# Patient Record
Sex: Female | Born: 1955
Health system: Southern US, Community
[De-identification: ages and names within clinical notes are randomized; demographics above are authoritative.]

## PROBLEM LIST (undated history)

## (undated) DIAGNOSIS — I10 Essential (primary) hypertension: Secondary | ICD-10-CM

## (undated) DIAGNOSIS — Z9889 Other specified postprocedural states: Secondary | ICD-10-CM

## (undated) DIAGNOSIS — M25511 Pain in right shoulder: Secondary | ICD-10-CM

## (undated) DIAGNOSIS — I2510 Atherosclerotic heart disease of native coronary artery without angina pectoris: Secondary | ICD-10-CM

## (undated) DIAGNOSIS — R112 Nausea with vomiting, unspecified: Secondary | ICD-10-CM

## (undated) HISTORY — DX: Essential (primary) hypertension: I10

## (undated) HISTORY — DX: Pain in right shoulder: M25.511

## (undated) HISTORY — PX: CARDIAC CATHETERIZATION: SHX172

---

## 1978-05-18 HISTORY — PX: VAGINAL HYSTERECTOMY: SUR661

## 1996-05-18 HISTORY — PX: TOE SURGERY: SHX1073

## 1998-05-18 HISTORY — PX: ANKLE SURGERY: SHX546

## 1998-05-22 ENCOUNTER — Ambulatory Visit (HOSPITAL_BASED_OUTPATIENT_CLINIC_OR_DEPARTMENT_OTHER): Admission: RE | Admit: 1998-05-22 | Discharge: 1998-05-22 | Payer: Self-pay | Admitting: Orthopedic Surgery

## 2002-08-15 ENCOUNTER — Encounter: Payer: Self-pay | Admitting: Internal Medicine

## 2002-08-15 ENCOUNTER — Encounter: Admission: RE | Admit: 2002-08-15 | Discharge: 2002-08-15 | Payer: Self-pay | Admitting: Internal Medicine

## 2004-08-25 ENCOUNTER — Encounter: Admission: RE | Admit: 2004-08-25 | Discharge: 2004-08-25 | Payer: Self-pay | Admitting: Internal Medicine

## 2005-02-20 ENCOUNTER — Ambulatory Visit: Payer: Self-pay | Admitting: Internal Medicine

## 2005-04-10 ENCOUNTER — Ambulatory Visit: Payer: Self-pay | Admitting: Internal Medicine

## 2005-08-26 ENCOUNTER — Encounter: Admission: RE | Admit: 2005-08-26 | Discharge: 2005-08-26 | Payer: Self-pay | Admitting: Internal Medicine

## 2006-03-02 ENCOUNTER — Ambulatory Visit: Payer: Self-pay | Admitting: Internal Medicine

## 2006-03-02 LAB — CONVERTED CEMR LAB
AST: 22 units/L (ref 0–37)
Albumin: 3.6 g/dL (ref 3.5–5.2)
Basophils Absolute: 0.1 10*3/uL (ref 0.0–0.1)
CO2: 27 meq/L (ref 19–32)
Creatinine, Ser: 0.9 mg/dL (ref 0.4–1.2)
Eosinophil percent: 1.7 % (ref 0.0–5.0)
Glomerular Filtration Rate, Af Am: 86 mL/min/{1.73_m2}
HCT: 36.8 % (ref 36.0–46.0)
HDL: 47.8 mg/dL (ref >39.0)
Hemoglobin: 12.5 g/dL (ref 12.0–15.0)
LDL Cholesterol: 94 mg/dL (ref 0–99)
MCHC: 33.9 g/dL (ref 30.0–36.0)
Monocytes Absolute: 0.4 10*3/uL (ref 0.2–0.7)
Neutro Abs: 2.8 10*3/uL (ref 1.4–7.7)
Neutrophils Relative %: 38.4 % — ABNORMAL LOW (ref 43.0–77.0)
RBC: 4.31 M/uL (ref 3.87–5.11)
Sodium: 140 meq/L (ref 135–145)
TSH: 1.38 microintl units/mL (ref 0.35–5.50)
Total Bilirubin: 0.7 mg/dL (ref 0.3–1.2)
Total Protein: 7.5 g/dL (ref 6.0–8.3)
Triglyceride fasting, serum: 174 mg/dL — ABNORMAL HIGH (ref 0–149)
VLDL: 35 mg/dL (ref 0–40)

## 2006-03-29 ENCOUNTER — Ambulatory Visit: Payer: Self-pay | Admitting: Internal Medicine

## 2006-05-04 ENCOUNTER — Ambulatory Visit: Payer: Self-pay | Admitting: Internal Medicine

## 2006-06-15 ENCOUNTER — Ambulatory Visit: Payer: Self-pay | Admitting: Internal Medicine

## 2006-12-06 ENCOUNTER — Ambulatory Visit: Payer: Self-pay | Admitting: Internal Medicine

## 2006-12-06 DIAGNOSIS — I10 Essential (primary) hypertension: Secondary | ICD-10-CM | POA: Insufficient documentation

## 2006-12-06 DIAGNOSIS — J309 Allergic rhinitis, unspecified: Secondary | ICD-10-CM | POA: Insufficient documentation

## 2007-01-28 ENCOUNTER — Ambulatory Visit: Payer: Self-pay | Admitting: Internal Medicine

## 2007-01-28 DIAGNOSIS — M25511 Pain in right shoulder: Secondary | ICD-10-CM | POA: Insufficient documentation

## 2007-02-07 ENCOUNTER — Encounter: Payer: Self-pay | Admitting: Internal Medicine

## 2007-02-28 ENCOUNTER — Encounter: Payer: Self-pay | Admitting: Internal Medicine

## 2007-04-21 ENCOUNTER — Telehealth: Payer: Self-pay | Admitting: Internal Medicine

## 2007-09-15 ENCOUNTER — Ambulatory Visit: Payer: Self-pay | Admitting: Internal Medicine

## 2007-12-15 ENCOUNTER — Ambulatory Visit: Payer: Self-pay | Admitting: Internal Medicine

## 2008-01-27 ENCOUNTER — Ambulatory Visit: Payer: Self-pay | Admitting: Internal Medicine

## 2008-05-31 ENCOUNTER — Encounter: Admission: RE | Admit: 2008-05-31 | Discharge: 2008-05-31 | Payer: Self-pay | Admitting: Internal Medicine

## 2008-06-07 ENCOUNTER — Ambulatory Visit: Payer: Self-pay | Admitting: Internal Medicine

## 2008-07-23 ENCOUNTER — Ambulatory Visit: Payer: Self-pay | Admitting: Internal Medicine

## 2008-07-23 LAB — CONVERTED CEMR LAB
AST: 17 units/L (ref 0–37)
Alkaline Phosphatase: 83 units/L (ref 39–117)
BUN: 9 mg/dL (ref 6–23)
Basophils Relative: 0.1 % (ref 0.0–3.0)
CO2: 31 meq/L (ref 19–32)
Cholesterol: 163 mg/dL (ref 0–200)
Direct LDL: 59 mg/dL
Eosinophils Absolute: 0.1 10*3/uL (ref 0.0–0.7)
GFR calc Af Amer: 98 mL/min
GFR calc non Af Amer: 81 mL/min
Glucose, Bld: 104 mg/dL — ABNORMAL HIGH (ref 70–99)
HCT: 36.9 % (ref 36.0–46.0)
Ketones, urine, test strip: NEGATIVE
MCHC: 34.6 g/dL (ref 30.0–36.0)
Monocytes Relative: 6.3 % (ref 3.0–12.0)
Neutrophils Relative %: 43.6 % (ref 43.0–77.0)
Nitrite: NEGATIVE
Potassium: 3.2 meq/L — ABNORMAL LOW (ref 3.5–5.1)
Protein, U semiquant: NEGATIVE
RDW: 12.2 % (ref 11.5–14.6)
Specific Gravity, Urine: 1.015
Total Bilirubin: 0.7 mg/dL (ref 0.3–1.2)
Total Protein: 7.4 g/dL (ref 6.0–8.3)
Triglycerides: 207 mg/dL (ref 0–149)
VLDL: 41 mg/dL — ABNORMAL HIGH (ref 0–40)
WBC Urine, dipstick: NEGATIVE
pH: 6

## 2008-07-27 ENCOUNTER — Ambulatory Visit: Payer: Self-pay | Admitting: Internal Medicine

## 2008-09-28 ENCOUNTER — Telehealth: Payer: Self-pay | Admitting: Internal Medicine

## 2008-09-28 ENCOUNTER — Telehealth: Payer: Self-pay | Admitting: Family Medicine

## 2008-10-01 ENCOUNTER — Ambulatory Visit: Payer: Self-pay | Admitting: Internal Medicine

## 2008-10-01 DIAGNOSIS — J069 Acute upper respiratory infection, unspecified: Secondary | ICD-10-CM | POA: Insufficient documentation

## 2008-10-08 ENCOUNTER — Ambulatory Visit: Payer: Self-pay | Admitting: Internal Medicine

## 2008-10-08 ENCOUNTER — Telehealth: Payer: Self-pay | Admitting: Family Medicine

## 2008-10-16 ENCOUNTER — Ambulatory Visit: Payer: Self-pay | Admitting: Internal Medicine

## 2008-10-22 ENCOUNTER — Telehealth: Payer: Self-pay | Admitting: Internal Medicine

## 2009-02-19 ENCOUNTER — Telehealth: Payer: Self-pay | Admitting: Internal Medicine

## 2009-04-18 ENCOUNTER — Ambulatory Visit: Payer: Self-pay | Admitting: Internal Medicine

## 2009-07-02 ENCOUNTER — Ambulatory Visit: Payer: Self-pay | Admitting: Internal Medicine

## 2009-07-04 ENCOUNTER — Telehealth: Payer: Self-pay | Admitting: Internal Medicine

## 2009-07-12 ENCOUNTER — Telehealth: Payer: Self-pay | Admitting: Family Medicine

## 2009-07-15 ENCOUNTER — Ambulatory Visit: Payer: Self-pay | Admitting: Internal Medicine

## 2009-08-02 ENCOUNTER — Encounter: Admission: RE | Admit: 2009-08-02 | Discharge: 2009-08-02 | Payer: Self-pay | Admitting: Internal Medicine

## 2009-08-02 LAB — HM MAMMOGRAPHY

## 2009-11-14 ENCOUNTER — Ambulatory Visit: Payer: Self-pay | Admitting: Internal Medicine

## 2009-12-09 ENCOUNTER — Ambulatory Visit: Payer: Self-pay | Admitting: Internal Medicine

## 2009-12-09 DIAGNOSIS — T6391XA Toxic effect of contact with unspecified venomous animal, accidental (unintentional), initial encounter: Secondary | ICD-10-CM | POA: Insufficient documentation

## 2010-03-19 ENCOUNTER — Telehealth: Payer: Self-pay | Admitting: Internal Medicine

## 2010-05-01 ENCOUNTER — Ambulatory Visit: Payer: Self-pay | Admitting: Internal Medicine

## 2010-06-08 ENCOUNTER — Encounter: Payer: Self-pay | Admitting: Internal Medicine

## 2010-06-17 NOTE — Progress Notes (Signed)
Summary: refill on Hydrocodone  Phone Note Call from Patient   Caller: Patient Call For: Gordy Savers  MD Summary of Call: Cough syrup refill from sinus congestion. [phone] [facility])  Initial call taken by: Lynann Beaver CMA AAMA,  March 19, 2010 9:13 AM  Follow-up for Phone Call        generic hydromet 6 oz one tsp every  6 hrs for cough Follow-up by: Gordy Savers  MD,  March 19, 2010 9:31 AM    Prescriptions: Romana Juniper 5-1.5 MG/5ML SYRP (HYDROCODONE-HOMATROPINE) 2 teaspoons every 6 hours as needed for cough  #6 oz x 0   Entered by:   Lynann Beaver CMA AAMA   Authorized by:   Gordy Savers  MD   Signed by:   Lynann Beaver CMA AAMA on 03/19/2010   Method used:   Telephoned to ...         RxID:   [id number]

## 2010-06-17 NOTE — Assessment & Plan Note (Signed)
Summary: sob/dm   Vital Signs:  Patient profile:   55 year old female Weight:      212 pounds O2 Sat:      98 % on Room air Temp:     98.4 degrees F oral BP sitting:   118 / 70  (right arm) Cuff size:   large  Vitals Entered By: Duard Brady LPN (July 15, 2009 10:40 AM)  O2 Flow:  Room air CC: c/o sob , cought, congestion, no improvement Is Patient Diabetic? No   CC:  c/o sob , cought, congestion, and no improvement.  History of Present Illness: 55 year old patient who is seen today for follow-up.  She was seen for a URI.  Two weeks ago and treated symptomatically.  Last week.  She called and was placed on azithromycin due to persistent cough.  She denies any fever or sputum production.  She does have a history of hypertension and allergic rhinitis.  Denies any shortness of breath or chest pain.  Preventive Screening-Counseling & Management  Alcohol-Tobacco     Smoking Status: never  Allergies: 1)  ! Sulfa  Family History: Reviewed history from 07/27/2008 and no changes required. Family History of CAD Female 1st degree relative <50 Family History Hypertension  two brothers one status post CVA at age 20;  positive for hypertension, hyperlipidemia  Review of Systems       The patient complains of prolonged cough.  The patient denies anorexia, fever, weight loss, weight gain, vision loss, decreased hearing, hoarseness, chest pain, syncope, dyspnea on exertion, peripheral edema, headaches, hemoptysis, abdominal pain, melena, severe indigestion/heartburn, hematuria, incontinence, genital sores, muscle weakness, suspicious skin lesions, transient blindness, difficulty walking, depression, unusual weight change, abnormal bleeding, enlarged lymph nodes, angioedema, and breast masses.    Physical Exam  General:  Well-developed,well-nourished,in no acute distress; alert,appropriate and cooperative throughout examination Head:  Normocephalic and atraumatic without obvious  abnormalities. No apparent alopecia or balding. Eyes:  No corneal or conjunctival inflammation noted. EOMI. Perrla. Funduscopic exam benign, without hemorrhages, exudates or papilledema. Vision grossly normal. Ears:  External ear exam shows no significant lesions or deformities.  Otoscopic examination reveals clear canals, tympanic membranes are intact bilaterally without bulging, retraction, inflammation or discharge. Hearing is grossly normal bilaterally. Nose:  External nasal examination shows no deformity or inflammation. Nasal mucosa are pink and moist without lesions or exudates. Mouth:  Oral mucosa and oropharynx without lesions or exudates.  Teeth in good repair. Neck:  No deformities, masses, or tenderness noted. Lungs:  Normal respiratory effort, chest expands symmetrically. Lungs are clear to auscultation, no crackles or wheezes. O2 saturation 98% Heart:  Normal rate and regular rhythm. S1 and S2 normal without gallop, murmur, click, rub or other extra sounds.   Impression & Recommendations:  Problem # 1:  URI (ICD-465.9)  Her updated medication list for this problem includes:    Allegra 180 Mg Tabs (Fexofenadine hcl) .Marland Kitchen... 1 once daily    Naproxen Dr 500 Mg Tbec (Naproxen) ..... One twice daily    Hydrocodone-homatropine 5-1.5 Mg/63ml Syrp (Hydrocodone-homatropine) .Marland Kitchen... 2 teaspoons every 6 hours as needed for cough  Her updated medication list for this problem includes:    Allegra 180 Mg Tabs (Fexofenadine hcl) .Marland Kitchen... 1 once daily    Naproxen Dr 500 Mg Tbec (Naproxen) ..... One twice daily    Hydrocodone-homatropine 5-1.5 Mg/43ml Syrp (Hydrocodone-homatropine) .Marland Kitchen... 2 teaspoons every 6 hours as needed for cough  Problem # 2:  HYPERTENSION (ICD-401.9)  Her updated medication list for  this problem includes:    Dyazide 37.5-25 Mg Caps (Triamterene-hctz) .Marland Kitchen... 1 qd  Her updated medication list for this problem includes:    Dyazide 37.5-25 Mg Caps (Triamterene-hctz) .Marland Kitchen... 1  qd  Problem # 3:  ALLERGIC RHINITIS (ICD-477.9)  Her updated medication list for this problem includes:    Nasacort Aq 55 Mcg/act Aers (Triamcinolone acetonide(nasal)) ..... Use once daily    Allegra 180 Mg Tabs (Fexofenadine hcl) .Marland Kitchen... 1 once daily  Her updated medication list for this problem includes:    Nasacort Aq 55 Mcg/act Aers (Triamcinolone acetonide(nasal)) ..... Use once daily    Allegra 180 Mg Tabs (Fexofenadine hcl) .Marland Kitchen... 1 once daily  Complete Medication List: 1)  Nasacort Aq 55 Mcg/act Aers (Triamcinolone acetonide(nasal)) .... Use once daily 2)  Premarin 0.625 Mg Tabs (Estrogens conjugated) .Marland Kitchen.. 1 qd 3)  Dyazide 37.5-25 Mg Caps (Triamterene-hctz) .Marland Kitchen.. 1 qd 4)  Allegra 180 Mg Tabs (Fexofenadine hcl) .Marland Kitchen.. 1 once daily 5)  Naproxen Dr 500 Mg Tbec (Naproxen) .... One twice daily 6)  Ed K+10 10 Meq Cr-tabs (Potassium chloride) .... One daily 7)  Hydrocodone-homatropine 5-1.5 Mg/20ml Syrp (Hydrocodone-homatropine) .... 2 teaspoons every 6 hours as needed for cough 8)  Prednisone 20 Mg Tabs (Prednisone) .... One twice daily  Patient Instructions: 1)  Get plenty of rest, drink lots of clear liquids, and use Tylenol or Ibuprofen for fever and comfort. Return in 7-10 days if you're not better:sooner if you're feeling worse. Prescriptions: HYDROCODONE-HOMATROPINE 5-1.5 MG/5ML SYRP (HYDROCODONE-HOMATROPINE) 2 teaspoons every 6 hours as needed for cough  #6 oz x 2   Entered and Authorized by:   Gordy Savers  MD   Signed by:   Gordy Savers  MD on 07/15/2009   Method used:   Print then Give to Patient   RxID:   [id number]

## 2010-06-17 NOTE — Progress Notes (Signed)
Summary: cough   Phone Note Call from Patient Call back at Work Phone [phone]   Caller: Patient Call For: Gordy Savers  MD Summary of Call: C/o cough from allergies. Unable to take Hycodan at work; can you recommend daytime cough syrup? [facility] Initial call taken by: Raechel Ache, RN,  July 04, 2009 8:56 AM  Follow-up for Phone Call        delsym  one tsp every 12 hrs Follow-up by: Gordy Savers  MD,  July 04, 2009 12:53 PM  Additional Follow-up for Phone Call Additional follow up Details #1::        Phone Call Completed Additional Follow-up by: Raechel Ache, RN,  July 04, 2009 1:41 PM

## 2010-06-17 NOTE — Assessment & Plan Note (Signed)
Summary: sinuses//ccm   Vital Signs:  Patient profile:   55 year old female Weight:      212 pounds O2 Sat:      98 % on Room air Temp:     98.7 degrees F oral BP sitting:   110 / 76  (right arm) Cuff size:   regular  Vitals Entered By: Duard Brady LPN (July 02, 2009 10:53 AM)  O2 Flow:  Room air CC: c/o sinus congestion, cough , sob   CC:  c/o sinus congestion, cough , and sob.  History of Present Illness: 55 year old patient who has a history of allergic rhinitis, who presents today for two day history of increasing sinus congestion and rhinorrhea.  There is been no fever or purulent drainage.  She has a significant nasal stuffiness sneezing, and feels that she has had a flare of her allergic rhinitis.  She is on maintenance Allegra, as well as a nasal steroid.  Allergies: 1)  ! Sulfa  Past History:  Past Medical History: Reviewed history from 01/27/2008 and no changes required. Allergic rhinitis Hypertension right shoulder pain  Review of Systems  The patient denies anorexia, fever, weight loss, weight gain, vision loss, decreased hearing, hoarseness, chest pain, syncope, dyspnea on exertion, peripheral edema, prolonged cough, headaches, hemoptysis, abdominal pain, melena, hematochezia, severe indigestion/heartburn, hematuria, incontinence, genital sores, muscle weakness, suspicious skin lesions, transient blindness, difficulty walking, depression, unusual weight change, abnormal bleeding, enlarged lymph nodes, angioedema, and breast masses.    Physical Exam  General:  Well-developed,well-nourished,in no acute distress; alert,appropriate and cooperative throughout examination Eyes:  No corneal or conjunctival inflammation noted. EOMI. Perrla. Funduscopic exam benign, without hemorrhages, exudates or papilledema. Vision grossly normal. Ears:  External ear exam shows no significant lesions or deformities.  Otoscopic examination reveals clear canals, tympanic  membranes are intact bilaterally without bulging, retraction, inflammation or discharge. Hearing is grossly normal bilaterally. Mouth:  Oral mucosa and oropharynx without lesions or exudates.  Teeth in good repair. Neck:  No deformities, masses, or tenderness noted. Lungs:  Normal respiratory effort, chest expands symmetrically. Lungs are clear to auscultation, no crackles or wheezes. O2 saturation 99% Heart:  Normal rate and regular rhythm. S1 and S2 normal without gallop, murmur, click, rub or other extra sounds. pulse rate  110 Extremities:  No clubbing, cyanosis, edema, or deformity noted with normal full range of motion of all joints.     Impression & Recommendations:  Problem # 1:  ALLERGIC RHINITIS (ICD-477.9)  Her updated medication list for this problem includes:    Nasacort Aq 55 Mcg/act Aers (Triamcinolone acetonide(nasal)) ..... Use once daily    Allegra 180 Mg Tabs (Fexofenadine hcl) .Marland Kitchen... 1 once daily  Her updated medication list for this problem includes:    Nasacort Aq 55 Mcg/act Aers (Triamcinolone acetonide(nasal)) ..... Use once daily    Allegra 180 Mg Tabs (Fexofenadine hcl) .Marland Kitchen... 1 once daily  Problem # 2:  HYPERTENSION (ICD-401.9)  Her updated medication list for this problem includes:    Dyazide 37.5-25 Mg Caps (Triamterene-hctz) .Marland Kitchen... 1 qd  Her updated medication list for this problem includes:    Dyazide 37.5-25 Mg Caps (Triamterene-hctz) .Marland Kitchen... 1 qd  Complete Medication List: 1)  Nasacort Aq 55 Mcg/act Aers (Triamcinolone acetonide(nasal)) .... Use once daily 2)  Premarin 0.625 Mg Tabs (Estrogens conjugated) .Marland Kitchen.. 1 qd 3)  Dyazide 37.5-25 Mg Caps (Triamterene-hctz) .Marland Kitchen.. 1 qd 4)  Allegra 180 Mg Tabs (Fexofenadine hcl) .Marland Kitchen.. 1 once daily 5)  Naproxen Dr 500 Mg  Tbec (Naproxen) .... One twice daily 6)  Ed K+10 10 Meq Cr-tabs (Potassium chloride) .... One daily 7)  Hydrocodone-homatropine 5-1.5 Mg/21ml Syrp (Hydrocodone-homatropine) .... 2 teaspoons every 6 hours  as needed for cough 8)  Prednisone 20 Mg Tabs (Prednisone) .... One twice daily  Other Orders: Depo- Medrol 80mg  (J1040) Admin of Therapeutic Inj  intramuscular or subcutaneous (04540)  Patient Instructions: 1)  Please schedule a follow-up appointment in 4 months. 2)  Limit your Sodium (Salt). Prescriptions: PREDNISONE 20 MG TABS (PREDNISONE) one twice daily  #14 x 0   Entered and Authorized by:   Gordy Savers  MD   Signed by:   Gordy Savers  MD on 07/02/2009   Method used:   Print then Give to Patient   RxID:   [id number]    Medication Administration  Injection # 1:    Medication: Depo- Medrol 80mg     Diagnosis: URI (ICD-465.9)    Route: IM    Site: R deltoid    Exp Date: 03/2012    Lot #: [id number]    Mfr: Pharmacia    Patient tolerated injection without complications  Orders Added: 1)  Est. Patient Level III [08657] 2)  Depo- Medrol 80mg  [J1040] 3)  Admin of Therapeutic Inj  intramuscular or subcutaneous [84696]

## 2010-06-17 NOTE — Progress Notes (Signed)
Summary: no better- congested  Phone Note Call from Patient Call back at Work Phone [phone]   Caller: Patient Call For: Gordy Savers  MD Summary of Call: No better- still very congested and coughing- has phlegm but can't cough it up. Out of Hycodan, using Delsym and not helping. More med or OV? No fever. [facility] Initial call taken by: Raechel Ache, RN,  July 12, 2009 8:24 AM  Follow-up for Phone Call        Start Zithromax (Z-pack) and take OTC plain mucinex and office f/u by next week if no better. Follow-up by: Evelena Peat MD,  July 12, 2009 8:32 AM  Additional Follow-up for Phone Call Additional follow up Details #1::        Rx Called In, patient notified Additional Follow-up by: Raechel Ache, RN,  July 12, 2009 8:41 AM

## 2010-06-17 NOTE — Assessment & Plan Note (Signed)
Summary: Wasp sting, painful swollen lt toe/nn   Vital Signs:  Patient profile:   55 year old female Weight:      207 pounds Temp:     98.2 degrees F oral BP sitting:   138 / 74  (right arm) Cuff size:   regular  Vitals Entered By: Duard Brady LPN (December 09, 2009 4:22 PM) CC: c/o wasp sting to (L) 2nd toe - red, swelling Is Patient Diabetic? No   CC:  c/o wasp sting to (L) 2nd toe - red and swelling.  History of Present Illness: 55 year old patient who was stung by a wasp on the second, third toe 3 days ago.  She has had persistent swelling and some discomfort.  No systemic symptoms.  She has treated hypertension, which has been stable on diuretic therapy.  She also has a history of allergic rhinitis  Allergies: 1)  ! Sulfa  Past History:  Past Medical History: Reviewed history from 01/27/2008 and no changes required. Allergic rhinitis Hypertension right shoulder pain  Physical Exam  General:  overweight-appearing.   Extremities:  soft tissue swelling along the dorsal aspect of the entire left third toe, plantar surface of the toe appeared normal.  There was slight tenderness   Impression & Recommendations:  Problem # 1:  BEE STING REACTION, LOCAL (ICD-989.5)  Problem # 2:  HYPERTENSION (ICD-401.9)  Her updated medication list for this problem includes:    Dyazide 37.5-25 Mg Caps (Triamterene-hctz) .Marland Kitchen... 1 qd  Complete Medication List: 1)  Nasacort Aq 55 Mcg/act Aers (Triamcinolone acetonide(nasal)) .... Use once daily 2)  Premarin 0.625 Mg Tabs (Estrogens conjugated) .Marland Kitchen.. 1 qd 3)  Dyazide 37.5-25 Mg Caps (Triamterene-hctz) .Marland Kitchen.. 1 qd 4)  Allegra 180 Mg Tabs (Fexofenadine hcl) .Marland Kitchen.. 1 once daily 5)  Naproxen Dr 500 Mg Tbec (Naproxen) .... One twice daily 6)  Ed K+10 10 Meq Cr-tabs (Potassium chloride) .... One daily 7)  Hydrocodone-homatropine 5-1.5 Mg/12ml Syrp (Hydrocodone-homatropine) .... 2 teaspoons every 6 hours as needed for cough  Patient  Instructions: 1)  Keep  the  left foot elevated as much as possible  2)  Take 400-600mg  of Ibuprofen (Advil, Motrin) with food every 4-6 hours as needed for relief of pain or comfort of fever.  Appended Document: Wasp sting, painful swollen lt toe/nn     Allergies: 1)  ! Sulfa   Complete Medication List: 1)  Nasacort Aq 55 Mcg/act Aers (Triamcinolone acetonide(nasal)) .... Use once daily 2)  Premarin 0.625 Mg Tabs (Estrogens conjugated) .Marland Kitchen.. 1 qd 3)  Dyazide 37.5-25 Mg Caps (Triamterene-hctz) .Marland Kitchen.. 1 qd 4)  Allegra 180 Mg Tabs (Fexofenadine hcl) .Marland Kitchen.. 1 once daily 5)  Naproxen Dr 500 Mg Tbec (Naproxen) .... One twice daily 6)  Ed K+10 10 Meq Cr-tabs (Potassium chloride) .... One daily 7)  Hydrocodone-homatropine 5-1.5 Mg/26ml Syrp (Hydrocodone-homatropine) .... 2 teaspoons every 6 hours as needed for cough  Other Orders: Depo- Medrol 80mg  (J1040) Admin of Therapeutic Inj  intramuscular or subcutaneous (16109)    Medication Administration  Injection # 1:    Medication: Depo- Medrol 80mg     Diagnosis: BEE STING REACTION, LOCAL (ICD-989.5)    Route: IM    Site: R deltoid    Exp Date: 08/2012    Lot #: [id number]    Mfr: Pharmacia    Patient tolerated injection without complications    Given by: Duard Brady LPN (December 10, 2009 8:02 AM)  Orders Added: 1)  Depo- Medrol 80mg  [J1040] 2)  Admin of Therapeutic  Inj  intramuscular or subcutaneous [16109]

## 2010-06-17 NOTE — Assessment & Plan Note (Signed)
Summary: 6 month rov/njr  A  way andand and a half and he is in no and no  Vital Signs:  Patient profile:   55 year old female Weight:      207 pounds Temp:     98.0 degrees F oral BP sitting:   120 / 76  (left arm) Cuff size:   regular CC: 6 mos rov - c/o (R) neck,shoulder and arm pain Is Patient Diabetic? No   CC:  6 mos rov - c/o (R) neck and shoulder and arm pain.  History of Present Illness: 55 year old patient who is seen today for follow-up.  She has a history of hypertension and allergic rhinitis.  She has a history of prior right shoulder pain for the past 4 weeks.  She has had right shoulder and arm pain and at times seems aggravated by movement of the head and neck. Since her last visit here.  She has lost 5 pounds in weight.  Preventive Screening-Counseling & Management  Alcohol-Tobacco     Smoking Status: never  Allergies: 1)  ! Sulfa  Past History:  Past Medical History: Reviewed history from 01/27/2008 and no changes required. Allergic rhinitis Hypertension right shoulder pain  Family History: Reviewed history from 07/27/2008 and no changes required. Family History of CAD Female 1st degree relative <50 Family History Hypertension  two brothers one status post CVA at age 52;  positive for hypertension, hyperlipidemia  Review of Systems  The patient denies anorexia, fever, weight loss, weight gain, vision loss, decreased hearing, hoarseness, chest pain, syncope, dyspnea on exertion, peripheral edema, prolonged cough, headaches, hemoptysis, abdominal pain, melena, hematochezia, severe indigestion/heartburn, hematuria, incontinence, genital sores, muscle weakness, suspicious skin lesions, transient blindness, difficulty walking, depression, unusual weight change, abnormal bleeding, enlarged lymph nodes, angioedema, and breast masses.    Physical Exam  General:  overweight-appearing.  110/74overweight-appearing.   Head:  Normocephalic and atraumatic without  obvious abnormalities. No apparent alopecia or balding. Eyes:  No corneal or conjunctival inflammation noted. EOMI. Perrla. Funduscopic exam benign, without hemorrhages, exudates or papilledema. Vision grossly normal. Ears:  External ear exam shows no significant lesions or deformities.  Otoscopic examination reveals clear canals, tympanic membranes are intact bilaterally without bulging, retraction, inflammation or discharge. Hearing is grossly normal bilaterally. Mouth:  Oral mucosa and oropharynx without lesions or exudates.  Teeth in good repair. Neck:  No deformities, masses, or tenderness noted. Lungs:  Normal respiratory effort, chest expands symmetrically. Lungs are clear to auscultation, no crackles or wheezes. Heart:  Normal rate and regular rhythm. S1 and S2 normal without gallop, murmur, click, rub or other extra sounds. Abdomen:  Bowel sounds positive,abdomen soft and non-tender without masses, organomegaly or hernias noted. Msk:  range of motion of the  right shoulder appear to be intact and did not tend to aggravate pain; restraint was normal.  Biceps and triceps reflex were suppressed, but symmetrical range of motion of the neck was slightly uncomfortable, but did not aggravate shoulder pain and there is no radicular symptoms   Impression & Recommendations:  Problem # 1:  SHOULDER PAIN, RIGHT, HX OF (ICD-V13.5)  Problem # 2:  HYPERTENSION (ICD-401.9)  Her updated medication list for this problem includes:    Dyazide 37.5-25 Mg Caps (Triamterene-hctz) .Marland Kitchen... 1 qd  Her updated medication list for this problem includes:    Dyazide 37.5-25 Mg Caps (Triamterene-hctz) .Marland Kitchen... 1 qd  Problem # 3:  ALLERGIC RHINITIS (ICD-477.9)  Her updated medication list for this problem includes:  Nasacort Aq 55 Mcg/act Aers (Triamcinolone acetonide(nasal)) ..... Use once daily    Allegra 180 Mg Tabs (Fexofenadine hcl) .Marland Kitchen... 1 once daily  Her updated medication list for this problem includes:     Nasacort Aq 55 Mcg/act Aers (Triamcinolone acetonide(nasal)) ..... Use once daily    Allegra 180 Mg Tabs (Fexofenadine hcl) .Marland Kitchen... 1 once daily  Complete Medication List: 1)  Nasacort Aq 55 Mcg/act Aers (Triamcinolone acetonide(nasal)) .... Use once daily 2)  Premarin 0.625 Mg Tabs (Estrogens conjugated) .Marland Kitchen.. 1 qd 3)  Dyazide 37.5-25 Mg Caps (Triamterene-hctz) .Marland Kitchen.. 1 qd 4)  Allegra 180 Mg Tabs (Fexofenadine hcl) .Marland Kitchen.. 1 once daily 5)  Naproxen Dr 500 Mg Tbec (Naproxen) .... One twice daily 6)  Ed K+10 10 Meq Cr-tabs (Potassium chloride) .... One daily 7)  Hydrocodone-homatropine 5-1.5 Mg/28ml Syrp (Hydrocodone-homatropine) .... 2 teaspoons every 6 hours as needed for cough  Patient Instructions: 1)  Please schedule a follow-up appointment in 6 months. 2)  Limit your Sodium (Salt). 3)  It is important that you exercise regularly at least 20 minutes 5 times a week. If you develop chest pain, have severe difficulty breathing, or feel very tired , stop exercising immediately and seek medical attention. 4)  You need to lose weight. Consider a lower calorie diet and regular exercise.  5)  Check your Blood Pressure regularly. If it is above: 150/90 you should make an appointment. Prescriptions: ED K+10 10 MEQ CR-TABS (POTASSIUM CHLORIDE) one daily  #90 x 6   Entered and Authorized by:   Gordy Savers  MD   Signed by:   Gordy Savers  MD on 11/14/2009   Method used:   Print then Give to Patient   RxID:   [id number] NAPROXEN DR 500 MG TBEC (NAPROXEN) one twice daily  #180 x 6   Entered and Authorized by:   Gordy Savers  MD   Signed by:   Gordy Savers  MD on 11/14/2009   Method used:   Print then Give to Patient   RxID:   [id number] ALLEGRA 180 MG  TABS (FEXOFENADINE HCL) 1 once daily  #90 x 6   Entered and Authorized by:   Gordy Savers  MD   Signed by:   Gordy Savers  MD on 11/14/2009   Method used:   Print then Give to Patient   RxID:    [id number] ATFTDDU 37.5-25 MG  CAPS (TRIAMTERENE-HCTZ) 1 qd  #90 x 6   Entered and Authorized by:   Gordy Savers  MD   Signed by:   Gordy Savers  MD on 11/14/2009   Method used:   Print then Give to Patient   RxID:   [id number]   Appended Document: 6 month rov/njr     Vitals Entered By: Duard Brady LPN (November 14, 2009 3:08 PM)  Allergies: 1)  ! Sulfa   Complete Medication List: 1)  Nasacort Aq 55 Mcg/act Aers (Triamcinolone acetonide(nasal)) .... Use once daily 2)  Premarin 0.625 Mg Tabs (Estrogens conjugated) .Marland Kitchen.. 1 qd 3)  Dyazide 37.5-25 Mg Caps (Triamterene-hctz) .Marland Kitchen.. 1 qd 4)  Allegra 180 Mg Tabs (Fexofenadine hcl) .Marland Kitchen.. 1 once daily 5)  Naproxen Dr 500 Mg Tbec (Naproxen) .... One twice daily 6)  Ed K+10 10 Meq Cr-tabs (Potassium chloride) .... One daily 7)  Hydrocodone-homatropine 5-1.5 Mg/84ml Syrp (Hydrocodone-homatropine) .... 2 teaspoons every 6 hours as needed for cough    Medication Administration  Injection # 1:  Medication: Depo- Medrol 80mg     Diagnosis: SHOULDER PAIN, RIGHT, HX OF (ICD-V13.5)    Route: IM    Site: R deltoid    Exp Date: 08-2012    Lot #: [id number]    Mfr: Pharmacia    Patient tolerated injection without complications    Given by: Duard Brady LPN (November 14, 2009 3:09 PM)

## 2010-06-19 NOTE — Assessment & Plan Note (Signed)
Summary: 6 month rov/njr---PT RSC (BMP) // RS   Vital Signs:  Patient profile:   55 year old female Weight:      210 pounds BP sitting:   120 / 78  (right arm) Cuff size:   regular  Vitals Entered By: Duard Brady LPN (May 01, 2010 3:19 PM) CC: 6 mos rov - c/o (R) shoulder pain Is Patient Diabetic? No   CC:  6 mos rov - c/o (R) shoulder pain.  History of Present Illness: 55 year old patient who is seen today for follow-up of her hypertension.  She has a history allergic rhinitis and intermittent right shoulder pain.  This has flared recently.  In general, she is doing quite well.  Allergies: 1)  ! Sulfa  Past History:  Past Medical History: Reviewed history from 01/27/2008 and no changes required. Allergic rhinitis Hypertension right shoulder pain  Review of Systems  The patient denies anorexia, fever, weight loss, weight gain, vision loss, decreased hearing, hoarseness, chest pain, syncope, dyspnea on exertion, peripheral edema, prolonged cough, headaches, hemoptysis, abdominal pain, melena, hematochezia, severe indigestion/heartburn, hematuria, incontinence, genital sores, muscle weakness, suspicious skin lesions, transient blindness, difficulty walking, depression, unusual weight change, abnormal bleeding, enlarged lymph nodes, angioedema, and breast masses.    Physical Exam  General:  overweight-appearing.  normal blood pressureoverweight-appearing.   Head:  Normocephalic and atraumatic without obvious abnormalities. No apparent alopecia or balding. Eyes:  No corneal or conjunctival inflammation noted. EOMI. Perrla. Funduscopic exam benign, without hemorrhages, exudates or papilledema. Vision grossly normal. Mouth:  Oral mucosa and oropharynx without lesions or exudates.  Teeth in good repair. Neck:  No deformities, masses, or tenderness noted. Lungs:  Normal respiratory effort, chest expands symmetrically. Lungs are clear to auscultation, no crackles or  wheezes. Heart:  Normal rate and regular rhythm. S1 and S2 normal without gallop, murmur, click, rub or other extra sounds. Abdomen:  Bowel sounds positive,abdomen soft and non-tender without masses, organomegaly or hernias noted. Msk:  No deformity or scoliosis noted of thoracic or lumbar spine.   Pulses:  R and L carotid,radial,femoral,dorsalis pedis and posterior tibial pulses are full and equal bilaterally Extremities:  No clubbing, cyanosis, edema, or deformity noted with normal full range of motion of all joints.     Impression & Recommendations:  Problem # 1:  SHOULDER PAIN, RIGHT, HX OF (ICD-V13.5)  Problem # 2:  HYPERTENSION (ICD-401.9)  Her updated medication list for this problem includes:    Dyazide 37.5-25 Mg Caps (Triamterene-hctz) .Marland Kitchen... 1 qd  Her updated medication list for this problem includes:    Dyazide 37.5-25 Mg Caps (Triamterene-hctz) .Marland Kitchen... 1 qd  Complete Medication List: 1)  Nasacort Aq 55 Mcg/act Aers (Triamcinolone acetonide(nasal)) .... Use once daily 2)  Premarin 0.625 Mg Tabs (Estrogens conjugated) .Marland Kitchen.. 1 qd 3)  Dyazide 37.5-25 Mg Caps (Triamterene-hctz) .Marland Kitchen.. 1 qd 4)  Allegra 180 Mg Tabs (Fexofenadine hcl) .Marland Kitchen.. 1 once daily 5)  Naproxen Dr 500 Mg Tbec (Naproxen) .... One twice daily 6)  Ed K+10 10 Meq Cr-tabs (Potassium chloride) .... One daily 7)  Hydrocodone-homatropine 5-1.5 Mg/18ml Syrp (Hydrocodone-homatropine) .... 2 teaspoons every 6 hours as needed for cough  Patient Instructions: 1)  Please schedule a follow-up appointment in 6 months for CPX  2)  Limit your Sodium (Salt). 3)  It is important that you exercise regularly at least 20 minutes 5 times a week. If you develop chest pain, have severe difficulty breathing, or feel very tired , stop exercising immediately and seek medical attention.  4)  You need to lose weight. Consider a lower calorie diet and regular exercise.  Prescriptions: HYDROCODONE-HOMATROPINE 5-1.5 MG/5ML SYRP  (HYDROCODONE-HOMATROPINE) 2 teaspoons every 6 hours as needed for cough  #6 oz x 0   Entered and Authorized by:   Gordy Savers  MD   Signed by:   Gordy Savers  MD on 05/01/2010   Method used:   Print then Give to Patient   RxID:   [id number] ED K+10 10 MEQ CR-TABS (POTASSIUM CHLORIDE) one daily  #90 x 6   Entered and Authorized by:   Gordy Savers  MD   Signed by:   Gordy Savers  MD on 05/01/2010   Method used:   Print then Give to Patient   RxID:   [id number] NAPROXEN DR 500 MG TBEC (NAPROXEN) one twice daily  #180 x 6   Entered and Authorized by:   Gordy Savers  MD   Signed by:   Gordy Savers  MD on 05/01/2010   Method used:   Print then Give to Patient   RxID:   [id number] ALLEGRA 180 MG  TABS (FEXOFENADINE HCL) 1 once daily  #90 x 6   Entered and Authorized by:   Gordy Savers  MD   Signed by:   Gordy Savers  MD on 05/01/2010   Method used:   Print then Give to Patient   RxID:   [id number] DYAZIDE 37.5-25 MG  CAPS (TRIAMTERENE-HCTZ) 1 qd  #90 x 6   Entered and Authorized by:   Gordy Savers  MD   Signed by:   Gordy Savers  MD on 05/01/2010   Method used:   Print then Give to Patient   RxID:   [id number] PREMARIN 0.625 MG  TABS (ESTROGENS CONJUGATED) 1 qd  #90 x 4   Entered and Authorized by:   Gordy Savers  MD   Signed by:   Gordy Savers  MD on 05/01/2010   Method used:   Print then Give to Patient   RxID:   [id number] NASACORT AQ 55 MCG/ACT AERS (TRIAMCINOLONE ACETONIDE(NASAL)) use once daily  #3 x 6   Entered and Authorized by:   Gordy Savers  MD   Signed by:   Gordy Savers  MD on 05/01/2010   Method used:   Print then Give to Patient   RxID:   [id number]    Orders Added: 1)  Est. Patient Level III [62831]

## 2010-08-29 ENCOUNTER — Encounter: Payer: Self-pay | Admitting: Internal Medicine

## 2010-08-29 ENCOUNTER — Ambulatory Visit (INDEPENDENT_AMBULATORY_CARE_PROVIDER_SITE_OTHER): Payer: 59 | Admitting: Internal Medicine

## 2010-08-29 DIAGNOSIS — R059 Cough, unspecified: Secondary | ICD-10-CM

## 2010-08-29 DIAGNOSIS — R05 Cough: Secondary | ICD-10-CM

## 2010-08-29 DIAGNOSIS — J309 Allergic rhinitis, unspecified: Secondary | ICD-10-CM

## 2010-08-29 DIAGNOSIS — I10 Essential (primary) hypertension: Secondary | ICD-10-CM

## 2010-08-29 MED ORDER — TRIAMCINOLONE ACETONIDE(NASAL) 55 MCG/ACT NA INHA: 2.0000 | Freq: Every day | NASAL | Status: DC

## 2010-08-29 MED ORDER — MOMETASONE FURO-FORMOTEROL FUM 100-5 MCG/ACT IN AERO: 1.0000 "application " | INHALATION_SPRAY | Freq: Two times a day (BID) | RESPIRATORY_TRACT | Status: DC

## 2010-08-29 MED ORDER — HYDROCODONE-HOMATROPINE 5-1.5 MG/5ML PO SYRP: 2.5000 mL | ORAL_SOLUTION | Freq: Four times a day (QID) | ORAL | Status: DC | PRN

## 2010-08-29 NOTE — Progress Notes (Signed)
  Subjective:    Patient ID: Morgan Wolfe, female    DOB: 1955-08-30, 55 y.o.   MRN: [id number]  HPI  is a 55 year old patient who has a history of hypertension and allergic rhinitis. For the past 2 days she has had some increasing cough nasal congestion and chest congestion. Cough has been largely nonproductive there is been no wheezing. Her chief complaint is refractory cough there has been some postnasal drip. Maintenance medication include both Allegra and Nasacort    Review of Systems  Constitutional: Negative.   HENT: Positive for congestion, rhinorrhea and postnasal drip. Negative for hearing loss, sore throat, dental problem, sinus pressure and tinnitus.   Eyes: Negative for pain, discharge and visual disturbance.  Respiratory: Positive for cough. Negative for shortness of breath.   Cardiovascular: Negative for chest pain, palpitations and leg swelling.  Gastrointestinal: Negative for nausea, vomiting, abdominal pain, diarrhea, constipation, blood in stool and abdominal distention.  Genitourinary: Negative for dysuria, urgency, frequency, hematuria, flank pain, vaginal bleeding, vaginal discharge, difficulty urinating, vaginal pain and pelvic pain.  Musculoskeletal: Negative for joint swelling, arthralgias and gait problem.  Skin: Negative for rash.  Neurological: Negative for dizziness, syncope, speech difficulty, weakness, numbness and headaches.  Hematological: Negative for adenopathy.  Psychiatric/Behavioral: Negative for behavioral problems, dysphoric mood and agitation. The patient is not nervous/anxious.        Objective:   Physical Exam  Constitutional: She is oriented to person, place, and time. She appears well-developed and well-nourished. No distress.  HENT:  Head: Normocephalic and atraumatic.  Right Ear: External ear normal.  Left Ear: External ear normal.  Nose: Nose normal.  Mouth/Throat: Oropharynx is clear and moist.  Eyes: Conjunctivae and EOM are normal.  Pupils are equal, round, and reactive to light. Left eye exhibits no discharge.  Neck: Normal range of motion. Neck supple. No thyromegaly present.  Cardiovascular: Normal rate, regular rhythm, normal heart sounds and intact distal pulses.   Pulmonary/Chest: Effort normal and breath sounds normal. No respiratory distress. She has no wheezes. She has no rales.  Abdominal: Soft. Bowel sounds are normal. She exhibits no mass. There is no tenderness.  Musculoskeletal: Normal range of motion.  Lymphadenopathy:    She has no cervical adenopathy.  Neurological: She is alert and oriented to person, place, and time.  Skin: Skin is warm and dry. No rash noted.  Psychiatric: She has a normal mood and affect. Her behavior is normal.          Assessment & Plan:   Allergic rhinitis flare. We'll treat with Depo-Medrol. She also has some lower airway symptoms and will place on Dulera 1 puff twice daily. She'll be maintained on Nasacort and Allegra. Her cough medicine also refilled

## 2010-08-29 NOTE — Patient Instructions (Signed)
Get plenty of rest, Drink lots of  clear liquids, and use Tylenol or ibuprofen for fever and discomfort.    Call or return to clinic prn if these symptoms worsen or fail to improve as anticipated.  Please check your blood pressure on a regular basis.  If it is consistently greater than 150/90, please make an office appointment.    

## 2010-10-03 NOTE — Assessment & Plan Note (Signed)
[facility]                            [facility] OFFICE NOTE   NAME:Morgan Wolfe, Morgan Wolfe                       MRN:          [id number]  DATE:03/29/2006                            DOB:          1955-12-28    A 55 year old African-American female who comes in today for a wellness  exam.  She has a history of hypertension, menopausal syndrome.  She is  status post hysterectomy.  She has been off of diuretic therapy for  about 1 month.  She has done well.  Last month had a couple episodes of  self-limiting vertigo.   PAST MEDICAL HISTORY:  In 1998 she had toe surgery.  In 2000, ankle  surgery by Dr. Lajoyce Corners.  Has had a hysterectomy for fibroids by Dr. Tenny Craw in  the past.  She has seasonal allergic rhinitis.   SULFA allergy.   EXAM:  An overweight female.  Blood pressure 150/90.  FUNDI, EARS, NOSE, AND THROAT:  Clear.  NECK:  No bruits.  CHEST:  Clear.  CARDIOVASCULAR:  Normal heart sounds.  No murmurs.  BREASTS:  Negative.  ABDOMEN:  Obese, soft, and nontender.  No organomegaly.  EXTREMITIES:  Negative.   IMPRESSION:  1. Hypertension.  2. Menopausal syndrome.   DISPOSITION:  Compliance stressed.  She was resumed on diuretic therapy.  In view of some mild hypokalemia was switched to Diazide.  We will  recheck her blood pressure in 4 to 6 weeks.     Gordy Savers, MD  Electronically Signed    PFK/MedQ  DD: 03/29/2006  DT: 03/29/2006  Job #: [id number]

## 2010-10-24 ENCOUNTER — Other Ambulatory Visit: Payer: Self-pay

## 2010-10-31 ENCOUNTER — Encounter: Payer: Self-pay | Admitting: Internal Medicine

## 2010-12-01 ENCOUNTER — Other Ambulatory Visit: Payer: Self-pay | Admitting: Internal Medicine

## 2010-12-01 NOTE — Telephone Encounter (Signed)
Pt called 7/16. Usually gets her pills through the mail. Sent in the order but hasn't received her estrogen yet, and she is out. She wants to know if it can be called in to the [facility] on [address] instead. I told her I did not know if that could be done, but that I would put the info to the nurse.

## 2010-12-03 ENCOUNTER — Encounter: Payer: Self-pay | Admitting: Internal Medicine

## 2010-12-03 ENCOUNTER — Ambulatory Visit (INDEPENDENT_AMBULATORY_CARE_PROVIDER_SITE_OTHER): Payer: 59 | Admitting: Internal Medicine

## 2010-12-03 DIAGNOSIS — I10 Essential (primary) hypertension: Secondary | ICD-10-CM

## 2010-12-04 ENCOUNTER — Encounter: Payer: Self-pay | Admitting: Internal Medicine

## 2010-12-04 NOTE — Patient Instructions (Signed)
Call or return to clinic prn if these symptoms worsen or fail to improve as anticipated.

## 2010-12-04 NOTE — Progress Notes (Signed)
  Subjective:    Patient ID: Morgan Wolfe, female    DOB: [birth year], 55 y.o.   MRN: [id number]  HPI 55 year old patient who presents complaining of numbness involving her right index fingertip there is been no obvious trauma there is numbness distal to the left second DIP joint. There is no pain and is able to flex and extend the digit without difficulty. She has treated hypertension which has been stable more of a 369 and he will call her and her   Review of Systems     Objective:   Physical Exam  Constitutional: She appears well-developed and well-nourished. No distress.  Musculoskeletal:       There is mild subjective numbness involving the left second fingertip; seemed to be  more prominent on the palmar side. Flexion and extension of the finger was normal there were no ischemic changes and radial and ulnar pulses were normal          Assessment & Plan:  Paresthesias left second fingertip. Unclear etiology. This has been present just for a few days we'll clinically observe. She will call if she develops any worsening symptoms

## 2011-01-01 ENCOUNTER — Other Ambulatory Visit: Payer: Self-pay | Admitting: Internal Medicine

## 2011-01-01 DIAGNOSIS — Z1231 Encounter for screening mammogram for malignant neoplasm of breast: Secondary | ICD-10-CM

## 2011-01-05 ENCOUNTER — Ambulatory Visit
Admission: RE | Admit: 2011-01-05 | Discharge: 2011-01-05 | Disposition: A | Discharge status: Home / Self Care | Payer: 59 | Source: Ambulatory Visit | Attending: Internal Medicine | Admitting: Internal Medicine

## 2011-01-05 ENCOUNTER — Other Ambulatory Visit (INDEPENDENT_AMBULATORY_CARE_PROVIDER_SITE_OTHER): Payer: 59

## 2011-01-05 DIAGNOSIS — Z0000 Encounter for general adult medical examination without abnormal findings: Secondary | ICD-10-CM

## 2011-01-05 DIAGNOSIS — Z1231 Encounter for screening mammogram for malignant neoplasm of breast: Secondary | ICD-10-CM

## 2011-01-05 LAB — CBC WITH DIFFERENTIAL/PLATELET
Eosinophils Absolute: 0.2 10*3/uL (ref 0.0–0.7)
HCT: 36.3 % (ref 36.0–46.0)
Hemoglobin: 12.1 g/dL (ref 12.0–15.0)
Lymphocytes Relative: 54.2 % — ABNORMAL HIGH (ref 12.0–46.0)
Lymphs Abs: 4 10*3/uL (ref 0.7–4.0)
MCHC: 33.3 g/dL (ref 30.0–36.0)
Monocytes Absolute: 0.5 10*3/uL (ref 0.1–1.0)
Monocytes Relative: 6.6 % (ref 3.0–12.0)
Neutrophils Relative %: 35.3 % — ABNORMAL LOW (ref 43.0–77.0)
Platelets: 312 10*3/uL (ref 150.0–400.0)

## 2011-01-05 LAB — POCT URINALYSIS DIPSTICK
Leukocytes, UA: NEGATIVE
Nitrite, UA: NEGATIVE
Protein, UA: NEGATIVE
Urobilinogen, UA: 0.2
pH, UA: 5

## 2011-01-05 LAB — TSH: TSH: 1.33 u[IU]/mL (ref 0.35–5.50)

## 2011-01-05 LAB — HEPATIC FUNCTION PANEL
AST: 18 U/L (ref 0–37)
Albumin: 3.5 g/dL (ref 3.5–5.2)
Alkaline Phosphatase: 58 U/L (ref 39–117)
Bilirubin, Direct: 0 mg/dL (ref 0.0–0.3)
Total Protein: 7.1 g/dL (ref 6.0–8.3)

## 2011-01-05 LAB — LIPID PANEL: Triglycerides: 224 mg/dL — ABNORMAL HIGH (ref 0.0–149.0)

## 2011-01-05 LAB — BASIC METABOLIC PANEL
BUN: 13 mg/dL (ref 6–23)
CO2: 29 mEq/L (ref 19–32)
Calcium: 8.6 mg/dL (ref 8.4–10.5)
Chloride: 103 mEq/L (ref 96–112)
Potassium: 3.3 mEq/L — ABNORMAL LOW (ref 3.5–5.1)

## 2011-01-05 LAB — LDL CHOLESTEROL, DIRECT: Direct LDL: 52.9 mg/dL

## 2011-01-10 ENCOUNTER — Encounter: Payer: Self-pay | Admitting: Family Medicine

## 2011-01-10 ENCOUNTER — Ambulatory Visit (INDEPENDENT_AMBULATORY_CARE_PROVIDER_SITE_OTHER): Payer: 59 | Admitting: Family Medicine

## 2011-01-10 VITALS — BP 126/76 | Temp 98.0°F | Wt 207.1 lb

## 2011-01-10 DIAGNOSIS — IMO0002 Reserved for concepts with insufficient information to code with codable children: Secondary | ICD-10-CM

## 2011-01-10 DIAGNOSIS — R109 Unspecified abdominal pain: Secondary | ICD-10-CM

## 2011-01-10 DIAGNOSIS — S76019A Strain of muscle, fascia and tendon of unspecified hip, initial encounter: Secondary | ICD-10-CM

## 2011-01-10 MED ORDER — NAPROXEN 500 MG PO TABS: 500.0000 mg | ORAL_TABLET | Freq: Two times a day (BID) | ORAL | Status: DC

## 2011-01-10 NOTE — Progress Notes (Signed)
  Subjective:    Patient ID: Morgan Wolfe, female    DOB: [birth year], 55 y.o.   MRN: [id number]  HPI  Morgan Wolfe, a 55 y.o. female presents today in the office for the following:    "Abdominal Pain" On vacation this week. Did not do anything tht she can recall. Thursday morning was hurting a little bit. Better with walking.   Eating and drinking OK. No GI symptoms.  Does not feel sick, and a little uncomfortable with driving a car.  No fever, chills, sweats.  Normal BM, urination, no BRBPR, no melena.   Points to R lower oblique / hip flexor insertion on the R No known injury, but started to ache this week   The PMH, PSH, Social History, Family History, Medications, and allergies have been reviewed in [facility], and have been updated if relevant.  Review of Systems ROS: GEN: No acute illnesses, no fevers, chills. GI: No n/v/d, eating normally Pulm: No SOB Interactive and getting along well at home.  Otherwise, ROS is as per the HPI.     Objective:   Physical Exam   Physical Exam  Blood pressure 126/76, temperature 98 F (36.7 C), temperature source Oral, weight 207 lb 1.3 oz (93.931 kg).  GEN: WDWN, NAD, Non-toxic, A & O x 3 HEENT: Atraumatic, Normocephalic. Neck supple. No masses, No LAD. Ears and Nose: No external deformity. ABD: S, NT, ND, +BS. No rebound tenderness. No HSM.  MSK: mildly exacerbated with resisted ecccentric lower with rectus and oblique contraction. Resisted hip flexion causes mild pain. With hip in EROM, greatest pain. 4/5 str in this case. EXTR: No c/c/e NEURO Normal gait.  PSYCH: Normally interactive. Conversant. Not depressed or anxious appearing.  Calm demeanor.        Assessment & Plan:   1. Strain of hip flexor  naproxen (NAPROSYN) 500 MG tablet   Reassured patient. Clinically no abdominal pathology MSK Hip flexor strain Reviewed basic rehab Naprosyn bid x 2 weeks

## 2011-01-10 NOTE — Patient Instructions (Signed)
Hip Rehab:  Hip Flexion: Toe up to ceiling, laying on your back. Lift your whole leg, 3 sets. Work up to being able to do #30 with each set.  Hip elevations, Toe and leg turned out to side.  Lift whole leg, 3 sets. Work up to being able to do #30 with each set.

## 2011-01-14 ENCOUNTER — Encounter: Payer: 59 | Admitting: Internal Medicine

## 2011-01-26 ENCOUNTER — Ambulatory Visit (INDEPENDENT_AMBULATORY_CARE_PROVIDER_SITE_OTHER): Payer: 59 | Admitting: Internal Medicine

## 2011-01-26 ENCOUNTER — Encounter: Payer: Self-pay | Admitting: Internal Medicine

## 2011-01-26 ENCOUNTER — Other Ambulatory Visit: Payer: Self-pay

## 2011-01-26 VITALS — BP 116/78 | HR 76 | Temp 98.1°F | Resp 18 | Wt 212.0 lb

## 2011-01-26 DIAGNOSIS — IMO0002 Reserved for concepts with insufficient information to code with codable children: Secondary | ICD-10-CM

## 2011-01-26 DIAGNOSIS — S76019A Strain of muscle, fascia and tendon of unspecified hip, initial encounter: Secondary | ICD-10-CM

## 2011-01-26 DIAGNOSIS — I10 Essential (primary) hypertension: Secondary | ICD-10-CM

## 2011-01-26 DIAGNOSIS — Z23 Encounter for immunization: Secondary | ICD-10-CM

## 2011-01-26 DIAGNOSIS — Z0000 Encounter for general adult medical examination without abnormal findings: Secondary | ICD-10-CM

## 2011-01-26 MED ORDER — ESTROGENS CONJUGATED 0.625 MG PO TABS: 0.6250 mg | ORAL_TABLET | Freq: Every day | ORAL | Status: DC

## 2011-01-26 MED ORDER — POTASSIUM CHLORIDE 10 MEQ PO TBCR: 10.0000 meq | EXTENDED_RELEASE_TABLET | Freq: Two times a day (BID) | ORAL | Status: DC

## 2011-01-26 MED ORDER — TRIAMTERENE-HCTZ 37.5-25 MG PO CAPS: 1.0000 | ORAL_CAPSULE | ORAL | Status: DC

## 2011-01-26 MED ORDER — TRIAMCINOLONE ACETONIDE(NASAL) 55 MCG/ACT NA INHA: 2.0000 | Freq: Every day | NASAL | Status: DC

## 2011-01-26 MED ORDER — NAPROXEN 500 MG PO TABS: 500.0000 mg | ORAL_TABLET | Freq: Two times a day (BID) | ORAL | Status: DC

## 2011-01-26 MED ORDER — MOMETASONE FURO-FORMOTEROL FUM 100-5 MCG/ACT IN AERO: 2.0000 | INHALATION_SPRAY | Freq: Two times a day (BID) | RESPIRATORY_TRACT | Status: DC

## 2011-01-26 NOTE — Telephone Encounter (Signed)
Sent to [facility] 

## 2011-01-26 NOTE — Progress Notes (Signed)
Subjective:    Patient ID: Morgan Wolfe, female    DOB: Feb 23, 1956, 55 y.o.   MRN: [id number]  HPI  History of Present Illness:   55 year-old patient seen today for a health examination. She has a history of hypertension, allergic rhinitis. She has done remarkably well. No concerns or complaints today. No prior colonoscopy now at age 55. Did have a mammogram last month. He is status post hysterectomy for benign fibroids.   Hypertension History:  Positive major cardiovascular risk factors include hypertension. Negative major cardiovascular risk factors include female age less than 9 years old and non-tobacco-user status.   Problems Prior to Update:  1) Shoulder Pain, Right, Hx of (ICD-V13.5)  2) Family History of Cad Female 1st Degree Relative <50 (ICD-V17.3)  3) Hypertension (ICD-401.9)  4) Allergic Rhinitis (ICD-477.9)   Medications Prior to Update:  1) Nasacort Aq 55 Mcg/act Aers (Triamcinolone Acetonide(Nasal)) .... Use Once Daily  2) Premarin 0.625 Mg Tabs (Estrogens Conjugated) .Marland Kitchen.. 1 Qd  3) Dyazide 37.5-25 Mg Caps (Triamterene-Hctz) .Marland Kitchen.. 1 Qd  4) Allegra 180 Mg Tabs (Fexofenadine Hcl) .Marland Kitchen.. 1 Once Daily  5) Naproxen Dr 500 Mg Tbec (Naproxen) .... One Twice Daily   Allergies:  1) ! Sulfa   Past History  Past Medical History:  Allergic rhinitis  Hypertension  right shoulder pain (01/27/2008)   Family History:  Family History of CAD Female 1st degree relative <50  Family History Hypertension  (12/06/2006)  Risk Factors:  Alcohol Use: N/A  >5 drinks/d w/in last 3 months: N/A  Caffeine Use: N/A  Diet: N/A  Exercise: N/A   Family History:  Reviewed history from 12/06/2006 and no changes required:  Family History of CAD Female 1st degree relative <50  Family History Hypertension  two brothers one status post CVA at age 26;  positive for hypertension, hyperlipidemia      Review of Systems  Constitutional: Negative for fever, appetite change, fatigue and unexpected  weight change.  HENT: Negative for hearing loss, ear pain, nosebleeds, congestion, sore throat, mouth sores, trouble swallowing, neck stiffness, dental problem, voice change, sinus pressure and tinnitus.   Eyes: Negative for photophobia, pain, redness and visual disturbance.  Respiratory: Negative for cough, chest tightness and shortness of breath.   Cardiovascular: Negative for chest pain, palpitations and leg swelling.  Gastrointestinal: Negative for nausea, vomiting, abdominal pain, diarrhea, constipation, blood in stool, abdominal distention and rectal pain.  Genitourinary: Negative for dysuria, urgency, frequency, hematuria, flank pain, vaginal bleeding, vaginal discharge, difficulty urinating, genital sores, vaginal pain, menstrual problem and pelvic pain.  Musculoskeletal: Negative for back pain and arthralgias.       Complains of some right groin discomfort and some generalized arthralgias  Skin: Negative for rash.  Neurological: Negative for dizziness, syncope, speech difficulty, weakness, light-headedness, numbness and headaches.  Hematological: Negative for adenopathy. Does not bruise/bleed easily.  Psychiatric/Behavioral: Negative for suicidal ideas, behavioral problems, self-injury, dysphoric mood and agitation. The patient is not nervous/anxious.        Objective:   Physical Exam  Constitutional: She is oriented to person, place, and time. She appears well-developed and well-nourished.  HENT:  Head: Normocephalic and atraumatic.  Right Ear: External ear normal.  Left Ear: External ear normal.  Mouth/Throat: Oropharynx is clear and moist.  Eyes: Conjunctivae and EOM are normal.  Neck: Normal range of motion. Neck supple. No JVD present. No thyromegaly present.  Cardiovascular: Normal rate, regular rhythm, normal heart sounds and intact distal pulses.   No murmur  heard. Pulmonary/Chest: Effort normal and breath sounds normal. She has no wheezes. She has no rales.  Abdominal:  Soft. Bowel sounds are normal. She exhibits no distension and no mass. There is no tenderness. There is no rebound and no guarding.  Musculoskeletal: Normal range of motion. She exhibits no edema and no tenderness.  Neurological: She is alert and oriented to person, place, and time. She has normal reflexes. No cranial nerve deficit. She exhibits normal muscle tone. Coordination normal.  Skin: Skin is warm and dry. No rash noted.  Psychiatric: She has a normal mood and affect. Her behavior is normal.          Assessment & Plan:    Preventive health Hypertension stable Exogenous obesity  We'll set up for screening colonoscopy Laboratory studies reviewed Recheck in 6 months

## 2011-01-26 NOTE — Patient Instructions (Signed)
Limit your sodium (Salt) intake    It is important that you exercise regularly, at least 20 minutes 3 to 4 times per week.  If you develop chest pain or shortness of breath seek  medical attention.  You need to lose weight.  Consider a lower calorie diet and regular exercise.  Return in 6 months for follow-up   

## 2011-04-20 ENCOUNTER — Telehealth: Payer: Self-pay

## 2011-04-20 MED ORDER — HYDROCODONE-HOMATROPINE 5-1.5 MG/5ML PO SYRP: 5.0000 mL | ORAL_SOLUTION | Freq: Four times a day (QID) | ORAL | Status: DC | PRN

## 2011-04-20 NOTE — Telephone Encounter (Signed)
Pt called and stated she is having the same problem that she had a while back with her sinuses.  Pt states she is taking cough syrup, nasal spray and she is still coughing.  Pt states she feels like she has a knot in her chest.  Pls advise.

## 2011-04-20 NOTE — Telephone Encounter (Signed)
Hydromet 6 ounces 1 teaspoon  every 6 hours as needed for cough and congestion 

## 2011-04-20 NOTE — Telephone Encounter (Signed)
rx sent to pharmacy

## 2011-04-21 ENCOUNTER — Ambulatory Visit (INDEPENDENT_AMBULATORY_CARE_PROVIDER_SITE_OTHER): Payer: 59 | Admitting: Internal Medicine

## 2011-04-21 ENCOUNTER — Encounter: Payer: Self-pay | Admitting: Internal Medicine

## 2011-04-21 DIAGNOSIS — I10 Essential (primary) hypertension: Secondary | ICD-10-CM

## 2011-04-21 DIAGNOSIS — J309 Allergic rhinitis, unspecified: Secondary | ICD-10-CM

## 2011-04-21 MED ORDER — BENZONATATE 200 MG PO CAPS: 200.0000 mg | ORAL_CAPSULE | Freq: Three times a day (TID) | ORAL | Status: AC | PRN

## 2011-04-21 MED ORDER — METHYLPREDNISOLONE ACETATE 80 MG/ML IJ SUSP
80.0000 mg | Freq: Once | INTRAMUSCULAR | Status: AC
  Administered 2011-04-21: 80 mg via INTRAMUSCULAR

## 2011-04-21 NOTE — Progress Notes (Signed)
  Subjective:    Patient ID: Morgan Wolfe, female    DOB: Feb 03, 1956, 55 y.o.   MRN: [id number]  HPI  55 year old patient who has a history of allergic rhinitis and asthma she presents with an eight-day history of significant head and chest congestion and productive cough. Cough is incessant and she is having a difficult time sleeping in spite of hydrocodone antidepressant medication. She remains on nasal and pulmonary inhalational medications. There's been no active wheezing. Cough is only minimally productive    Review of Systems  Constitutional: Positive for fatigue. Negative for fever and chills.  HENT: Positive for congestion and sinus pressure. Negative for hearing loss, sore throat, rhinorrhea, dental problem and tinnitus.   Eyes: Negative for pain, discharge and visual disturbance.  Respiratory: Positive for cough, chest tightness and shortness of breath. Negative for wheezing.   Cardiovascular: Negative for chest pain, palpitations and leg swelling.  Gastrointestinal: Negative for nausea, vomiting, abdominal pain, diarrhea, constipation, blood in stool and abdominal distention.  Genitourinary: Negative for dysuria, urgency, frequency, hematuria, flank pain, vaginal bleeding, vaginal discharge, difficulty urinating, vaginal pain and pelvic pain.  Musculoskeletal: Negative for joint swelling, arthralgias and gait problem.  Skin: Negative for rash.  Neurological: Negative for dizziness, syncope, speech difficulty, weakness, numbness and headaches.  Hematological: Negative for adenopathy.  Psychiatric/Behavioral: Negative for behavioral problems, dysphoric mood and agitation. The patient is not nervous/anxious.        Objective:   Physical Exam  Constitutional: She is oriented to person, place, and time. She appears well-developed and well-nourished.  HENT:  Head: Normocephalic.  Right Ear: External ear normal.  Left Ear: External ear normal.  Mouth/Throat: Oropharynx is clear and  moist.  Eyes: Conjunctivae and EOM are normal. Pupils are equal, round, and reactive to light.  Neck: Normal range of motion. Neck supple. No thyromegaly present.  Cardiovascular: Normal rate, regular rhythm, normal heart sounds and intact distal pulses.   Pulmonary/Chest: Effort normal and breath sounds normal. No respiratory distress. She has no wheezes. She has no rales.       Frequent paroxysms of coughing  Abdominal: Soft. Bowel sounds are normal. She exhibits no mass. There is no tenderness.  Musculoskeletal: Normal range of motion.  Lymphadenopathy:    She has no cervical adenopathy.  Neurological: She is alert and oriented to person, place, and time.  Skin: Skin is warm and dry. No rash noted.  Psychiatric: She has a normal mood and affect. Her behavior is normal.          Assessment & Plan:   Viral URI in the setting of a very atopic individual with refractory cough. Will treat with Depo-Medrol and increase Dulera to  200 mcg per inhalation; will switch to Tussionex for better cough control Allergic rhinitis

## 2011-04-21 NOTE — Patient Instructions (Addendum)
Get plenty of rest, Drink lots of  clear liquids, and use Tylenol or ibuprofen for fever and discomfort.    Call or return to clinic prn if these symptoms worsen or fail to improve as anticipated.  

## 2011-04-21 NOTE — Progress Notes (Signed)
Addended by: Duard Brady I on: 04/21/2011 12:12 PM   Modules accepted: Orders

## 2011-06-15 ENCOUNTER — Ambulatory Visit (INDEPENDENT_AMBULATORY_CARE_PROVIDER_SITE_OTHER): Payer: 59 | Admitting: Internal Medicine

## 2011-06-15 ENCOUNTER — Encounter: Payer: Self-pay | Admitting: Internal Medicine

## 2011-06-15 DIAGNOSIS — I10 Essential (primary) hypertension: Secondary | ICD-10-CM

## 2011-06-15 DIAGNOSIS — J309 Allergic rhinitis, unspecified: Secondary | ICD-10-CM

## 2011-06-15 DIAGNOSIS — J069 Acute upper respiratory infection, unspecified: Secondary | ICD-10-CM

## 2011-06-15 MED ORDER — AZITHROMYCIN 250 MG PO TABS: ORAL_TABLET | ORAL | Status: AC

## 2011-06-15 NOTE — Patient Instructions (Signed)
Get plenty of rest, Drink lots of  clear liquids, and use Tylenol or ibuprofen for fever and discomfort.    Call or return to clinic prn if these symptoms worsen or fail to improve as anticipated.  Antibiotic therapy was prescribed for the child due to specific medical indications.

## 2011-06-15 NOTE — Progress Notes (Signed)
  Subjective:    Patient ID: Morgan Wolfe, female    DOB: [birth year], 56 y.o.   MRN: [id number]  HPI   56 year old patient  who presents with a 3 day history of head and chest congestion cough fever and chills. Fevers as high as 101 in spite of taking Advil. She has been treated symptomatically with cough medicines as well as Advil. She is on some maintenance of medications that includes [employer] as well as Nasonex.  Cough is largely nonproductive    Review of Systems  Constitutional: Positive for fever, chills and fatigue.  HENT: Positive for congestion. Negative for hearing loss, sore throat, rhinorrhea, dental problem, sinus pressure and tinnitus.   Eyes: Negative for pain, discharge and visual disturbance.  Respiratory: Positive for cough. Negative for shortness of breath and wheezing.   Cardiovascular: Negative for chest pain, palpitations and leg swelling.  Gastrointestinal: Negative for nausea, vomiting, abdominal pain, diarrhea, constipation, blood in stool and abdominal distention.  Genitourinary: Negative for dysuria, urgency, frequency, hematuria, flank pain, vaginal bleeding, vaginal discharge, difficulty urinating, vaginal pain and pelvic pain.  Musculoskeletal: Negative for joint swelling, arthralgias and gait problem.  Skin: Negative for rash.  Neurological: Negative for dizziness, syncope, speech difficulty, weakness, numbness and headaches.  Hematological: Negative for adenopathy.  Psychiatric/Behavioral: Negative for behavioral problems, dysphoric mood and agitation. The patient is not nervous/anxious.        Objective:   Physical Exam  Constitutional: She is oriented to person, place, and time. She appears well-developed and well-nourished.  HENT:  Head: Normocephalic.  Right Ear: External ear normal.  Left Ear: External ear normal.  Mouth/Throat: Oropharynx is clear and moist.  Eyes: Conjunctivae and EOM are normal. Pupils are equal, round, and reactive to  light.  Neck: Normal range of motion. Neck supple. No thyromegaly present.  Cardiovascular: Normal rate, regular rhythm, normal heart sounds and intact distal pulses.   Pulmonary/Chest: Effort normal. She has rales.       Rales involving the left mid and left lower lung fields  Abdominal: Soft. Bowel sounds are normal. She exhibits no mass. There is no tenderness.  Musculoskeletal: Normal range of motion.  Lymphadenopathy:    She has no cervical adenopathy.  Neurological: She is alert and oriented to person, place, and time.  Skin: Skin is warm and dry. No rash noted.  Psychiatric: She has a normal mood and affect. Her behavior is normal.          Assessment & Plan:   Possible left lower lobe pneumonia. We'll treat with a Zithromax. We'll continue symptomatic treatment with anti-tussive medicines Continue maintenance bronchodilators Hypertension stable

## 2011-06-25 ENCOUNTER — Telehealth: Payer: Self-pay | Admitting: *Deleted

## 2011-06-25 ENCOUNTER — Ambulatory Visit (INDEPENDENT_AMBULATORY_CARE_PROVIDER_SITE_OTHER)
Admission: RE | Admit: 2011-06-25 | Discharge: 2011-06-25 | Disposition: A | Discharge status: Home / Self Care | Payer: 59 | Source: Ambulatory Visit | Attending: Internal Medicine | Admitting: Internal Medicine

## 2011-06-25 ENCOUNTER — Ambulatory Visit (INDEPENDENT_AMBULATORY_CARE_PROVIDER_SITE_OTHER): Payer: 59 | Admitting: Internal Medicine

## 2011-06-25 ENCOUNTER — Encounter: Payer: Self-pay | Admitting: Internal Medicine

## 2011-06-25 DIAGNOSIS — J069 Acute upper respiratory infection, unspecified: Secondary | ICD-10-CM

## 2011-06-25 DIAGNOSIS — J309 Allergic rhinitis, unspecified: Secondary | ICD-10-CM

## 2011-06-25 DIAGNOSIS — I10 Essential (primary) hypertension: Secondary | ICD-10-CM

## 2011-06-25 MED ORDER — HYDROCODONE-HOMATROPINE 5-1.5 MG/5ML PO SYRP: 5.0000 mL | ORAL_SOLUTION | Freq: Four times a day (QID) | ORAL | Status: DC | PRN

## 2011-06-25 NOTE — Patient Instructions (Signed)
Get plenty of rest, Drink lots of  clear liquids, and use Tylenol or ibuprofen for fever and discomfort.    Call or return to clinic prn if these symptoms worsen or fail to improve as anticipated.  Chest x-ray as discussed

## 2011-06-25 NOTE — Progress Notes (Signed)
Quick Note:  Pt aware chest x-ray normal. ______

## 2011-06-25 NOTE — Progress Notes (Signed)
  Subjective:    Patient ID: Morgan Wolfe, female    DOB: 07-21-1955, 56 y.o.   MRN: [id number]  HPI  56 year old patient who has a one week ago with the URI type symptoms. At that time clinical exam revealed rales in the left mid and lower lung field and she was treated empirically with azithromycin for possible left-sided pneumonia. She presents today complaining of persistent cough and congestion. Her fever has resolved but she still has cough that is largely nonproductive she complains of weakness fatigue and chest congestion. There has been perhaps low-grade fever. She has a history of allergic rhinitis and treated hypertension.    Review of Systems  Constitutional: Positive for fever, activity change, appetite change and fatigue.  HENT: Positive for rhinorrhea. Negative for hearing loss, congestion, sore throat, dental problem, sinus pressure and tinnitus.   Eyes: Negative for pain, discharge and visual disturbance.  Respiratory: Positive for cough. Negative for shortness of breath.   Cardiovascular: Negative for chest pain, palpitations and leg swelling.  Gastrointestinal: Negative for nausea, vomiting, abdominal pain, diarrhea, constipation, blood in stool and abdominal distention.  Genitourinary: Negative for dysuria, urgency, frequency, hematuria, flank pain, vaginal bleeding, vaginal discharge, difficulty urinating, vaginal pain and pelvic pain.  Musculoskeletal: Negative for joint swelling, arthralgias and gait problem.  Skin: Negative for rash.  Neurological: Negative for dizziness, syncope, speech difficulty, weakness, numbness and headaches.  Hematological: Negative for adenopathy.  Psychiatric/Behavioral: Negative for behavioral problems, dysphoric mood and agitation. The patient is not nervous/anxious.        Objective:   Physical Exam  Constitutional: She is oriented to person, place, and time. She appears well-developed and well-nourished. No distress.  HENT:  Head:  Normocephalic.  Right Ear: External ear normal.  Left Ear: External ear normal.  Mouth/Throat: Oropharynx is clear and moist.  Eyes: Conjunctivae and EOM are normal. Pupils are equal, round, and reactive to light.  Neck: Normal range of motion. Neck supple. No thyromegaly present.  Cardiovascular: Normal rate, regular rhythm, normal heart sounds and intact distal pulses.   Pulmonary/Chest: Effort normal and breath sounds normal.       Chest is now clear  Frequent paroxysms of coughing  Abdominal: Soft. Bowel sounds are normal. She exhibits no mass. There is no tenderness.  Musculoskeletal: Normal range of motion.  Lymphadenopathy:    She has no cervical adenopathy.  Neurological: She is alert and oriented to person, place, and time.  Skin: Skin is warm and dry. No rash noted.  Psychiatric: She has a normal mood and affect. Her behavior is normal.          Assessment & Plan:    Probable viral URI. Chest examination now is normal. We'll check a chest x-ray to exclude pneumonia We'll continue symptomatic treatment

## 2011-06-25 NOTE — Telephone Encounter (Signed)
Pt. Wants Dr. Kirtland Bouchard to know she still has a productive cough, and feels terrible, and cannot sleep due to the cough.  Cough syrup is not helping.  Positve for wheezing.  Scheduled appt with Dr. Kirtland Bouchard.

## 2011-06-29 ENCOUNTER — Encounter: Payer: Self-pay | Admitting: Internal Medicine

## 2011-06-29 ENCOUNTER — Ambulatory Visit (INDEPENDENT_AMBULATORY_CARE_PROVIDER_SITE_OTHER): Payer: 59 | Admitting: Internal Medicine

## 2011-06-29 DIAGNOSIS — J069 Acute upper respiratory infection, unspecified: Secondary | ICD-10-CM

## 2011-06-29 DIAGNOSIS — I10 Essential (primary) hypertension: Secondary | ICD-10-CM

## 2011-06-29 MED ORDER — DOXYCYCLINE HYCLATE 100 MG PO TABS: 100.0000 mg | ORAL_TABLET | Freq: Two times a day (BID) | ORAL | Status: AC

## 2011-06-29 NOTE — Progress Notes (Signed)
  Subjective:    Patient ID: Morgan Wolfe, female    DOB: 1955/08/02, 56 y.o.   MRN: [id number]  HPI  56 year old patient who is seen today for followup. She has not done well for several weeks. She seemed to improve but more recently has developed worsening productive cough now yielding green sputum she's developed a worsening shortness of breath and general sense of unwellness. She has not been able to work due to to the cough and illness. Denies any fever. A chest x-ray was obtained one week ago and was negative for pneumonia    Review of Systems  Constitutional: Positive for fatigue.  HENT: Negative for hearing loss, congestion, sore throat, rhinorrhea, dental problem, sinus pressure and tinnitus.   Eyes: Negative for pain, discharge and visual disturbance.  Respiratory: Positive for cough. Negative for shortness of breath.   Cardiovascular: Negative for chest pain, palpitations and leg swelling.  Gastrointestinal: Negative for nausea, vomiting, abdominal pain, diarrhea, constipation, blood in stool and abdominal distention.  Genitourinary: Negative for dysuria, urgency, frequency, hematuria, flank pain, vaginal bleeding, vaginal discharge, difficulty urinating, vaginal pain and pelvic pain.  Musculoskeletal: Negative for joint swelling, arthralgias and gait problem.  Skin: Negative for rash.  Neurological: Positive for weakness. Negative for dizziness, syncope, speech difficulty, numbness and headaches.  Hematological: Negative for adenopathy.  Psychiatric/Behavioral: Negative for behavioral problems, dysphoric mood and agitation. The patient is not nervous/anxious.        Objective:   Physical Exam  Constitutional: She is oriented to person, place, and time. She appears well-developed and well-nourished.  HENT:  Head: Normocephalic.  Right Ear: External ear normal.  Left Ear: External ear normal.  Mouth/Throat: Oropharynx is clear and moist.  Eyes: Conjunctivae and EOM are  normal. Pupils are equal, round, and reactive to light.  Neck: Normal range of motion. Neck supple. No thyromegaly present.  Cardiovascular: Normal rate, regular rhythm, normal heart sounds and intact distal pulses.   Pulmonary/Chest: Effort normal and breath sounds normal.  Abdominal: Soft. Bowel sounds are normal. She exhibits no mass. There is no tenderness.  Musculoskeletal: Normal range of motion.  Lymphadenopathy:    She has no cervical adenopathy.  Neurological: She is alert and oriented to person, place, and time.  Skin: Skin is warm and dry. No rash noted.  Psychiatric: She has a normal mood and affect. Her behavior is normal.          Assessment & Plan:    Viral URI/bronchitis. The patient has been treated symptomatically for a URI and now has developed worsening productive cough. May have a bacterial superinfection. We'll treat with doxycycline for 7 days and continue symptomatic treatment

## 2011-06-29 NOTE — Patient Instructions (Signed)
Take your antibiotic as prescribed until ALL of it is gone, but stop if you develop a rash, swelling, or any side effects of the medication.  Contact our office as soon as possible if  there are side effects of the medication.  Call or return to clinic prn if these symptoms worsen or fail to improve as anticipated.  

## 2011-07-02 ENCOUNTER — Telehealth: Payer: Self-pay | Admitting: *Deleted

## 2011-07-02 NOTE — Telephone Encounter (Signed)
Pt is calling for another week off work per her work's request?

## 2011-07-02 NOTE — Telephone Encounter (Signed)
ROV 07-06-11 to reassess

## 2011-07-02 NOTE — Telephone Encounter (Signed)
Please advise 

## 2011-07-03 NOTE — Telephone Encounter (Signed)
Left message to call for appt on 07/06/2011.

## 2011-07-06 ENCOUNTER — Encounter: Payer: Self-pay | Admitting: Internal Medicine

## 2011-07-06 ENCOUNTER — Ambulatory Visit (INDEPENDENT_AMBULATORY_CARE_PROVIDER_SITE_OTHER): Payer: 59 | Admitting: Internal Medicine

## 2011-07-06 VITALS — BP 120/80 | Temp 98.1°F | Wt 210.0 lb

## 2011-07-06 DIAGNOSIS — J069 Acute upper respiratory infection, unspecified: Secondary | ICD-10-CM

## 2011-07-06 NOTE — Progress Notes (Signed)
  Subjective:    Patient ID: Morgan Wolfe, female    DOB: [birth year], 56 y.o.   MRN: [id number]  HPI  56 year old patient who is seen today for followup of a URI. She was seen 7 days ago and treated for exacerbation with worsening cough yielding green sputum she was treated with doxycycline for a possible bacterial superinfection. She feels well today but still has cough. She states that her employer does not want her to return to work until her cough has resolved. She feels well without fever she does have some mild dyspnea on exertion    Review of Systems  Constitutional: Negative.   HENT: Negative for hearing loss, congestion, sore throat, rhinorrhea, dental problem, sinus pressure and tinnitus.   Eyes: Negative for pain, discharge and visual disturbance.  Respiratory: Positive for cough. Negative for shortness of breath.   Cardiovascular: Negative for chest pain, palpitations and leg swelling.  Gastrointestinal: Negative for nausea, vomiting, abdominal pain, diarrhea, constipation, blood in stool and abdominal distention.  Genitourinary: Negative for dysuria, urgency, frequency, hematuria, flank pain, vaginal bleeding, vaginal discharge, difficulty urinating, vaginal pain and pelvic pain.  Musculoskeletal: Negative for joint swelling, arthralgias and gait problem.  Skin: Negative for rash.  Neurological: Negative for dizziness, syncope, speech difficulty, weakness, numbness and headaches.  Hematological: Negative for adenopathy.  Psychiatric/Behavioral: Negative for behavioral problems, dysphoric mood and agitation. The patient is not nervous/anxious.        Objective:   Physical Exam  Constitutional: She is oriented to person, place, and time. She appears well-developed and well-nourished.       Occasional paroxysms of coughing during the examination  HENT:  Head: Normocephalic.  Right Ear: External ear normal.  Left Ear: External ear normal.  Mouth/Throat: Oropharynx is clear  and moist.  Eyes: Conjunctivae and EOM are normal. Pupils are equal, round, and reactive to light.  Neck: Normal range of motion. Neck supple. No thyromegaly present.  Cardiovascular: Normal rate, regular rhythm, normal heart sounds and intact distal pulses.   Pulmonary/Chest: Effort normal and breath sounds normal.  Abdominal: Soft. Bowel sounds are normal. She exhibits no mass. There is no tenderness.  Musculoskeletal: Normal range of motion.  Lymphadenopathy:    She has no cervical adenopathy.  Neurological: She is alert and oriented to person, place, and time.  Skin: Skin is warm and dry. No rash noted.  Psychiatric: She has a normal mood and affect. Her behavior is normal.          Assessment & Plan:   Viral tracheobronchitis. The patient is aware that the average duration of cough in the setting is approximately 17 days and may last even longer.  She generally feels well and was also made aware that she is noninfectious. Nevertheless she states that she is unable to return to work until her cough has sufficiently resolved  Hypertension stable

## 2011-07-06 NOTE — Patient Instructions (Signed)
Get plenty of rest, Drink lots of  clear liquids, and use Tylenol or ibuprofen for fever and discomfort.    Please check your blood pressure on a regular basis.  If it is consistently greater than 150/90, please make an office appointment.  Return in 6 months for follow-up

## 2011-07-14 DIAGNOSIS — Z0279 Encounter for issue of other medical certificate: Secondary | ICD-10-CM

## 2011-07-27 ENCOUNTER — Ambulatory Visit: Payer: 59 | Admitting: Internal Medicine

## 2011-08-22 ENCOUNTER — Other Ambulatory Visit: Payer: Self-pay | Admitting: Internal Medicine

## 2011-12-18 ENCOUNTER — Encounter: Payer: Self-pay | Admitting: Internal Medicine

## 2011-12-18 ENCOUNTER — Telehealth: Payer: Self-pay | Admitting: Internal Medicine

## 2011-12-18 ENCOUNTER — Ambulatory Visit (INDEPENDENT_AMBULATORY_CARE_PROVIDER_SITE_OTHER): Payer: 59 | Admitting: Internal Medicine

## 2011-12-18 VITALS — BP 120/82 | Temp 98.2°F | Wt 212.0 lb

## 2011-12-18 DIAGNOSIS — I10 Essential (primary) hypertension: Secondary | ICD-10-CM

## 2011-12-18 DIAGNOSIS — IMO0002 Reserved for concepts with insufficient information to code with codable children: Secondary | ICD-10-CM

## 2011-12-18 DIAGNOSIS — Z1211 Encounter for screening for malignant neoplasm of colon: Secondary | ICD-10-CM

## 2011-12-18 DIAGNOSIS — S76019A Strain of muscle, fascia and tendon of unspecified hip, initial encounter: Secondary | ICD-10-CM

## 2011-12-18 DIAGNOSIS — J309 Allergic rhinitis, unspecified: Secondary | ICD-10-CM

## 2011-12-18 MED ORDER — NAPROXEN 500 MG PO TABS: 500.0000 mg | ORAL_TABLET | Freq: Two times a day (BID) | ORAL | Status: DC

## 2011-12-18 MED ORDER — FEXOFENADINE HCL 180 MG PO TABS: 180.0000 mg | ORAL_TABLET | Freq: Every day | ORAL | Status: DC

## 2011-12-18 MED ORDER — ESTROGENS CONJUGATED 0.625 MG PO TABS: 0.6250 mg | ORAL_TABLET | Freq: Every day | ORAL | Status: DC

## 2011-12-18 MED ORDER — TRIAMCINOLONE ACETONIDE(NASAL) 55 MCG/ACT NA INHA: 2.0000 | Freq: Every day | NASAL | Status: DC

## 2011-12-18 MED ORDER — TRIAMTERENE-HCTZ 37.5-25 MG PO CAPS: 1.0000 | ORAL_CAPSULE | Freq: Every day | ORAL | Status: DC

## 2011-12-18 NOTE — Telephone Encounter (Signed)
Pt said that when she was in for an ov with Dr Amador Cunas last year, the doctor mentioned pt getting a Endoscopy/colon with Biopsy. Pt has not had this done. Pls re-order if necessary and contact pt.

## 2011-12-18 NOTE — Telephone Encounter (Signed)
Please advise 

## 2011-12-18 NOTE — Patient Instructions (Signed)

## 2011-12-18 NOTE — Telephone Encounter (Signed)
Referral done

## 2011-12-18 NOTE — Progress Notes (Signed)
  Subjective:    Patient ID: Morgan Wolfe, female    DOB: July 26, 1955, 56 y.o.   MRN: [id number]  HPI  56 year old patient who has hypertension and allergic rhinitis who is seen today for her six-month followup. She is doing fairly well does have some thumb shoulder and low back pain. This seems fairly minor. She is using ibuprofen. Her blood pressure has done quite well she does have a history of allergic rhinitis which has been stable.    Review of Systems  Constitutional: Negative.   HENT: Negative for hearing loss, congestion, sore throat, rhinorrhea, dental problem, sinus pressure and tinnitus.   Eyes: Negative for pain, discharge and visual disturbance.  Respiratory: Negative for cough and shortness of breath.   Cardiovascular: Negative for chest pain, palpitations and leg swelling.  Gastrointestinal: Negative for nausea, vomiting, abdominal pain, diarrhea, constipation, blood in stool and abdominal distention.  Genitourinary: Negative for dysuria, urgency, frequency, hematuria, flank pain, vaginal bleeding, vaginal discharge, difficulty urinating, vaginal pain and pelvic pain.  Musculoskeletal: Negative for joint swelling, arthralgias and gait problem.  Skin: Negative for rash.  Neurological: Negative for dizziness, syncope, speech difficulty, weakness, numbness and headaches.  Hematological: Negative for adenopathy.  Psychiatric/Behavioral: Negative for behavioral problems, dysphoric mood and agitation. The patient is not nervous/anxious.        Objective:   Physical Exam  Constitutional: She is oriented to person, place, and time. She appears well-developed and well-nourished.  HENT:  Head: Normocephalic.  Right Ear: External ear normal.  Left Ear: External ear normal.  Mouth/Throat: Oropharynx is clear and moist.  Eyes: Conjunctivae and EOM are normal. Pupils are equal, round, and reactive to light.  Neck: Normal range of motion. Neck supple. No thyromegaly present.    Cardiovascular: Normal rate, regular rhythm, normal heart sounds and intact distal pulses.   Pulmonary/Chest: Effort normal and breath sounds normal.  Abdominal: Soft. Bowel sounds are normal. She exhibits no mass. There is no tenderness.  Musculoskeletal: Normal range of motion.  Lymphadenopathy:    She has no cervical adenopathy.  Neurological: She is alert and oriented to person, place, and time.  Skin: Skin is warm and dry. No rash noted.  Psychiatric: She has a normal mood and affect. Her behavior is normal.          Assessment & Plan:   Hypertension well controlled we'll continue present regimen Allergic rhinitis stable Shoulder pain. Will refill naproxen  Recheck 6 months

## 2011-12-18 NOTE — Telephone Encounter (Signed)
Yes please schedule screening colonoscopy

## 2011-12-22 ENCOUNTER — Encounter: Payer: Self-pay | Admitting: Gastroenterology

## 2012-01-01 ENCOUNTER — Ambulatory Visit: Payer: 59 | Admitting: Internal Medicine

## 2012-01-28 ENCOUNTER — Encounter: Payer: Self-pay | Admitting: Gastroenterology

## 2012-01-28 ENCOUNTER — Ambulatory Visit (AMBULATORY_SURGERY_CENTER): Payer: 59 | Admitting: *Deleted

## 2012-01-28 VITALS — Ht 66.0 in | Wt 207.6 lb

## 2012-01-28 DIAGNOSIS — Z1211 Encounter for screening for malignant neoplasm of colon: Secondary | ICD-10-CM

## 2012-01-28 MED ORDER — NA SULFATE-K SULFATE-MG SULF 17.5-3.13-1.6 GM/177ML PO SOLN: ORAL | Status: DC

## 2012-02-12 ENCOUNTER — Ambulatory Visit (AMBULATORY_SURGERY_CENTER): Payer: 59 | Admitting: Gastroenterology

## 2012-02-12 ENCOUNTER — Encounter: Payer: Self-pay | Admitting: Gastroenterology

## 2012-02-12 VITALS — BP 130/77 | HR 70 | Temp 98.6°F | Resp 18 | Ht 66.0 in | Wt 207.0 lb

## 2012-02-12 DIAGNOSIS — D126 Benign neoplasm of colon, unspecified: Secondary | ICD-10-CM

## 2012-02-12 DIAGNOSIS — Z1211 Encounter for screening for malignant neoplasm of colon: Secondary | ICD-10-CM

## 2012-02-12 MED ORDER — SODIUM CHLORIDE 0.9 % IV SOLN: 500.0000 mL | INTRAVENOUS | Status: DC

## 2012-02-12 NOTE — Patient Instructions (Signed)
Colon polyps x 3 removed today, see handout given. Result letter in your mail in 1-2 weeks. Resume current medications. Call us with any questions or concerns. Thank you!!!   YOU HAD AN ENDOSCOPIC PROCEDURE TODAY AT THE [facility]: Refer to the procedure report that was given to you for any specific questions about what was found during the examination.  If the procedure report does not answer your questions, please call your gastroenterologist to clarify.  If you requested that your care partner not be given the details of your procedure findings, then the procedure report has been included in a sealed envelope for you to review at your convenience later.  YOU SHOULD EXPECT: Some feelings of bloating in the abdomen. Passage of more gas than usual.  Walking can help get rid of the air that was put into your GI tract during the procedure and reduce the bloating. If you had a lower endoscopy (such as a colonoscopy or flexible sigmoidoscopy) you may notice spotting of blood in your stool or on the toilet paper. If you underwent a bowel prep for your procedure, then you may not have a normal bowel movement for a few days.  DIET: Your first meal following the procedure should be a light meal and then it is ok to progress to your normal diet.  A half-sandwich or bowl of soup is an example of a good first meal.  Heavy or fried foods are harder to digest and may make you feel nauseous or bloated.  Likewise meals heavy in dairy and vegetables can cause extra gas to form and this can also increase the bloating.  Drink plenty of fluids but you should avoid alcoholic beverages for 24 hours.  ACTIVITY: Your care partner should take you home directly after the procedure.  You should plan to take it easy, moving slowly for the rest of the day.  You can resume normal activity the day after the procedure however you should NOT DRIVE or use heavy machinery for 24 hours (because of the sedation medicines used  during the test).    SYMPTOMS TO REPORT IMMEDIATELY: A gastroenterologist can be reached at any hour.  During normal business hours, 8:30 AM to 5:00 PM Monday through Friday, call [phone].  After hours and on weekends, please call the GI answering service at [phone] who will take a message and have the physician on call contact you.   Following lower endoscopy (colonoscopy or flexible sigmoidoscopy):  Excessive amounts of blood in the stool  Significant tenderness or worsening of abdominal pains  Swelling of the abdomen that is new, acute  Fever of 100F or higher  FOLLOW UP: If any biopsies were taken you will be contacted by phone or by letter within the next 1-3 weeks.  Call your gastroenterologist if you have not heard about the biopsies in 3 weeks.  Our staff will call the home number listed on your records the next business day following your procedure to check on you and address any questions or concerns that you may have at that time regarding the information given to you following your procedure. This is a courtesy call and so if there is no answer at the home number and we have not heard from you through the emergency physician on call, we will assume that you have returned to your regular daily activities without incident.  SIGNATURES/CONFIDENTIALITY: You and/or your care partner have signed paperwork which will be entered into your electronic medical record.  These signatures attest to the fact that that the information above on your After Visit Summary has been reviewed and is understood.  Full responsibility of the confidentiality of this discharge information lies with you and/or your care-partner.  

## 2012-02-12 NOTE — Op Note (Signed)
[facility] [address] [place], [address]   COLONOSCOPY PROCEDURE REPORT  PATIENT: Morgan Wolfe, Morgan Wolfe  MR#: [id number] BIRTHDATE: 05-Apr-1956 , 56  yrs. old GENDER: Female ENDOSCOPIST: Louis Meckel, MD REFERRED [id number] Amador Cunas, M.D. PROCEDURE DATE:  02/12/2012 PROCEDURE:   Colonoscopy with snare polypectomy and Colonoscopy with cold biopsy polypectomy ASA CLASS:   Class II INDICATIONS:average risk screening. MEDICATIONS: MAC sedation, administered by CRNA and Propofol (Diprivan) 320 mg IV  DESCRIPTION OF PROCEDURE:   After the risks benefits and alternatives of the procedure were thoroughly explained, informed consent was obtained.  A digital rectal exam revealed no abnormalities of the rectum.   The LB [id number]  endoscope was introduced through the anus and advanced to the cecum, which was identified by both the appendix and ileocecal valve. No adverse events experienced.   The quality of the prep was Suprep excellent The instrument was then slowly withdrawn as the colon was fully examined.      COLON FINDINGS: A sessile polyp measuring 3 mm in size was found.  A polypectomy was performed with a cold snare.  The resection was complete and the polyp tissue was completely retrieved.   2 diminutive 1-2 mm sessile polyps were seen in the rectal vault and removed with cold biopsy forcepsc submitted to pathology.   2 diminutive 1-2 mm sessile polyps were seen in the rectal vault and removed with cold biopsy forcepsc submitted to pathology.   The colon mucosa was otherwise normal.  Retroflexed views revealed no abnormalities. The time to cecum=3 minutes 52 seconds.  Withdrawal time=9 minutes 56 seconds.  The scope was withdrawn and the procedure completed. COMPLICATIONS: There were no complications.  ENDOSCOPIC IMPRESSION: 1.   Sessile polyp measuring 3 mm in size was found; polypectomy was performed with a cold snare 2.   2 diminutive 1-2  mm sessile polyps were seen in the rectal vault and removed with cold biopsy forcepsc submitted to pathology 3..   The colon mucosa was otherwise normal  RECOMMENDATIONS: If the polyp(s) removed today are proven to be adenomatous (pre-cancerous) polyps, you will need a repeat colonoscopy in 5 years.  Otherwise you should continue to follow colorectal cancer screening guidelines for "routine risk" patients with colonoscopy in 10 years.  You will receive a letter within 1-2 weeks with the results of your biopsy as well as final recommendations.  Please call my office if you have not received a letter after 3 weeks.   eSigned:  Louis Meckel, MD 02/12/2012 11:36 AM   cc:

## 2012-02-12 NOTE — Progress Notes (Signed)
Patient did not experience any of the following events: a burn prior to discharge; a fall within the facility; wrong site/side/patient/procedure/implant event; or a hospital transfer or hospital admission upon discharge from the facility. (G8907) Patient did not have preoperative order for IV antibiotic SSI prophylaxis. (G8918)  

## 2012-02-15 ENCOUNTER — Telehealth: Payer: Self-pay | Admitting: *Deleted

## 2012-02-15 NOTE — Telephone Encounter (Signed)
  Follow up Call-  Call back number 02/12/2012  Post procedure Call Back phone  # [phone]  Permission to leave phone message Yes     Patient questions:  Do you have a fever, pain , or abdominal swelling? no Pain Score  0 *  Have you tolerated food without any problems? yes  Have you been able to return to your normal activities? yes  Do you have any questions about your discharge instructions: Diet   no Medications  no Follow up visit  no  Do you have questions or concerns about your Care? no  Actions: * If pain score is 4 or above: No action needed, pain <4.

## 2012-02-17 ENCOUNTER — Encounter: Payer: Self-pay | Admitting: Gastroenterology

## 2012-03-24 ENCOUNTER — Encounter: Payer: Self-pay | Admitting: Internal Medicine

## 2012-04-05 ENCOUNTER — Other Ambulatory Visit: Payer: Self-pay | Admitting: Internal Medicine

## 2012-04-07 NOTE — Telephone Encounter (Signed)
ok 

## 2012-04-08 NOTE — Telephone Encounter (Signed)
Rx refill called in to pharmacy.

## 2012-06-07 ENCOUNTER — Encounter: Payer: Self-pay | Admitting: Internal Medicine

## 2012-06-07 ENCOUNTER — Ambulatory Visit (INDEPENDENT_AMBULATORY_CARE_PROVIDER_SITE_OTHER): Payer: 59 | Admitting: Internal Medicine

## 2012-06-07 VITALS — BP 140/96 | HR 71 | Temp 98.5°F | Resp 18 | Wt 211.0 lb

## 2012-06-07 DIAGNOSIS — I10 Essential (primary) hypertension: Secondary | ICD-10-CM

## 2012-06-07 DIAGNOSIS — J309 Allergic rhinitis, unspecified: Secondary | ICD-10-CM

## 2012-06-07 DIAGNOSIS — IMO0002 Reserved for concepts with insufficient information to code with codable children: Secondary | ICD-10-CM

## 2012-06-07 DIAGNOSIS — S76019A Strain of muscle, fascia and tendon of unspecified hip, initial encounter: Secondary | ICD-10-CM

## 2012-06-07 MED ORDER — ESTROGENS CONJUGATED 0.625 MG PO TABS: 0.6250 mg | ORAL_TABLET | Freq: Every day | ORAL | Status: DC

## 2012-06-07 MED ORDER — NAPROXEN 500 MG PO TABS: 500.0000 mg | ORAL_TABLET | Freq: Two times a day (BID) | ORAL | Status: DC

## 2012-06-07 MED ORDER — FEXOFENADINE HCL 180 MG PO TABS: 180.0000 mg | ORAL_TABLET | Freq: Every day | ORAL | Status: DC

## 2012-06-07 MED ORDER — POTASSIUM CHLORIDE CRYS ER 10 MEQ PO TBCR: 10.0000 meq | EXTENDED_RELEASE_TABLET | Freq: Two times a day (BID) | ORAL | Status: DC

## 2012-06-07 MED ORDER — TRIAMTERENE-HCTZ 37.5-25 MG PO CAPS: 1.0000 | ORAL_CAPSULE | Freq: Every day | ORAL | Status: DC

## 2012-06-07 NOTE — Patient Instructions (Signed)
Limit your sodium (Salt) intake  Please check your blood pressure on a regular basis.  If it is consistently greater than 150/90, please make an office appointment.    It is important that you exercise regularly, at least 20 minutes 3 to 4 times per week.  If you develop chest pain or shortness of breath seek  medical attention.  You need to lose weight.  Consider a lower calorie diet and regular exercise.DASH Diet The DASH diet stands for "Dietary Approaches to Stop Hypertension." It is a healthy eating plan that has been shown to reduce high blood pressure (hypertension) in as little as 14 days, while also possibly providing other significant health benefits. These other health benefits include reducing the risk of breast cancer after menopause and reducing the risk of type 2 diabetes, heart disease, colon cancer, and stroke. Health benefits also include weight loss and slowing kidney failure in patients with chronic kidney disease.   DIET GUIDELINES  Limit salt (sodium). Your diet should contain less than 1500 mg of sodium daily.   Limit refined or processed carbohydrates. Your diet should include mostly whole grains. Desserts and added sugars should be used sparingly.   Include small amounts of heart-healthy fats. These types of fats include nuts, oils, and tub margarine. Limit saturated and trans fats. These fats have been shown to be harmful in the body.  CHOOSING FOODS   The following food groups are based on a 2000 calorie diet. See your Registered Dietitian for individual calorie needs. Grains and Grain Products (6 to 8 servings daily)  Eat More Often: Whole-wheat bread, brown rice, whole-grain or wheat pasta, quinoa, popcorn without added fat or salt (air popped).   Eat Less Often: White bread, white pasta, white rice, cornbread.  Vegetables (4 to 5 servings daily)  Eat More Often: Fresh, frozen, and canned vegetables. Vegetables may be raw, steamed, roasted, or grilled with a  minimal amount of fat.   Eat Less Often/Avoid: Creamed or fried vegetables. Vegetables in a cheese sauce.  Fruit (4 to 5 servings daily)  Eat More Often: All fresh, canned (in natural juice), or frozen fruits. Dried fruits without added sugar. One hundred percent fruit juice ( cup [237 mL] daily).   Eat Less Often: Dried fruits with added sugar. Canned fruit in light or heavy syrup.  [employer], Fish, and Poultry (2 servings or less daily. One serving is 3 to 4 oz [85-114 g]).  Eat More Often: Ninety percent or leaner ground beef, tenderloin, sirloin. Round cuts of beef, chicken breast, [place] breast. All fish. Grill, bake, or broil your meat. Nothing should be fried.   Eat Less Often/Avoid: Fatty cuts of meat, [place], or chicken leg, thigh, or wing. Fried cuts of meat or fish.  Dairy (2 to 3 servings)  Eat More Often: Low-fat or fat-free milk, low-fat plain or light yogurt, reduced-fat or part-skim cheese.   Eat Less Often/Avoid: Milk (whole, 2%). Whole milk yogurt. Full-fat cheeses.  Nuts, Seeds, and Legumes (4 to 5 servings per week)  Eat More Often: All without added salt.   Eat Less Often/Avoid: Salted nuts and seeds, canned beans with added salt.  Fats and Sweets (limited)  Eat More Often: Vegetable oils, tub margarines without trans fats, sugar-free gelatin. Mayonnaise and salad dressings.   Eat Less Often/Avoid: Coconut oils, palm oils, butter, stick margarine, cream, half and half, cookies, candy, pie.  FOR MORE INFORMATION The Dash Diet Eating Plan: www.dashdiet.org Document Released: 04/23/2011 Document Revised: 07/27/2011 Document  Reviewed: 04/23/2011 [facility] Patient Information 2013 [place], [place].

## 2012-06-07 NOTE — Progress Notes (Signed)
Subjective:    Patient ID: Morgan Wolfe, female    DOB: October 27, 1955, 57 y.o.   MRN: [id number]  HPI  57 year old patient who has treated hypertension. She has a history of allergic rhinitis. She is doing quite well. Last week she had an episode of vertigo that lasted for a day presently she is doing well. No new concerns or complaints. She does have some work-related bilateral shoulder discomfort.  Past Medical History  Diagnosis Date  . ALLERGIC RHINITIS   . Hypertension   . Right shoulder pain     History   Social History  . Marital Status: Single    Spouse Name: N/A    Number of Children: N/A  . Years of Education: N/A   Occupational History  . Not on file.   Social History Main Topics  . Smoking status: Never Smoker   . Smokeless tobacco: Never Used  . Alcohol Use: No  . Drug Use: No  . Sexually Active: Not on file   Other Topics Concern  . Not on file   Social History Narrative  . No narrative on file    Past Surgical History  Procedure Date  . Ankle surgery 2000  . Toe surgery 1998  . Vaginal hysterectomy 1980    per Dr. Tenny Craw for fibroids    Family History  Problem Relation Age of Onset  . Coronary artery disease      1st degress relatives <50  . Hypertension    . Stroke Brother   . Hyperlipidemia    . Colon cancer Neg Hx   . Stomach cancer Neg Hx     Allergies  Allergen Reactions  . Sulfonamide Derivatives Itching    Current Outpatient Prescriptions on File Prior to Visit  Medication Sig Dispense Refill  . estrogens, conjugated, (PREMARIN) 0.625 MG tablet Take 1 tablet (0.625 mg total) by mouth daily. Take daily for 21 days then do not take for 7 days.  90 tablet  3  . fexofenadine (ALLEGRA) 180 MG tablet Take 1 tablet (180 mg total) by mouth daily.  90 tablet  6  . HYDROMET 5-1.5 MG/5ML syrup take 1/2 teaspoonful by mouth every 6 hours if needed  120 mL  0  . naproxen (NAPROSYN) 500 MG tablet Take 1 tablet (500 mg total) by mouth 2 (two)  times daily with a meal.  60 tablet  1  . potassium chloride (KLOR-CON) 10 MEQ CR tablet Take 1 tablet (10 mEq total) by mouth 2 (two) times daily.  180 tablet  6  . triamcinolone (NASACORT) 55 MCG/ACT nasal inhaler Place 2 sprays into the nose daily.  1 Inhaler  1  . triamterene-hydrochlorothiazide (DYAZIDE) 37.5-25 MG per capsule Take 1 each (1 capsule total) by mouth daily.  90 capsule  4    BP 140/96  Pulse 71  Temp 98.5 F (36.9 C) (Oral)  Resp 18  Wt 211 lb (95.709 kg)  SpO2 98%       Review of Systems  Constitutional: Negative.   HENT: Negative for hearing loss, congestion, sore throat, rhinorrhea, dental problem, sinus pressure and tinnitus.   Eyes: Negative for pain, discharge and visual disturbance.  Respiratory: Negative for cough and shortness of breath.   Cardiovascular: Negative for chest pain, palpitations and leg swelling.  Gastrointestinal: Negative for nausea, vomiting, abdominal pain, diarrhea, constipation, blood in stool and abdominal distention.  Genitourinary: Negative for dysuria, urgency, frequency, hematuria, flank pain, vaginal bleeding, vaginal discharge, difficulty urinating, vaginal pain and  pelvic pain.  Musculoskeletal: Positive for arthralgias. Negative for joint swelling and gait problem.  Skin: Negative for rash.  Neurological: Negative for dizziness, syncope, speech difficulty, weakness, numbness and headaches.  Hematological: Negative for adenopathy.  Psychiatric/Behavioral: Negative for behavioral problems, dysphoric mood and agitation. The patient is not nervous/anxious.        Objective:   Physical Exam  Constitutional: She is oriented to person, place, and time. She appears well-developed and well-nourished.       Blood pressure 150/90  HENT:  Head: Normocephalic.  Right Ear: External ear normal.  Left Ear: External ear normal.  Mouth/Throat: Oropharynx is clear and moist.  Eyes: Conjunctivae normal and EOM are normal. Pupils are  equal, round, and reactive to light.  Neck: Normal range of motion. Neck supple. No thyromegaly present.  Cardiovascular: Normal rate, regular rhythm, normal heart sounds and intact distal pulses.   Pulmonary/Chest: Effort normal and breath sounds normal.  Abdominal: Soft. Bowel sounds are normal. She exhibits no mass. There is no tenderness.  Musculoskeletal: Normal range of motion.  Lymphadenopathy:    She has no cervical adenopathy.  Neurological: She is alert and oriented to person, place, and time.  Skin: Skin is warm and dry. No rash noted.  Psychiatric: She has a normal mood and affect. Her behavior is normal.          Assessment & Plan:   Hypertension. Unclear control. Home blood pressure monitoring encouraged. Low-salt DASH diet recommended weight loss and exercise regimen encouraged Allergic rhinitis  All medications refilled

## 2012-06-07 NOTE — Progress Notes (Signed)
  Subjective:    Patient ID: Morgan Wolfe, female    DOB: [birth year], 57 y.o.   MRN: [id number]  HPI  BP Readings from Last 3 Encounters:  06/07/12 140/96  02/12/12 130/77  12/18/11 120/82    Review of Systems See above    Objective:   Physical Exam  See above      Assessment & Plan:   See above

## 2012-06-08 ENCOUNTER — Ambulatory Visit: Payer: 59 | Admitting: Internal Medicine

## 2012-06-20 ENCOUNTER — Ambulatory Visit: Payer: 59 | Admitting: Internal Medicine

## 2012-06-27 ENCOUNTER — Encounter: Payer: Self-pay | Admitting: Internal Medicine

## 2012-07-11 ENCOUNTER — Ambulatory Visit (INDEPENDENT_AMBULATORY_CARE_PROVIDER_SITE_OTHER): Payer: 59 | Admitting: Family Medicine

## 2012-07-11 ENCOUNTER — Encounter: Payer: Self-pay | Admitting: Family Medicine

## 2012-07-11 VITALS — BP 138/82 | HR 101 | Temp 98.2°F | Wt 204.0 lb

## 2012-07-11 DIAGNOSIS — R509 Fever, unspecified: Secondary | ICD-10-CM

## 2012-07-11 LAB — HEPATIC FUNCTION PANEL
ALT: 20 U/L (ref 0–35)
AST: 23 U/L (ref 0–37)
Alkaline Phosphatase: 66 U/L (ref 39–117)
Bilirubin, Direct: 0.1 mg/dL (ref 0.0–0.3)
Total Bilirubin: 0.5 mg/dL (ref 0.3–1.2)

## 2012-07-11 LAB — CBC WITH DIFFERENTIAL/PLATELET
Basophils Absolute: 0 10*3/uL (ref 0.0–0.1)
Eosinophils Relative: 1 % (ref 0.0–5.0)
HCT: 35.4 % — ABNORMAL LOW (ref 36.0–46.0)
Lymphs Abs: 2.4 10*3/uL (ref 0.7–4.0)
MCV: 83.2 fl (ref 78.0–100.0)
Monocytes Absolute: 0.7 10*3/uL (ref 0.1–1.0)
Neutro Abs: 1.7 10*3/uL (ref 1.4–7.7)
Platelets: 295 10*3/uL (ref 150.0–400.0)
RDW: 13.5 % (ref 11.5–14.6)

## 2012-07-11 LAB — POCT URINALYSIS DIPSTICK
Ketones, UA: NEGATIVE
Protein, UA: NEGATIVE
Spec Grav, UA: 1.02
pH, UA: 6

## 2012-07-11 LAB — BASIC METABOLIC PANEL
Chloride: 103 mEq/L (ref 96–112)
Potassium: 3.5 mEq/L (ref 3.5–5.1)
Sodium: 142 mEq/L (ref 135–145)

## 2012-07-11 NOTE — Progress Notes (Signed)
  Subjective:    Patient ID: Morgan Wolfe, female    DOB: 1955/08/05, 57 y.o.   MRN: [id number]  HPI Here for 3 days of fevers to 100.5 degrees and mild body aches. No other symptoms at all. Taking Advil and drinking fluids.    Review of Systems  Constitutional: Positive for fever. Negative for chills and diaphoresis.  HENT: Negative.   Eyes: Negative.   Respiratory: Negative.   Cardiovascular: Negative.   Gastrointestinal: Negative.   Genitourinary: Negative.   Neurological: Negative.   Hematological: Negative.        Objective:   Physical Exam  Constitutional: She appears well-developed and well-nourished. No distress.  HENT:  Right Ear: External ear normal.  Left Ear: External ear normal.  Nose: Nose normal.  Mouth/Throat: Oropharynx is clear and moist. No oropharyngeal exudate.  Eyes: Conjunctivae are normal. No scleral icterus.  Neck: No thyromegaly present.  Cardiovascular: Normal rate, regular rhythm, normal heart sounds and intact distal pulses.   Pulmonary/Chest: Effort normal and breath sounds normal.  Abdominal: Soft. Bowel sounds are normal. She exhibits no distension and no mass. There is no tenderness. There is no rebound and no guarding.  Lymphadenopathy:    She has no cervical adenopathy.          Assessment & Plan:  Fever of uncertain etiology, possibly viral. Get labs. She will follow up if anything changes

## 2012-07-12 NOTE — Progress Notes (Signed)
Quick Note:  I left voice message with normal results. ______ 

## 2012-11-14 ENCOUNTER — Other Ambulatory Visit (INDEPENDENT_AMBULATORY_CARE_PROVIDER_SITE_OTHER): Payer: 59

## 2012-11-14 DIAGNOSIS — Z0000 Encounter for general adult medical examination without abnormal findings: Secondary | ICD-10-CM

## 2012-11-14 LAB — LIPID PANEL
Cholesterol: 176 mg/dL (ref 0–200)
HDL: 59.3 mg/dL (ref >39.00)
Triglycerides: 250 mg/dL — ABNORMAL HIGH (ref 0.0–149.0)
VLDL: 50 mg/dL — ABNORMAL HIGH (ref 0.0–40.0)

## 2012-11-14 LAB — CBC WITH DIFFERENTIAL/PLATELET
Basophils Absolute: 0.1 10*3/uL (ref 0.0–0.1)
Eosinophils Absolute: 0.2 10*3/uL (ref 0.0–0.7)
Hemoglobin: 12.2 g/dL (ref 12.0–15.0)
Lymphocytes Relative: 56.7 % — ABNORMAL HIGH (ref 12.0–46.0)
Monocytes Relative: 7 % (ref 3.0–12.0)
Neutro Abs: 2.5 10*3/uL (ref 1.4–7.7)
Neutrophils Relative %: 33.3 % — ABNORMAL LOW (ref 43.0–77.0)
Platelets: 314 10*3/uL (ref 150.0–400.0)
RDW: 13.6 % (ref 11.5–14.6)

## 2012-11-14 LAB — HEPATIC FUNCTION PANEL
AST: 17 U/L (ref 0–37)
Alkaline Phosphatase: 65 U/L (ref 39–117)
Bilirubin, Direct: 0 mg/dL (ref 0.0–0.3)
Total Bilirubin: 0.4 mg/dL (ref 0.3–1.2)

## 2012-11-14 LAB — BASIC METABOLIC PANEL
Calcium: 9.3 mg/dL (ref 8.4–10.5)
Creatinine, Ser: 0.8 mg/dL (ref 0.4–1.2)
GFR: 94.46 mL/min (ref >60.00)
Glucose, Bld: 103 mg/dL — ABNORMAL HIGH (ref 70–99)
Sodium: 141 mEq/L (ref 135–145)

## 2012-11-14 LAB — POCT URINALYSIS DIPSTICK
Bilirubin, UA: NEGATIVE
Glucose, UA: NEGATIVE
Ketones, UA: NEGATIVE
Leukocytes, UA: NEGATIVE
Nitrite, UA: NEGATIVE
pH, UA: 7

## 2012-11-21 ENCOUNTER — Ambulatory Visit (INDEPENDENT_AMBULATORY_CARE_PROVIDER_SITE_OTHER): Payer: 59 | Admitting: Internal Medicine

## 2012-11-21 ENCOUNTER — Encounter: Payer: Self-pay | Admitting: Internal Medicine

## 2012-11-21 VITALS — BP 130/90 | HR 83 | Temp 98.5°F | Resp 20 | Ht 66.25 in | Wt 207.0 lb

## 2012-11-21 DIAGNOSIS — Z0000 Encounter for general adult medical examination without abnormal findings: Secondary | ICD-10-CM

## 2012-11-21 DIAGNOSIS — I10 Essential (primary) hypertension: Secondary | ICD-10-CM

## 2012-11-21 MED ORDER — TRIAMCINOLONE ACETONIDE(NASAL) 55 MCG/ACT NA INHA: 2.0000 | Freq: Every day | NASAL | Status: DC

## 2012-11-21 NOTE — Patient Instructions (Signed)

## 2012-11-21 NOTE — Progress Notes (Signed)
Subjective:    Patient ID: Morgan Wolfe, female    DOB: Oct 08, 1955, 57 y.o.   MRN: [id number]  HPI  BP Readings from Last 3 Encounters:  11/21/12 130/90  07/11/12 138/82  06/07/12 140/96   Wt Readings from Last 3 Encounters:  11/21/12 207 lb (93.895 kg)  07/11/12 204 lb (92.534 kg)  06/07/12 211 lb (95.26 kg)   57 year old patient who is seen today for a wellness exam. Medical problems include treated hypertension. This has been treated with diuretic therapy. On complaint today is right knee pain. This has been present for about four-week period she also has some rare episodes of vertigo. Did have a screening colonoscopy in 2013.  Past Medical History  Diagnosis Date  . ALLERGIC RHINITIS   . Hypertension   . Right shoulder pain     History   Social History  . Marital Status: Single    Spouse Name: N/A    Number of Children: N/A  . Years of Education: N/A   Occupational History  . Not on file.   Social History Main Topics  . Smoking status: Never Smoker   . Smokeless tobacco: Never Used  . Alcohol Use: No  . Drug Use: No  . Sexually Active: Not on file   Other Topics Concern  . Not on file   Social History Narrative  . No narrative on file    Past Surgical History  Procedure Laterality Date  . Ankle surgery  2000  . Toe surgery  1998  . Vaginal hysterectomy  1980    per Dr. Tenny Craw for fibroids    Family History  Problem Relation Age of Onset  . Coronary artery disease      1st degress relatives <50  . Hypertension    . Stroke Brother   . Hyperlipidemia    . Colon cancer Neg Hx   . Stomach cancer Neg Hx     Allergies  Allergen Reactions  . Sulfonamide Derivatives Itching    Current Outpatient Prescriptions on File Prior to Visit  Medication Sig Dispense Refill  . estrogens, conjugated, (PREMARIN) 0.625 MG tablet Take 1 tablet (0.625 mg total) by mouth daily. Take daily for 21 days then do not take for 7 days.  90 tablet  3  . fexofenadine  (ALLEGRA) 180 MG tablet Take 1 tablet (180 mg total) by mouth daily.  90 tablet  6  . naproxen (NAPROSYN) 500 MG tablet Take 1 tablet (500 mg total) by mouth 2 (two) times daily with a meal.  60 tablet  1  . potassium chloride (K-DUR,KLOR-CON) 10 MEQ tablet Take 1 tablet (10 mEq total) by mouth 2 (two) times daily.  180 tablet  6  . triamterene-hydrochlorothiazide (DYAZIDE) 37.5-25 MG per capsule Take 1 each (1 capsule total) by mouth daily.  90 capsule  4  . HYDROMET 5-1.5 MG/5ML syrup take 1/2 teaspoonful by mouth every 6 hours if needed  120 mL  0   No current facility-administered medications on file prior to visit.    BP 130/90  Pulse 83  Temp(Src) 98.5 F (36.9 C) (Oral)  Resp 20  Ht 5' 6.25" (1.683 m)  Wt 207 lb (93.895 kg)  BMI 33.15 kg/m2  SpO2 99%     Review of Systems  Constitutional: Negative for fever, appetite change, fatigue and unexpected weight change.  HENT: Negative for hearing loss, ear pain, nosebleeds, congestion, sore throat, mouth sores, trouble swallowing, neck stiffness, dental problem, voice change, sinus pressure and  tinnitus.   Eyes: Negative for photophobia, pain, redness and visual disturbance.  Respiratory: Negative for cough, chest tightness and shortness of breath.   Cardiovascular: Negative for chest pain, palpitations and leg swelling.  Gastrointestinal: Negative for nausea, vomiting, abdominal pain, diarrhea, constipation, blood in stool, abdominal distention and rectal pain.  Genitourinary: Negative for dysuria, urgency, frequency, hematuria, flank pain, vaginal bleeding, vaginal discharge, difficulty urinating, genital sores, vaginal pain, menstrual problem and pelvic pain.  Musculoskeletal: Positive for arthralgias. Negative for back pain.       Right knee pain and stiffness  Skin: Negative for rash.  Neurological: Positive for dizziness. Negative for syncope, speech difficulty, weakness, light-headedness, numbness and headaches.   Hematological: Negative for adenopathy. Does not bruise/bleed easily.  Psychiatric/Behavioral: Negative for suicidal ideas, behavioral problems, self-injury, dysphoric mood and agitation. The patient is not nervous/anxious.        Objective:   Physical Exam  Constitutional: She is oriented to person, place, and time. She appears well-developed and well-nourished.  HENT:  Head: Normocephalic and atraumatic.  Right Ear: External ear normal.  Left Ear: External ear normal.  Mouth/Throat: Oropharynx is clear and moist.  Eyes: Conjunctivae and EOM are normal.  Neck: Normal range of motion. Neck supple. No JVD present. No thyromegaly present.  Cardiovascular: Normal rate, regular rhythm, normal heart sounds and intact distal pulses.   No murmur heard. Pulmonary/Chest: Effort normal and breath sounds normal. She has no wheezes. She has no rales.  Abdominal: Soft. Bowel sounds are normal. She exhibits no distension and no mass. There is no tenderness. There is no rebound and no guarding.  Musculoskeletal: Normal range of motion. She exhibits no edema and no tenderness.  Neurological: She is alert and oriented to person, place, and time. She has normal reflexes. No cranial nerve deficit. She exhibits normal muscle tone. Coordination normal.  Skin: Skin is warm and dry. No rash noted.  Psychiatric: She has a normal mood and affect. Her behavior is normal.          Assessment & Plan:  Preventive health exam Right knee pain. Will try short-term Aleve and observe. Will call if this fails to improve Hypertension well controlled. Repeat blood pressure 120/76 Mild obesity. Weight loss encouraged  Home blood pressure monitoring encouraged Recheck 6 months

## 2012-12-05 ENCOUNTER — Encounter: Payer: 59 | Admitting: Internal Medicine

## 2013-02-16 ENCOUNTER — Telehealth: Payer: Self-pay | Admitting: Internal Medicine

## 2013-02-16 MED ORDER — HYDROCODONE-HOMATROPINE 5-1.5 MG/5ML PO SYRP: ORAL_SOLUTION | ORAL | Status: DC

## 2013-02-16 NOTE — Telephone Encounter (Signed)
Patient Information:  Caller Name: Blaike  Phone: [phone]  Patient: Morgan Wolfe, Morgan Wolfe  Gender: Female  DOB: 1955-07-24  Age: 57 Years  PCP: Eleonore Chiquito (Family Practice > 79yrs old)  Pregnant: No  Office Follow Up:  Does the office need to follow up with this patient?: Yes  Instructions For The Office: Requests Rx for cough syrup; history of Hydromet.  Please follow up.  Thank you.  RN Note:  [facility] on [address] is requested pharmacy.  Symptoms  Reason For Call & Symptoms: Patient calling.  Relates allergies are flared up causing her to have a cough (later denied allergy symptoms); she requests cough syrup refill.  She states coughing causes her to have sore throat and body aches.  Reviewed EMR; history of having Hydromet from 04/05/12.  Emergent symptoms ruled out.  Home care per Cough protocol due to Cough with no complications.  Caller insists that request be sent to provider for cough syrup Rx.  Reviewed Health History In EMR: Yes  Reviewed Medications In EMR: Yes  Reviewed Allergies In EMR: Yes  Reviewed Surgeries / Procedures: Yes  Date of Onset of Symptoms: 02/15/2013  Treatments Tried: OTC meds - unsure which; cough drops, honey, hard candies.  Treatments Tried Worked: No OB / GYN:  LMP: Unknown  Guideline(s) Used:  Cough  Disposition Per Guideline:   Home Care  Reason For Disposition Reached:   Cough with no complications  Advice Given:  Coughing Spasms:  Drink warm fluids. Inhale warm mist (Reason: both relax the airway and loosen up the phlegm).  Suck on cough drops or hard candy to coat the irritated throat.  Prevent Dehydration:  Drink adequate liquids.  This will help soothe an irritated or dry throat and loosen up the phlegm.  Call Back If:  Difficulty breathing  Cough lasts more than 3 weeks  Fever lasts > 3 days  You become worse.  Patient Refused Recommendation:  Patient Requests Prescription  Requests cough syrup.

## 2013-02-16 NOTE — Telephone Encounter (Signed)
Please advise if okay to refill Hycodan cough syrup? 

## 2013-02-16 NOTE — Telephone Encounter (Signed)
Left message Rx called into pharmacy as requested.

## 2013-02-16 NOTE — Telephone Encounter (Signed)
Ok 6 oz 

## 2013-02-20 ENCOUNTER — Other Ambulatory Visit: Payer: Self-pay | Admitting: Internal Medicine

## 2013-05-19 ENCOUNTER — Encounter: Payer: Self-pay | Admitting: Internal Medicine

## 2013-05-19 ENCOUNTER — Ambulatory Visit (INDEPENDENT_AMBULATORY_CARE_PROVIDER_SITE_OTHER): Payer: 59 | Admitting: Internal Medicine

## 2013-05-19 VITALS — BP 120/88 | HR 74 | Temp 98.1°F | Resp 20 | Ht 66.25 in | Wt 206.0 lb

## 2013-05-19 DIAGNOSIS — I10 Essential (primary) hypertension: Secondary | ICD-10-CM

## 2013-05-19 MED ORDER — FEXOFENADINE HCL 180 MG PO TABS: 180.0000 mg | ORAL_TABLET | Freq: Every day | ORAL | Status: DC

## 2013-05-19 MED ORDER — MECLIZINE HCL 25 MG PO TABS: 25.0000 mg | ORAL_TABLET | Freq: Three times a day (TID) | ORAL | Status: DC | PRN

## 2013-05-19 MED ORDER — TRIAMTERENE-HCTZ 37.5-25 MG PO CAPS: 1.0000 | ORAL_CAPSULE | Freq: Every day | ORAL | Status: DC

## 2013-05-19 MED ORDER — TRIAMCINOLONE ACETONIDE 55 MCG/ACT NA AERO: 2.0000 | INHALATION_SPRAY | Freq: Every day | NASAL | Status: DC

## 2013-05-19 MED ORDER — POTASSIUM CHLORIDE CRYS ER 10 MEQ PO TBCR: 10.0000 meq | EXTENDED_RELEASE_TABLET | Freq: Two times a day (BID) | ORAL | Status: DC

## 2013-05-19 MED ORDER — ESTROGENS CONJUGATED 0.625 MG PO TABS
ORAL_TABLET | ORAL | Status: DC
Start: 2013-05-19 — End: 2014-06-29

## 2013-05-19 NOTE — Progress Notes (Signed)
Pre-visit discussion using our clinic review tool. No additional management support is needed unless otherwise documented below in the visit note.  

## 2013-05-19 NOTE — Progress Notes (Signed)
Subjective:    Patient ID: Morgan Wolfe, female    DOB: 1955-06-01, 58 y.o.   MRN: [id number]  HPI Pre-visit discussion using our clinic review tool. No additional management support is needed unless otherwise documented below in the visit note.  58 year old patient who is seen today for her biannual followup. She is doing quite well with treated hypertension. Her only complaint is right knee and right ankle pain. She also is requesting some medicine for some occasional episodic vertigo. Has been compliant with her medications Blood pressure well controlled today Past Medical History  Diagnosis Date  . ALLERGIC RHINITIS   . Hypertension   . Right shoulder pain     History   Social History  . Marital Status: Single    Spouse Name: N/A    Number of Children: N/A  . Years of Education: N/A   Occupational History  . Not on file.   Social History Main Topics  . Smoking status: Never Smoker   . Smokeless tobacco: Never Used  . Alcohol Use: No  . Drug Use: No  . Sexual Activity: Not on file   Other Topics Concern  . Not on file   Social History Narrative  . No narrative on file    Past Surgical History  Procedure Laterality Date  . Ankle surgery  2000  . Toe surgery  1998  . Vaginal hysterectomy  1980    per Dr. Harrington Challenger for fibroids    Family History  Problem Relation Age of Onset  . Coronary artery disease      1st degress relatives <50  . Hypertension    . Stroke Brother   . Hyperlipidemia    . Colon cancer Neg Hx   . Stomach cancer Neg Hx     Allergies  Allergen Reactions  . Sulfonamide Derivatives Itching    Current Outpatient Prescriptions on File Prior to Visit  Medication Sig Dispense Refill  . HYDROcodone-homatropine (HYDROMET) 5-1.5 MG/5ML syrup take 1/2 teaspoonful by mouth every 6 hours if needed  120 mL  0  . naproxen (NAPROSYN) 500 MG tablet Take 1 tablet (500 mg total) by mouth 2 (two) times daily with a meal.  60 tablet  1   No current  facility-administered medications on file prior to visit.    BP 120/88  Pulse 74  Temp(Src) 98.1 F (36.7 C) (Oral)  Resp 20  Ht 5' 6.25" (1.683 m)  Wt 206 lb (93.441 kg)  BMI 32.99 kg/m2  SpO2 98%        Review of Systems  Constitutional: Negative.   HENT: Negative for congestion, dental problem, hearing loss, rhinorrhea, sinus pressure, sore throat and tinnitus.   Eyes: Negative for pain, discharge and visual disturbance.  Respiratory: Negative for cough and shortness of breath.   Cardiovascular: Negative for chest pain, palpitations and leg swelling.  Gastrointestinal: Negative for nausea, vomiting, abdominal pain, diarrhea, constipation, blood in stool and abdominal distention.  Genitourinary: Negative for dysuria, urgency, frequency, hematuria, flank pain, vaginal bleeding, vaginal discharge, difficulty urinating, vaginal pain and pelvic pain.  Musculoskeletal: Positive for arthralgias. Negative for gait problem and joint swelling.  Skin: Negative for rash.  Neurological: Negative for dizziness, syncope, speech difficulty, weakness, numbness and headaches.  Hematological: Negative for adenopathy.  Psychiatric/Behavioral: Negative for behavioral problems, dysphoric mood and agitation. The patient is not nervous/anxious.        Objective:   Physical Exam  Constitutional: She is oriented to person, place, and time. She  appears well-developed and well-nourished.  HENT:  Head: Normocephalic.  Right Ear: External ear normal.  Left Ear: External ear normal.  Mouth/Throat: Oropharynx is clear and moist.  Eyes: Conjunctivae and EOM are normal. Pupils are equal, round, and reactive to light.  Neck: Normal range of motion. Neck supple. No thyromegaly present.  Cardiovascular: Normal rate, regular rhythm, normal heart sounds and intact distal pulses.   Pulmonary/Chest: Effort normal and breath sounds normal.  Abdominal: Soft. Bowel sounds are normal. She exhibits no mass.  There is no tenderness.  Musculoskeletal: Normal range of motion.  Lymphadenopathy:    She has no cervical adenopathy.  Neurological: She is alert and oriented to person, place, and time.  Skin: Skin is warm and dry. No rash noted.  Psychiatric: She has a normal mood and affect. Her behavior is normal.          Assessment & Plan:  Hypertension. Well controlled. Repeat blood pressure 120/80 Right knee pain. This appears be fairly minor and nonprogressive. We'll continue to observe and use when necessary anti-inflammatory medications  Medicines updated Recheck 6 months

## 2013-05-19 NOTE — Patient Instructions (Signed)

## 2013-07-03 ENCOUNTER — Ambulatory Visit (INDEPENDENT_AMBULATORY_CARE_PROVIDER_SITE_OTHER): Payer: 59 | Admitting: Internal Medicine

## 2013-07-03 ENCOUNTER — Encounter: Payer: Self-pay | Admitting: Internal Medicine

## 2013-07-03 VITALS — BP 136/80 | HR 106 | Temp 98.7°F | Resp 20 | Ht 66.25 in | Wt 205.0 lb

## 2013-07-03 DIAGNOSIS — I10 Essential (primary) hypertension: Secondary | ICD-10-CM

## 2013-07-03 DIAGNOSIS — J069 Acute upper respiratory infection, unspecified: Secondary | ICD-10-CM

## 2013-07-03 MED ORDER — HYDROCODONE-HOMATROPINE 5-1.5 MG/5ML PO SYRP: ORAL_SOLUTION | ORAL | Status: DC

## 2013-07-03 NOTE — Patient Instructions (Signed)
Acute bronchitis symptoms for less than 10 days are generally not helped by antibiotics.  Take over-the-counter expectorants and cough medications such as  Mucinex DM.  Call if there is no improvement in 5 to 7 days or if he developed worsening cough, fever, or new symptoms, such as shortness of breath or chest pain.    

## 2013-07-03 NOTE — Progress Notes (Signed)
Pre-visit discussion using our clinic review tool. No additional management support is needed unless otherwise documented below in the visit note.  

## 2013-07-03 NOTE — Progress Notes (Signed)
Subjective:    Patient ID: Morgan Wolfe, female    DOB: 17-Jan-1956, 58 y.o.   MRN: [id number]  HPI 58 year old patient with a history of treated hypertension.  She presents with a five-day history of nasal and chest congestion, cough, and general sense of wellness.  Denies any fever, chills, chest pain or shortness of breath.  She has failed symptomatic treatment.   Past Medical History  Diagnosis Date  . ALLERGIC RHINITIS   . Hypertension   . Right shoulder pain     History   Social History  . Marital Status: Single    Spouse Name: N/A    Number of Children: N/A  . Years of Education: N/A   Occupational History  . Not on file.   Social History Main Topics  . Smoking status: Never Smoker   . Smokeless tobacco: Never Used  . Alcohol Use: No  . Drug Use: No  . Sexual Activity: Not on file   Other Topics Concern  . Not on file   Social History Narrative  . No narrative on file    Past Surgical History  Procedure Laterality Date  . Ankle surgery  2000  . Toe surgery  1998  . Vaginal hysterectomy  1980    per Dr. Harrington Challenger for fibroids    Family History  Problem Relation Age of Onset  . Coronary artery disease      1st degress relatives <50  . Hypertension    . Stroke Brother   . Hyperlipidemia    . Colon cancer Neg Hx   . Stomach cancer Neg Hx     Allergies  Allergen Reactions  . Sulfonamide Derivatives Itching    Current Outpatient Prescriptions on File Prior to Visit  Medication Sig Dispense Refill  . estrogens, conjugated, (PREMARIN) 0.625 MG tablet TAKE 1 TABLET DAILY FOR 21 DAYS THEN DO NOT TAKE FOR 7 DAYS  90 tablet  3  . fexofenadine (ALLEGRA) 180 MG tablet Take 1 tablet (180 mg total) by mouth daily.  90 tablet  3  . HYDROcodone-homatropine (HYDROMET) 5-1.5 MG/5ML syrup take 1/2 teaspoonful by mouth every 6 hours if needed  120 mL  0  . meclizine (ANTIVERT) 25 MG tablet Take 1 tablet (25 mg total) by mouth 3 (three) times daily as needed for  dizziness.  30 tablet  4  . naproxen (NAPROSYN) 500 MG tablet Take 1 tablet (500 mg total) by mouth 2 (two) times daily with a meal.  60 tablet  1  . potassium chloride (K-DUR,KLOR-CON) 10 MEQ tablet Take 1 tablet (10 mEq total) by mouth 2 (two) times daily.  180 tablet  3  . triamcinolone (NASACORT) 55 MCG/ACT AERO nasal inhaler Place 2 sprays into the nose daily.  3 Inhaler  3  . triamterene-hydrochlorothiazide (DYAZIDE) 37.5-25 MG per capsule Take 1 each (1 capsule total) by mouth daily.  90 capsule  3   No current facility-administered medications on file prior to visit.    BP 136/80  Pulse 106  Temp(Src) 98.7 F (37.1 C) (Oral)  Resp 20  Ht 5' 6.25" (1.683 m)  Wt 205 lb (92.987 kg)  BMI 32.83 kg/m2  SpO2 98%   Review of Systems  Constitutional: Positive for activity change, appetite change and fatigue.  HENT: Positive for postnasal drip and sinus pressure. Negative for congestion, dental problem, hearing loss, rhinorrhea, sore throat and tinnitus.   Eyes: Negative for pain, discharge and visual disturbance.  Respiratory: Positive for cough. Negative  for shortness of breath.   Cardiovascular: Negative for chest pain, palpitations and leg swelling.  Gastrointestinal: Negative for nausea, vomiting, abdominal pain, diarrhea, constipation, blood in stool and abdominal distention.  Genitourinary: Negative for dysuria, urgency, frequency, hematuria, flank pain, vaginal bleeding, vaginal discharge, difficulty urinating, vaginal pain and pelvic pain.  Musculoskeletal: Negative for arthralgias, gait problem and joint swelling.  Skin: Negative for rash.  Neurological: Negative for dizziness, syncope, speech difficulty, weakness, numbness and headaches.  Hematological: Negative for adenopathy.  Psychiatric/Behavioral: Negative for behavioral problems, dysphoric mood and agitation. The patient is not nervous/anxious.        Objective:   Physical Exam  Constitutional: She is oriented to  person, place, and time. She appears well-developed and well-nourished. No distress.  124/76  HENT:  Head: Normocephalic.  Right Ear: External ear normal.  Left Ear: External ear normal.  Mouth/Throat: Oropharynx is clear and moist.  Eyes: Conjunctivae and EOM are normal. Pupils are equal, round, and reactive to light.  Neck: Normal range of motion. Neck supple. No thyromegaly present.  Cardiovascular: Normal rate, regular rhythm, normal heart sounds and intact distal pulses.   Pulmonary/Chest: Effort normal and breath sounds normal. No respiratory distress. She has no wheezes. She has no rales.  Abdominal: Soft. Bowel sounds are normal. She exhibits no mass. There is no tenderness.  Musculoskeletal: Normal range of motion.  Lymphadenopathy:    She has no cervical adenopathy.  Neurological: She is alert and oriented to person, place, and time.  Skin: Skin is warm and dry. No rash noted.  Psychiatric: She has a normal mood and affect. Her behavior is normal.          Assessment & Plan:   Viral URI with cough. Symptomatice Rx HTN- controlled

## 2013-07-05 ENCOUNTER — Telehealth: Payer: Self-pay | Admitting: Internal Medicine

## 2013-07-05 NOTE — Telephone Encounter (Signed)
Relevant patient education mailed to patient.  

## 2013-07-19 ENCOUNTER — Ambulatory Visit (INDEPENDENT_AMBULATORY_CARE_PROVIDER_SITE_OTHER): Payer: 59 | Admitting: Family

## 2013-07-19 ENCOUNTER — Encounter: Payer: Self-pay | Admitting: Family

## 2013-07-19 VITALS — BP 118/70 | HR 112 | Temp 102.4°F | Wt 203.0 lb

## 2013-07-19 DIAGNOSIS — J069 Acute upper respiratory infection, unspecified: Secondary | ICD-10-CM

## 2013-07-19 DIAGNOSIS — IMO0001 Reserved for inherently not codable concepts without codable children: Secondary | ICD-10-CM

## 2013-07-19 DIAGNOSIS — J111 Influenza due to unidentified influenza virus with other respiratory manifestations: Secondary | ICD-10-CM

## 2013-07-19 MED ORDER — GUAIFENESIN-CODEINE 100-10 MG/5ML PO SYRP: 5.0000 mL | ORAL_SOLUTION | Freq: Three times a day (TID) | ORAL | Status: DC | PRN

## 2013-07-19 MED ORDER — AMOXICILLIN 500 MG PO CAPS: 1000.0000 mg | ORAL_CAPSULE | Freq: Two times a day (BID) | ORAL | Status: DC

## 2013-07-19 NOTE — Patient Instructions (Signed)
Influenza, Adult °Influenza (flu) is an infection in the mouth, nose, and throat (respiratory tract) caused by a virus. The flu can make you feel very ill. Influenza spreads easily from person to person (contagious).  °HOME CARE  °· Only take medicines as told by your doctor. °· Use a cool mist humidifier to make breathing easier. °· Get plenty of rest until your fever goes away. This usually takes 3 to 4 days. °· Drink enough fluids to keep your pee (urine) clear or pale yellow. °· Cover your mouth and nose when you cough or sneeze. °· Wash your hands well to avoid spreading the flu. °· Stay home from work or school until your fever has been gone for at least 1 full day. °· Get a flu shot every year. °GET HELP RIGHT AWAY IF:  °· You have trouble breathing or feel short of breath. °· Your skin or nails turn blue. °· You have severe neck pain or stiffness. °· You have a severe headache, facial pain, or earache. °· Your fever gets worse or keeps coming back. °· You feel sick to your stomach (nauseous), throw up (vomit), or have watery poop (diarrhea). °· You have chest pain. °· You have a deep cough that gets worse, or you cough up more thick spit (mucus). °MAKE SURE YOU:  °· Understand these instructions. °· Will watch your condition. °· Will get help right away if you are not doing well or get worse. °Document Released: 02/11/2008 Document Revised: 11/03/2011 Document Reviewed: 08/03/2011 °[employer]® Patient Information ©2014 [employer]. ° °

## 2013-07-19 NOTE — Progress Notes (Signed)
Subjective:    Patient ID: Morgan Wolfe, female    DOB: 1955-06-23, 58 y.o.   MRN: [id number]  HPI 58 y.o. Black female presents today with chief complaint of "my whole body hurts, and I have a bad cough". Pt was seen two weeks ago for coughing and sinus pain and started on mucinex DM. Pt states she has had "fever body aches and coughing" since Sunday. She describes her cough as non productive and deep in her chest. She describes generalized body aches and chest pain with shortness of breath. Denies nasal discharge, denies headaches.     Review of Systems  Constitutional: Positive for fever, chills, activity change and fatigue.  HENT: Positive for congestion.   Respiratory: Positive for cough, chest tightness and shortness of breath.   Cardiovascular: Positive for chest pain.  Gastrointestinal: Negative.   Endocrine: Negative.   Genitourinary: Negative.   Musculoskeletal: Positive for myalgias.  Skin: Negative.   Allergic/Immunologic: Negative.   Neurological: Positive for weakness.  Hematological: Negative.   Psychiatric/Behavioral: Negative.    Past Medical History  Diagnosis Date  . ALLERGIC RHINITIS   . Hypertension   . Right shoulder pain     History   Social History  . Marital Status: Single    Spouse Name: N/A    Number of Children: N/A  . Years of Education: N/A   Occupational History  . Not on file.   Social History Main Topics  . Smoking status: Never Smoker   . Smokeless tobacco: Never Used  . Alcohol Use: No  . Drug Use: No  . Sexual Activity: Not on file   Other Topics Concern  . Not on file   Social History Narrative  . No narrative on file    Past Surgical History  Procedure Laterality Date  . Ankle surgery  2000  . Toe surgery  1998  . Vaginal hysterectomy  1980    per Dr. Harrington Challenger for fibroids    Family History  Problem Relation Age of Onset  . Coronary artery disease      1st degress relatives <50  . Hypertension    . Stroke  Brother   . Hyperlipidemia    . Colon cancer Neg Hx   . Stomach cancer Neg Hx     Allergies  Allergen Reactions  . Sulfonamide Derivatives Itching    Current Outpatient Prescriptions on File Prior to Visit  Medication Sig Dispense Refill  . estrogens, conjugated, (PREMARIN) 0.625 MG tablet TAKE 1 TABLET DAILY FOR 21 DAYS THEN DO NOT TAKE FOR 7 DAYS  90 tablet  3  . fexofenadine (ALLEGRA) 180 MG tablet Take 1 tablet (180 mg total) by mouth daily.  90 tablet  3  . HYDROcodone-homatropine (HYDROMET) 5-1.5 MG/5ML syrup take 1/2 teaspoonful by mouth every 6 hours if needed  120 mL  0  . meclizine (ANTIVERT) 25 MG tablet Take 1 tablet (25 mg total) by mouth 3 (three) times daily as needed for dizziness.  30 tablet  4  . naproxen (NAPROSYN) 500 MG tablet Take 1 tablet (500 mg total) by mouth 2 (two) times daily with a meal.  60 tablet  1  . potassium chloride (K-DUR,KLOR-CON) 10 MEQ tablet Take 1 tablet (10 mEq total) by mouth 2 (two) times daily.  180 tablet  3  . triamcinolone (NASACORT) 55 MCG/ACT AERO nasal inhaler Place 2 sprays into the nose daily.  3 Inhaler  3  . triamterene-hydrochlorothiazide (DYAZIDE) 37.5-25 MG per capsule Take  1 each (1 capsule total) by mouth daily.  90 capsule  3   No current facility-administered medications on file prior to visit.    BP 118/70  Pulse 112  Temp(Src) 102.4 F (39.1 C) (Oral)  Wt 203 lb (92.08 kg)  SpO2 95%chart    Objective:   Physical Exam  Constitutional: She is oriented to person, place, and time. She appears well-developed and well-nourished.  HENT:  Head: Normocephalic.  Right Ear: Tympanic membrane, external ear and ear canal normal.  Left Ear: Tympanic membrane, external ear and ear canal normal.  Nose: Nose normal.  Mouth/Throat: Uvula is midline and mucous membranes are normal. No posterior oropharyngeal erythema.  Eyes: Conjunctivae and lids are normal.  Neck: Normal range of motion. Neck supple.  Cardiovascular: Normal  rate, regular rhythm and normal heart sounds.   Pulmonary/Chest: Effort normal and breath sounds normal.  Abdominal: Soft. Normal appearance and bowel sounds are normal.  Neurological: She is alert and oriented to person, place, and time.  Skin: Skin is warm, dry and intact.  Psychiatric: She has a normal mood and affect. Her speech is normal and behavior is normal.          Assessment & Plan:  58 y.o. Black female presents with chief complaints of "cough, and my whole body hurts".  - Influenza: rapid swab was postive.   - Due to patient symptoms being started on Sunday, Antiviral use would be limited helpfulness.   - Tylenol 650mg  as needed for pain and fever.   - Guaifenesin with codeine for cough relief.  - Respiratory Infection: Due to prolonged respiratory infection being greater then 14 days will start patient on amoxicillin 2 tabs BID for 10 days to prevent further infection since patient is compromised currently.  - Education: [employer] frequently, drink at least 65 oz of water daily. Wear a mask to prevent spread of infection of influenza.  - Follow up: as needed.

## 2013-07-19 NOTE — Progress Notes (Signed)
Pre visit review using our clinic review tool, if applicable. No additional management support is needed unless otherwise documented below in the visit note. 

## 2013-07-29 ENCOUNTER — Ambulatory Visit (INDEPENDENT_AMBULATORY_CARE_PROVIDER_SITE_OTHER): Payer: 59 | Admitting: Family Medicine

## 2013-07-29 ENCOUNTER — Encounter: Payer: Self-pay | Admitting: Family Medicine

## 2013-07-29 VITALS — BP 118/80 | HR 75 | Temp 97.6°F | Wt 201.0 lb

## 2013-07-29 DIAGNOSIS — R05 Cough: Secondary | ICD-10-CM

## 2013-07-29 DIAGNOSIS — R059 Cough, unspecified: Secondary | ICD-10-CM

## 2013-07-29 MED ORDER — HYDROCODONE-HOMATROPINE 5-1.5 MG/5ML PO SYRP: ORAL_SOLUTION | ORAL | Status: DC

## 2013-07-29 MED ORDER — BENZONATATE 200 MG PO CAPS: 200.0000 mg | ORAL_CAPSULE | Freq: Three times a day (TID) | ORAL | Status: DC | PRN

## 2013-07-29 NOTE — Assessment & Plan Note (Signed)
Likely post infectious, should resolve very slowly.  D/w pt.  Use tessalon and cough syrup.  If worsening, needs recheck.  Okay for outpatient f/u.

## 2013-07-29 NOTE — Patient Instructions (Signed)
Use the pills and the syrup for the cough.  The syrup can make you drowsy.  This should slowly get better.  Take care.

## 2013-07-29 NOTE — Progress Notes (Signed)
Pre visit review using our clinic review tool, if applicable. No additional management support is needed unless otherwise documented below in the visit note.  Prev with flu positive testing 10 days ago.  Initially some help with hycodan, less with the cheratussin.  Done with abx currently.  She isn't worse now, but the cough continues.  The cough continues about the same as prev, but the fevers and aches are better.  Talking, cold air, deep breaths trigger the cough.  No vomiting, diarrhea, ear pain, ST.  If she wasn't coughing, then she'd be feeling well.    Meds, vitals, and allergies reviewed.   ROS: See HPI.  Otherwise, noncontributory.  GEN: nad, alert and oriented HEENT: mucous membranes moist, tm wnl, OP wnl NECK: supple w/o LA CV: rrr.   PULM: cough noted but ctab, no inc wob, no wheeze EXT: no edema SKIN: no acute rash

## 2013-11-14 ENCOUNTER — Ambulatory Visit (INDEPENDENT_AMBULATORY_CARE_PROVIDER_SITE_OTHER): Payer: 59 | Admitting: Internal Medicine

## 2013-11-14 ENCOUNTER — Encounter: Payer: Self-pay | Admitting: Internal Medicine

## 2013-11-14 VITALS — BP 120/70 | HR 93 | Temp 98.3°F | Resp 20 | Ht 66.25 in | Wt 205.0 lb

## 2013-11-14 DIAGNOSIS — I10 Essential (primary) hypertension: Secondary | ICD-10-CM

## 2013-11-14 DIAGNOSIS — J309 Allergic rhinitis, unspecified: Secondary | ICD-10-CM

## 2013-11-14 NOTE — Patient Instructions (Signed)
Limit your sodium (Salt) intake  Please check your blood pressure on a regular basis.  If it is consistently greater than 150/90, please make an office appointment.  Return in 6 months for follow-up   

## 2013-11-14 NOTE — Progress Notes (Signed)
Pre visit review using our clinic review tool, if applicable. No additional management support is needed unless otherwise documented below in the visit note. 

## 2013-11-14 NOTE — Progress Notes (Signed)
Subjective:    Patient ID: Morgan Wolfe, female    DOB: 1955/12/09, 58 y.o.   MRN: [id number]  HPI  58 year old patient who is seen today in followup.  She has treated hypertension and history of allergic rhinitis.  Complaints include some mild right knee pain onset yesterday while walking.  In general doing fairly well.  She was seen in the spring for a URI and did have some persistent cough.  Past Medical History  Diagnosis Date  . ALLERGIC RHINITIS   . Hypertension   . Right shoulder pain     History   Social History  . Marital Status: Single    Spouse Name: N/A    Number of Children: N/A  . Years of Education: N/A   Occupational History  . Not on file.   Social History Main Topics  . Smoking status: Never Smoker   . Smokeless tobacco: Never Used  . Alcohol Use: No  . Drug Use: No  . Sexual Activity: Not on file   Other Topics Concern  . Not on file   Social History Narrative  . No narrative on file    Past Surgical History  Procedure Laterality Date  . Ankle surgery  2000  . Toe surgery  1998  . Vaginal hysterectomy  1980    per Dr. Harrington Challenger for fibroids    Family History  Problem Relation Age of Onset  . Coronary artery disease      1st degress relatives <50  . Hypertension    . Stroke Brother   . Hyperlipidemia    . Colon cancer Neg Hx   . Stomach cancer Neg Hx     Allergies  Allergen Reactions  . Sulfonamide Derivatives Itching    Current Outpatient Prescriptions on File Prior to Visit  Medication Sig Dispense Refill  . estrogens, conjugated, (PREMARIN) 0.625 MG tablet TAKE 1 TABLET DAILY FOR 21 DAYS THEN DO NOT TAKE FOR 7 DAYS  90 tablet  3  . fexofenadine (ALLEGRA) 180 MG tablet Take 1 tablet (180 mg total) by mouth daily.  90 tablet  3  . meclizine (ANTIVERT) 25 MG tablet Take 1 tablet (25 mg total) by mouth 3 (three) times daily as needed for dizziness.  30 tablet  4  . naproxen (NAPROSYN) 500 MG tablet Take 1 tablet (500 mg total) by  mouth 2 (two) times daily with a meal.  60 tablet  1  . potassium chloride (K-DUR,KLOR-CON) 10 MEQ tablet Take 1 tablet (10 mEq total) by mouth 2 (two) times daily.  180 tablet  3  . triamcinolone (NASACORT) 55 MCG/ACT AERO nasal inhaler Place 2 sprays into the nose daily.  3 Inhaler  3  . triamterene-hydrochlorothiazide (DYAZIDE) 37.5-25 MG per capsule Take 1 each (1 capsule total) by mouth daily.  90 capsule  3   No current facility-administered medications on file prior to visit.    BP 120/70  Pulse 93  Temp(Src) 98.3 F (36.8 C) (Oral)  Resp 20  Ht 5' 6.25" (1.683 m)  Wt 205 lb (92.987 kg)  BMI 32.83 kg/m2  SpO2 98%       Review of Systems  Constitutional: Negative.   HENT: Negative for congestion, dental problem, hearing loss, rhinorrhea, sinus pressure, sore throat and tinnitus.   Eyes: Negative for pain, discharge and visual disturbance.  Respiratory: Positive for cough. Negative for shortness of breath.   Cardiovascular: Negative for chest pain, palpitations and leg swelling.  Gastrointestinal: Negative for nausea,  vomiting, abdominal pain, diarrhea, constipation, blood in stool and abdominal distention.  Genitourinary: Negative for dysuria, urgency, frequency, hematuria, flank pain, vaginal bleeding, vaginal discharge, difficulty urinating, vaginal pain and pelvic pain.  Musculoskeletal: Negative for arthralgias, gait problem and joint swelling.       Right knee pain  Skin: Negative for rash.  Neurological: Negative for dizziness, syncope, speech difficulty, weakness, numbness and headaches.  Hematological: Negative for adenopathy.  Psychiatric/Behavioral: Negative for behavioral problems, dysphoric mood and agitation. The patient is not nervous/anxious.        Objective:   Physical Exam  Constitutional: She is oriented to person, place, and time. She appears well-developed and well-nourished.  HENT:  Head: Normocephalic.  Right Ear: External ear normal.  Left  Ear: External ear normal.  Mouth/Throat: Oropharynx is clear and moist.  Eyes: Conjunctivae and EOM are normal. Pupils are equal, round, and reactive to light.  Neck: Normal range of motion. Neck supple. No thyromegaly present.  Cardiovascular: Normal rate, regular rhythm, normal heart sounds and intact distal pulses.   Pulmonary/Chest: Effort normal and breath sounds normal.  Abdominal: Soft. Bowel sounds are normal. She exhibits no mass. There is no tenderness.  Musculoskeletal: Normal range of motion.  Lymphadenopathy:    She has no cervical adenopathy.  Neurological: She is alert and oriented to person, place, and time.  Skin: Skin is warm and dry. No rash noted.  Psychiatric: She has a normal mood and affect. Her behavior is normal.          Assessment & Plan:   Hypertension well controlled  Allergic rhinitis Status post URI Mild right knee pain.   We'll continue naproxen when necessary  Recheck 6 months

## 2013-11-16 ENCOUNTER — Ambulatory Visit: Payer: 59 | Admitting: Internal Medicine

## 2013-12-11 ENCOUNTER — Telehealth: Payer: Self-pay | Admitting: Internal Medicine

## 2013-12-11 MED ORDER — TRIAMTERENE-HCTZ 37.5-25 MG PO CAPS: 1.0000 | ORAL_CAPSULE | Freq: Every day | ORAL | Status: DC

## 2013-12-11 NOTE — Telephone Encounter (Signed)
Pt states she has not received her [facility]s from [facility] and she is completely out, pt would like to know if she can get enough to last until her meds come. Pt need triamterene-hydrochlorothiazide (DYAZIDE) 37.5-25 MG per capsule, sent to [facility] on [address].

## 2013-12-11 NOTE — Telephone Encounter (Signed)
Pt notified Rx sent to pharmacy

## 2014-02-02 ENCOUNTER — Ambulatory Visit (INDEPENDENT_AMBULATORY_CARE_PROVIDER_SITE_OTHER): Payer: 59 | Admitting: Internal Medicine

## 2014-02-02 ENCOUNTER — Encounter: Payer: Self-pay | Admitting: Internal Medicine

## 2014-02-02 VITALS — BP 120/80 | HR 81 | Temp 98.1°F | Resp 20 | Ht 66.25 in | Wt 207.0 lb

## 2014-02-02 DIAGNOSIS — M542 Cervicalgia: Secondary | ICD-10-CM

## 2014-02-02 MED ORDER — METHYLPREDNISOLONE ACETATE 80 MG/ML IJ SUSP
80.0000 mg | Freq: Once | INTRAMUSCULAR | Status: AC
  Administered 2014-02-02: 80 mg via INTRAMUSCULAR

## 2014-02-02 MED ORDER — TRAMADOL HCL 50 MG PO TABS: 50.0000 mg | ORAL_TABLET | Freq: Three times a day (TID) | ORAL | Status: DC | PRN

## 2014-02-02 NOTE — Progress Notes (Signed)
Pre visit review using our clinic review tool, if applicable. No additional management support is needed unless otherwise documented below in the visit note. 

## 2014-02-02 NOTE — Progress Notes (Signed)
Subjective:    Patient ID: Morgan Wolfe, female    DOB: 05-Aug-1955, 58 y.o.   MRN: [id number]  HPI 58 year old patient who presents with a one-week history of the right lateral neck pain.  She also has pain involving her right wrist, hand, associated with some numbness of the fingers. 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  She states that all symptoms resolve on vacation She has treated hypertension, which has been stable She has been using Aleve without much benefit  Past Medical History  Diagnosis Date  . ALLERGIC RHINITIS   . Hypertension   . Right shoulder pain     History   Social History  . Marital Status: Single    Spouse Name: N/A    Number of Children: N/A  . Years of Education: N/A   Occupational History  . Not on file.   Social History Main Topics  . Smoking status: Never Smoker   . Smokeless tobacco: Never Used  . Alcohol Use: No  . Drug Use: No  . Sexual Activity: Not on file   Other Topics Concern  . Not on file   Social History Narrative  . No narrative on file    Past Surgical History  Procedure Laterality Date  . Ankle surgery  2000  . Toe surgery  1998  . Vaginal hysterectomy  1980    per Dr. Harrington Challenger for fibroids    Family History  Problem Relation Age of Onset  . Coronary artery disease      1st degress relatives <50  . Hypertension    . Stroke Brother   . Hyperlipidemia    . Colon cancer Neg Hx   . Stomach cancer Neg Hx     Allergies  Allergen Reactions  . Sulfonamide Derivatives Itching    Current Outpatient Prescriptions on File Prior to Visit  Medication Sig Dispense Refill  . estrogens, conjugated, (PREMARIN) 0.625 MG tablet TAKE 1 TABLET DAILY FOR 21 DAYS THEN DO NOT TAKE FOR 7 DAYS  90 tablet  3  . fexofenadine (ALLEGRA) 180 MG tablet Take 1 tablet (180 mg total) by mouth daily.  90 tablet  3  . meclizine (ANTIVERT) 25 MG tablet Take 1 tablet (25 mg total) by mouth 3  (three) times daily as needed for dizziness.  30 tablet  4  . naproxen (NAPROSYN) 500 MG tablet Take 1 tablet (500 mg total) by mouth 2 (two) times daily with a meal.  60 tablet  1  . potassium chloride (K-DUR,KLOR-CON) 10 MEQ tablet Take 1 tablet (10 mEq total) by mouth 2 (two) times daily.  180 tablet  3  . triamcinolone (NASACORT) 55 MCG/ACT AERO nasal inhaler Place 2 sprays into the nose daily.  3 Inhaler  3  . triamterene-hydrochlorothiazide (DYAZIDE) 37.5-25 MG per capsule Take 1 each (1 capsule total) by mouth daily.  30 capsule  0   No current facility-administered medications on file prior to visit.    BP 120/80  Pulse 81  Temp(Src) 98.1 F (36.7 C) (Oral)  Resp 20  Ht 5' 6.25" (1.683 m)  Wt 207 lb (93.895 kg)  BMI 33.15 kg/m2  SpO2 98%      Review of Systems  Constitutional: Negative.   HENT: Negative for congestion, dental problem, hearing loss, rhinorrhea, sinus pressure, sore throat and tinnitus.   Eyes: Negative for pain, discharge and visual disturbance.  Respiratory: Negative for cough and shortness of breath.  Cardiovascular: Negative for chest pain, palpitations and leg swelling.  Gastrointestinal: Negative for nausea, vomiting, abdominal pain, diarrhea, constipation, blood in stool and abdominal distention.  Genitourinary: Negative for dysuria, urgency, frequency, hematuria, flank pain, vaginal bleeding, vaginal discharge, difficulty urinating, vaginal pain and pelvic pain.  Musculoskeletal: Positive for neck pain and neck stiffness. Negative for arthralgias, gait problem and joint swelling.  Skin: Negative for rash.  Neurological: Negative for dizziness, syncope, speech difficulty, weakness, numbness and headaches.  Hematological: Negative for adenopathy.  Psychiatric/Behavioral: Negative for behavioral problems, dysphoric mood and agitation. The patient is not nervous/anxious.        Objective:   Physical Exam  Constitutional: She appears well-developed  and well-nourished. No distress.  Neck: Normal range of motion.  Tenderness right trapezius musculature  Musculoskeletal:  Negative Tinel's Normal.  Grip strength Reflexes blunted, but equal in upper extremities  Lymphadenopathy:    She has no cervical adenopathy.          Assessment & Plan:   Right neck pain, suspect overuse injury.  Probable mild right carpal tunnel syndrome, aggravated by repetitious activity.  We'll treat with Depo-Medrol rest, warm compresses, and a soft cervical collar.  We'll call if unimproved Hypertension stable

## 2014-02-02 NOTE — Patient Instructions (Signed)
You  may move around, but avoid painful motions and activities.  Apply heat to the sore area for 15 to 20 minutes 3 or 4 times daily for the next two to 3 days. 

## 2014-03-22 ENCOUNTER — Encounter: Payer: Self-pay | Admitting: Internal Medicine

## 2014-05-07 ENCOUNTER — Encounter: Payer: 59 | Admitting: Internal Medicine

## 2014-05-16 ENCOUNTER — Ambulatory Visit: Payer: 59 | Admitting: Internal Medicine

## 2014-05-28 ENCOUNTER — Other Ambulatory Visit: Payer: Self-pay | Admitting: Internal Medicine

## 2014-06-29 ENCOUNTER — Other Ambulatory Visit: Payer: Self-pay | Admitting: Internal Medicine

## 2014-07-28 ENCOUNTER — Encounter: Payer: Self-pay | Admitting: Family

## 2014-07-28 ENCOUNTER — Ambulatory Visit (INDEPENDENT_AMBULATORY_CARE_PROVIDER_SITE_OTHER): Payer: 59 | Admitting: Family

## 2014-07-28 VITALS — BP 120/80 | HR 108 | Temp 99.7°F | Resp 16 | Wt 208.0 lb

## 2014-07-28 DIAGNOSIS — R059 Cough, unspecified: Secondary | ICD-10-CM

## 2014-07-28 DIAGNOSIS — R05 Cough: Secondary | ICD-10-CM

## 2014-07-28 MED ORDER — PROMETHAZINE-DM 6.25-15 MG/5ML PO SYRP: 5.0000 mL | ORAL_SOLUTION | Freq: Four times a day (QID) | ORAL | Status: DC | PRN

## 2014-07-28 MED ORDER — AMOXICILLIN-POT CLAVULANATE 875-125 MG PO TABS: 1.0000 | ORAL_TABLET | Freq: Two times a day (BID) | ORAL | Status: DC

## 2014-07-28 NOTE — Patient Instructions (Signed)
Thank you for choosing [facility].  Summary/Instructions:  Your prescription(s) have been submitted to your pharmacy or been printed and provided for you. Please take as directed and contact our office if you believe you are having problem(s) with the medication(s) or have any questions.  If your symptoms worsen or fail to improve, please contact our office for further instruction, or in case of emergency go directly to the emergency room at the closest medical facility.   General Recommendations:    Please drink plenty of fluids.  Get plenty of rest   Sleep in humidified air  Use saline nasal sprays  Netti pot   OTC Medications:  Decongestants - helps relieve congestion   Flonase (generic fluticasone) or Nasacort (generic triamcinolone) - please make sure to use the "cross-over" technique at a 45 degree angle towards the opposite eye as opposed to straight up the nasal passageway.   Sudafed (generic pseudoephedrine - Note this is the one that is available behind the pharmacy counter); Products with phenylephrine (-PE) may also be used but is often not as effective as pseudoephedrine.   If you have HIGH BLOOD PRESSURE - Coricidin HBP; AVOID any product that is -D as this contains pseudoephedrine which may increase your blood pressure.  Afrin (oxymetazoline) every 6-8 hours for up to 3 days.   Allergies - helps relieve runny nose, itchy eyes and sneezing   Claritin (generic loratidine), Allegra (fexofenidine), or Zyrtec (generic cyrterizine) for runny nose. These medications should not cause drowsiness.  Note - Benadryl (generic diphenhydramine) may be used however may cause drowsiness  Cough -   Delsym or Robitussin (generic dextromethorphan)  Expectorants - helps loosen mucus to ease removal   Mucinex (generic guaifenesin) as directed on the package.  Headaches / General Aches   Tylenol (generic acetaminophen) - DO NOT EXCEED 3 grams (3,000 mg) in a 24  hour time period  Advil/Motrin (generic ibuprofen)   Sore Throat -   Salt water gargle   Chloraseptic (generic benzocaine) spray or lozenges / Sucrets (generic dyclonine)      

## 2014-07-28 NOTE — Assessment & Plan Note (Signed)
Symptoms and exam consistent with bacterial sinusitis. Start Augmentin. Start Promethazine DM as needed for cough and sleep. Continue over-the-counter medications as needed for symptom relief and supportive care. Follow-up if symptoms worsen or fail to improve.

## 2014-07-28 NOTE — Progress Notes (Signed)
Subjective:    Patient ID: BETHENE HANKINSON, female    DOB: 14-Aug-1955, 59 y.o.   MRN: [id number]  Chief Complaint  Patient presents with  . Cough    Pt reports nasal congestion since Wednesday. Some sinus drainage and cough. Has been taking hydrocodone cough syrup.    HPI:  MINDY GALI is a 59 y.o. female who presents today for an acute visit.   This is a new problem. Associated symptoms of nasal congestion, sinus drainage and cough has been going on for about 4 days. She has taken some hyrdocodone cough syrup and nasocort which has not seemed to help very much. TIming of symptoms is worse at night with coughing intensity enough to keep her up.  Denies any recent antibiotic use.    Allergies  Allergen Reactions  . Sulfonamide Derivatives Itching    Current Outpatient Prescriptions on File Prior to Visit  Medication Sig Dispense Refill  . fexofenadine (ALLEGRA) 180 MG tablet Take 1 tablet (180 mg total) by mouth daily. 90 tablet 3  . ibuprofen (ADVIL) 200 MG tablet Take 400 mg by mouth every 6 (six) hours as needed.    Marland Kitchen KLOR-CON 10 10 MEQ tablet TAKE 1 TABLET TWICE A DAY 180 tablet 3  . meclizine (ANTIVERT) 25 MG tablet Take 1 tablet (25 mg total) by mouth 3 (three) times daily as needed for dizziness. 30 tablet 4  . naproxen (NAPROSYN) 500 MG tablet Take 1 tablet (500 mg total) by mouth 2 (two) times daily with a meal. 60 tablet 1  . PREMARIN 0.625 MG tablet TAKE 1 TABLET DAILY FOR 21 DAYS THEN DO NOT TAKE FOR 7 DAYS 90 tablet 1  . traMADol (ULTRAM) 50 MG tablet Take 1 tablet (50 mg total) by mouth every 8 (eight) hours as needed. 30 tablet 0  . triamcinolone (NASACORT) 55 MCG/ACT AERO nasal inhaler Place 2 sprays into the nose daily. 3 Inhaler 3  . triamterene-hydrochlorothiazide (DYAZIDE) 37.5-25 MG per capsule Take 1 each (1 capsule total) by mouth daily. 30 capsule 0  . triamterene-hydrochlorothiazide (DYAZIDE) 37.5-25 MG per capsule TAKE 1 CAPSULE DAILY 90 capsule 1   No  current facility-administered medications on file prior to visit.     Review of Systems  Constitutional: Positive for fever and chills.  HENT: Positive for congestion, ear pain and sinus pressure.   Respiratory: Positive for cough and chest tightness. Negative for shortness of breath.   Neurological: Negative for headaches.      Objective:    BP 120/80 mmHg  Pulse 108  Temp(Src) 99.7 F (37.6 C) (Oral)  Resp 16  Wt 208 lb (94.348 kg)  SpO2 96% Nursing note and vital signs reviewed.  Physical Exam  Constitutional: She is oriented to person, place, and time. She appears well-developed and well-nourished. No distress.  HENT:  Right Ear: Hearing, tympanic membrane, external ear and ear canal normal.  Left Ear: Hearing, tympanic membrane, external ear and ear canal normal.  Nose: Right sinus exhibits frontal sinus tenderness. Right sinus exhibits no maxillary sinus tenderness. Left sinus exhibits frontal sinus tenderness. Left sinus exhibits no maxillary sinus tenderness.  Mouth/Throat: Uvula is midline, oropharynx is clear and moist and mucous membranes are normal.  Cardiovascular: Normal rate, regular rhythm, normal heart sounds and intact distal pulses.   Pulmonary/Chest: Effort normal and breath sounds normal.  Neurological: She is alert and oriented to person, place, and time.  Skin: Skin is warm and dry.  Psychiatric: She has a  normal mood and affect. Her behavior is normal. Judgment and thought content normal.       Assessment & Plan:

## 2014-07-30 ENCOUNTER — Ambulatory Visit (INDEPENDENT_AMBULATORY_CARE_PROVIDER_SITE_OTHER): Payer: 59 | Admitting: Internal Medicine

## 2014-07-30 ENCOUNTER — Encounter: Payer: Self-pay | Admitting: Internal Medicine

## 2014-07-30 VITALS — BP 140/90 | HR 94 | Temp 98.0°F | Resp 20 | Ht 66.25 in | Wt 206.0 lb

## 2014-07-30 DIAGNOSIS — J069 Acute upper respiratory infection, unspecified: Secondary | ICD-10-CM

## 2014-07-30 DIAGNOSIS — I10 Essential (primary) hypertension: Secondary | ICD-10-CM

## 2014-07-30 MED ORDER — HYDROCODONE-HOMATROPINE 5-1.5 MG/5ML PO SYRP: 5.0000 mL | ORAL_SOLUTION | Freq: Four times a day (QID) | ORAL | Status: DC | PRN

## 2014-07-30 NOTE — Progress Notes (Signed)
Subjective:    Patient ID: Morgan Wolfe, female    DOB: December 20, 1955, 59 y.o.   MRN: [id number]  HPI  59 year old patient who is seen today for follow-up of hypertension.  She was seen 2 days ago for a acute visit.  For the past 4 days she has had chest congestion, head congestion and cough.  She has been using Hycodan with some benefit.  She was placed on Augmentin for a possible sinus infection which she did not tolerate due to nausea.  Wt Readings from Last 3 Encounters:  07/30/14 206 lb (93.441 kg)  07/28/14 208 lb (94.348 kg)  02/02/14 207 lb (93.895 kg)    Past Medical History  Diagnosis Date  . ALLERGIC RHINITIS   . Hypertension   . Right shoulder pain     History   Social History  . Marital Status: Single    Spouse Name: N/A  . Number of Children: N/A  . Years of Education: N/A   Occupational History  . Not on file.   Social History Main Topics  . Smoking status: Never Smoker   . Smokeless tobacco: Never Used  . Alcohol Use: No  . Drug Use: No  . Sexual Activity: Not on file   Other Topics Concern  . Not on file   Social History Narrative    Past Surgical History  Procedure Laterality Date  . Ankle surgery  2000  . Toe surgery  1998  . Vaginal hysterectomy  1980    per Dr. Harrington Challenger for fibroids    Family History  Problem Relation Age of Onset  . Coronary artery disease      1st degress relatives <50  . Hypertension    . Stroke Brother   . Hyperlipidemia    . Colon cancer Neg Hx   . Stomach cancer Neg Hx     Allergies  Allergen Reactions  . Augmentin [Amoxicillin-Pot Clavulanate] Nausea And Vomiting    Diarrhea   . Sulfonamide Derivatives Itching    Current Outpatient Prescriptions on File Prior to Visit  Medication Sig Dispense Refill  . fexofenadine (ALLEGRA) 180 MG tablet Take 1 tablet (180 mg total) by mouth daily. 90 tablet 3  . ibuprofen (ADVIL) 200 MG tablet Take 400 mg by mouth every 6 (six) hours as needed.    Marland Kitchen KLOR-CON 10 10  MEQ tablet TAKE 1 TABLET TWICE A DAY 180 tablet 3  . meclizine (ANTIVERT) 25 MG tablet Take 1 tablet (25 mg total) by mouth 3 (three) times daily as needed for dizziness. 30 tablet 4  . naproxen (NAPROSYN) 500 MG tablet Take 1 tablet (500 mg total) by mouth 2 (two) times daily with a meal. 60 tablet 1  . PREMARIN 0.625 MG tablet TAKE 1 TABLET DAILY FOR 21 DAYS THEN DO NOT TAKE FOR 7 DAYS 90 tablet 1  . triamcinolone (NASACORT) 55 MCG/ACT AERO nasal inhaler Place 2 sprays into the nose daily. 3 Inhaler 3  . triamterene-hydrochlorothiazide (DYAZIDE) 37.5-25 MG per capsule TAKE 1 CAPSULE DAILY 90 capsule 1   No current facility-administered medications on file prior to visit.    BP 140/90 mmHg  Pulse 94  Temp(Src) 98 F (36.7 C) (Oral)  Resp 20  Ht 5' 6.25" (1.683 m)  Wt 206 lb (93.441 kg)  BMI 32.99 kg/m2  SpO2 97%     Review of Systems  Constitutional: Positive for activity change, appetite change and fatigue.  HENT: Positive for congestion. Negative for dental problem, hearing  loss, rhinorrhea, sinus pressure, sore throat and tinnitus.   Eyes: Negative for pain, discharge and visual disturbance.  Respiratory: Positive for cough. Negative for shortness of breath.   Cardiovascular: Negative for chest pain, palpitations and leg swelling.  Gastrointestinal: Negative for nausea, vomiting, abdominal pain, diarrhea, constipation, blood in stool and abdominal distention.  Genitourinary: Negative for dysuria, urgency, frequency, hematuria, flank pain, vaginal bleeding, vaginal discharge, difficulty urinating, vaginal pain and pelvic pain.  Musculoskeletal: Negative for joint swelling, arthralgias and gait problem.  Skin: Negative for rash.  Neurological: Negative for dizziness, syncope, speech difficulty, weakness, numbness and headaches.  Hematological: Negative for adenopathy.  Psychiatric/Behavioral: Negative for behavioral problems, dysphoric mood and agitation. The patient is not  nervous/anxious.        Objective:   Physical Exam  Constitutional: She is oriented to person, place, and time. She appears well-developed and well-nourished.  HENT:  Head: Normocephalic.  Right Ear: External ear normal.  Left Ear: External ear normal.  Mouth/Throat: Oropharynx is clear and moist.  Eyes: Conjunctivae and EOM are normal. Pupils are equal, round, and reactive to light.  Neck: Normal range of motion. Neck supple. No thyromegaly present.  Cardiovascular: Normal rate, regular rhythm, normal heart sounds and intact distal pulses.   Pulmonary/Chest: Effort normal and breath sounds normal.  Abdominal: Soft. Bowel sounds are normal. She exhibits no mass. There is no tenderness.  Musculoskeletal: Normal range of motion.  Lymphadenopathy:    She has no cervical adenopathy.  Neurological: She is alert and oriented to person, place, and time.  Skin: Skin is warm and dry. No rash noted.  Psychiatric: She has a normal mood and affect. Her behavior is normal.          Assessment & Plan:   Viral URI with cough.  Will treat symptomatically Hypertension, well-controlled.  Repeat blood pressure 120/80  Recheck 6 months Patient instructions discussed and dispensed

## 2014-07-30 NOTE — Patient Instructions (Signed)
  Acute bronchitis symptoms for less than 10 days are generally not helped by antibiotics.  Take over-the-counter expectorants and cough medications such as  Mucinex DM.  Call if there is no improvement in 5 to 7 days or if  you develop worsening cough, fever, or new symptoms, such as shortness of breath or chest pain.  TREATMENT  Acute bronchitis usually goes away in a couple weeks. Oftentimes, no medical treatment is necessary. Medicines are sometimes given for relief of fever or cough. Antibiotic medicines are usually not needed but may be prescribed in certain situations. In some cases, an inhaler may be recommended to help reduce shortness of breath and control the cough. A cool mist vaporizer may also be used to help thin bronchial secretions and make it easier to clear the chest.  HOME CARE INSTRUCTIONS  Get plenty of rest.  Drink enough fluids to keep your urine clear or pale yellow (unless you have a medical condition that requires fluid restriction). Increasing fluids may help thin your respiratory secretions (sputum) and reduce chest congestion, and it will prevent dehydration.  Take medicines only as directed by your health care provider.  If you were prescribed an antibiotic medicine, finish it all even if you start to feel better.  Avoid smoking and secondhand smoke. Exposure to cigarette smoke or irritating chemicals will make bronchitis worse. If you are a smoker, consider using nicotine gum or skin patches to help control withdrawal symptoms. Quitting smoking will help your lungs heal faster.  Reduce the chances of another bout of acute bronchitis by washing your hands frequently, avoiding people with cold symptoms, and trying not to touch your hands to your mouth, nose, or eyes.  Keep all follow-up visits as directed by your health care provider.  SEEK MEDICAL CARE IF:  Your symptoms do not improve after 1 week of treatment.

## 2014-11-18 ENCOUNTER — Other Ambulatory Visit: Payer: Self-pay | Admitting: Internal Medicine

## 2014-11-20 ENCOUNTER — Ambulatory Visit (INDEPENDENT_AMBULATORY_CARE_PROVIDER_SITE_OTHER): Payer: 59 | Admitting: Internal Medicine

## 2014-11-20 ENCOUNTER — Encounter: Payer: Self-pay | Admitting: Internal Medicine

## 2014-11-20 VITALS — BP 140/80 | HR 97 | Temp 98.5°F | Resp 20 | Ht 66.25 in | Wt 207.0 lb

## 2014-11-20 DIAGNOSIS — R1084 Generalized abdominal pain: Secondary | ICD-10-CM

## 2014-11-20 DIAGNOSIS — I10 Essential (primary) hypertension: Secondary | ICD-10-CM | POA: Diagnosis not present

## 2014-11-20 NOTE — Progress Notes (Signed)
Pre visit review using our clinic review tool, if applicable. No additional management support is needed unless otherwise documented below in the visit note. 

## 2014-11-20 NOTE — Progress Notes (Signed)
Subjective:    Patient ID: Morgan Wolfe, female    DOB: 1956-04-23, 59 y.o.   MRN: [id number]  HPI   59 year old patient who has essential hypertension. She presents with a three-day history of intermittent discomfort. This is maximal in the right flank area, but has also involve the low back and left flank discomfort. No other associated symptoms such as nausea, vomiting or change in her bowel habits. Symptoms occur almost predominantly at night and she does well throughout the day she has been using naproxen with some benefit. Today she feels well.  Past Medical History  Diagnosis Date  . ALLERGIC RHINITIS   . Hypertension   . Right shoulder pain     History   Social History  . Marital Status: Single    Spouse Name: N/A  . Number of Children: N/A  . Years of Education: N/A   Occupational History  . Not on file.   Social History Main Topics  . Smoking status: Never Smoker   . Smokeless tobacco: Never Used  . Alcohol Use: No  . Drug Use: No  . Sexual Activity: Not on file   Other Topics Concern  . Not on file   Social History Narrative    Past Surgical History  Procedure Laterality Date  . Ankle surgery  2000  . Toe surgery  1998  . Vaginal hysterectomy  1980    per Dr. Harrington Challenger for fibroids    Family History  Problem Relation Age of Onset  . Coronary artery disease      1st degress relatives <50  . Hypertension    . Stroke Brother   . Hyperlipidemia    . Colon cancer Neg Hx   . Stomach cancer Neg Hx     Allergies  Allergen Reactions  . Augmentin [Amoxicillin-Pot Clavulanate] Nausea And Vomiting    Diarrhea   . Sulfonamide Derivatives Itching    Current Outpatient Prescriptions on File Prior to Visit  Medication Sig Dispense Refill  . fexofenadine (ALLEGRA) 180 MG tablet Take 1 tablet (180 mg total) by mouth daily. 90 tablet 3  . HYDROcodone-homatropine (HYCODAN) 5-1.5 MG/5ML syrup Take 5 mLs by mouth every 6 (six) hours as needed for cough. 120 mL  0  . ibuprofen (ADVIL) 200 MG tablet Take 400 mg by mouth every 6 (six) hours as needed.    Marland Kitchen KLOR-CON 10 10 MEQ tablet TAKE 1 TABLET TWICE A DAY 180 tablet 3  . meclizine (ANTIVERT) 25 MG tablet Take 1 tablet (25 mg total) by mouth 3 (three) times daily as needed for dizziness. 30 tablet 4  . naproxen (NAPROSYN) 500 MG tablet Take 1 tablet (500 mg total) by mouth 2 (two) times daily with a meal. 60 tablet 1  . PREMARIN 0.625 MG tablet TAKE 1 TABLET DAILY FOR 21 DAYS THEN DO NOT TAKE FOR 7 DAYS 90 tablet 1  . triamcinolone (NASACORT) 55 MCG/ACT AERO nasal inhaler Place 2 sprays into the nose daily. 3 Inhaler 3  . triamterene-hydrochlorothiazide (DYAZIDE) 37.5-25 MG per capsule TAKE 1 CAPSULE DAILY 90 capsule 1   No current facility-administered medications on file prior to visit.    BP 140/80 mmHg  Pulse 97  Temp(Src) 98.5 F (36.9 C) (Oral)  Resp 20  Ht 5' 6.25" (1.683 m)  Wt 207 lb (93.895 kg)  BMI 33.15 kg/m2  SpO2 98%     Review of Systems  Constitutional: Negative.   HENT: Negative for congestion, dental problem, hearing loss, rhinorrhea,  sinus pressure, sore throat and tinnitus.   Eyes: Negative for pain, discharge and visual disturbance.  Respiratory: Negative for cough and shortness of breath.   Cardiovascular: Negative for chest pain, palpitations and leg swelling.  Gastrointestinal: Positive for abdominal pain. Negative for nausea, vomiting, diarrhea, constipation, blood in stool and abdominal distention.  Genitourinary: Negative for dysuria, urgency, frequency, hematuria, flank pain, vaginal bleeding, vaginal discharge, difficulty urinating, vaginal pain and pelvic pain.  Musculoskeletal: Negative for joint swelling, arthralgias and gait problem.  Skin: Negative for rash.  Neurological: Negative for dizziness, syncope, speech difficulty, weakness, numbness and headaches.  Hematological: Negative for adenopathy.  Psychiatric/Behavioral: Negative for behavioral problems,  dysphoric mood and agitation. The patient is not nervous/anxious.        Objective:   Physical Exam  Constitutional: She is oriented to person, place, and time. She appears well-developed and well-nourished.  HENT:  Head: Normocephalic.  Right Ear: External ear normal.  Left Ear: External ear normal.  Mouth/Throat: Oropharynx is clear and moist.  Eyes: Conjunctivae and EOM are normal. Pupils are equal, round, and reactive to light.  Neck: Normal range of motion. Neck supple. No thyromegaly present.  Cardiovascular: Normal rate, regular rhythm, normal heart sounds and intact distal pulses.   Pulmonary/Chest: Effort normal and breath sounds normal.  Abdominal: Soft. Bowel sounds are normal. She exhibits no mass. There is no tenderness.  Musculoskeletal: Normal range of motion.  Negative straight leg test Full range of motion in both hips No CVA tenderness GI examination normal  Lymphadenopathy:    She has no cervical adenopathy.  Neurological: She is alert and oriented to person, place, and time.  Skin: Skin is warm and dry. No rash noted.  Psychiatric: She has a normal mood and affect. Her behavior is normal.          Assessment & Plan:   Nonspecific low back and flank discomfort.  Clinically stable at present Essential hypertension, well-controlled.  Repeat blood pressure 118/70  We'll clinically observe at this time.  The patient will report any persistent new or worsening symptoms

## 2014-11-20 NOTE — Patient Instructions (Signed)
Limit your sodium (Salt) intake  Report any worsening or new symptoms

## 2014-12-26 ENCOUNTER — Other Ambulatory Visit: Payer: Self-pay | Admitting: Internal Medicine

## 2015-02-04 ENCOUNTER — Ambulatory Visit: Payer: 59 | Admitting: Internal Medicine

## 2015-02-06 ENCOUNTER — Encounter: Payer: Self-pay | Admitting: Internal Medicine

## 2015-02-06 ENCOUNTER — Ambulatory Visit (INDEPENDENT_AMBULATORY_CARE_PROVIDER_SITE_OTHER): Payer: 59 | Admitting: Internal Medicine

## 2015-02-06 VITALS — BP 110/70 | HR 74 | Temp 98.3°F | Resp 20 | Ht 66.0 in | Wt 208.0 lb

## 2015-02-06 DIAGNOSIS — Z0000 Encounter for general adult medical examination without abnormal findings: Secondary | ICD-10-CM | POA: Diagnosis not present

## 2015-02-06 DIAGNOSIS — I10 Essential (primary) hypertension: Secondary | ICD-10-CM

## 2015-02-06 DIAGNOSIS — J3089 Other allergic rhinitis: Secondary | ICD-10-CM

## 2015-02-06 LAB — TSH: TSH: 1.31 u[IU]/mL (ref 0.35–4.50)

## 2015-02-06 LAB — COMPREHENSIVE METABOLIC PANEL
ALBUMIN: 4 g/dL (ref 3.5–5.2)
ALK PHOS: 62 U/L (ref 39–117)
ALT: 14 U/L (ref 0–35)
AST: 17 U/L (ref 0–37)
BUN: 15 mg/dL (ref 6–23)
CO2: 35 mEq/L — ABNORMAL HIGH (ref 19–32)
Calcium: 9.4 mg/dL (ref 8.4–10.5)
Chloride: 97 mEq/L (ref 96–112)
Creatinine, Ser: 0.85 mg/dL (ref 0.40–1.20)
GFR: 88.63 mL/min (ref >60.00)
Glucose, Bld: 84 mg/dL (ref 70–99)
POTASSIUM: 2.9 meq/L — AB (ref 3.5–5.1)
SODIUM: 139 meq/L (ref 135–145)
TOTAL PROTEIN: 8 g/dL (ref 6.0–8.3)
Total Bilirubin: 0.7 mg/dL (ref 0.2–1.2)

## 2015-02-06 LAB — CBC WITH DIFFERENTIAL/PLATELET
Basophils Absolute: 0.1 10*3/uL (ref 0.0–0.1)
Basophils Relative: 0.6 % (ref 0.0–3.0)
EOS ABS: 0.1 10*3/uL (ref 0.0–0.7)
EOS PCT: 1.6 % (ref 0.0–5.0)
HCT: 38.3 % (ref 36.0–46.0)
HEMOGLOBIN: 12.8 g/dL (ref 12.0–15.0)
Lymphocytes Relative: 48.5 % — ABNORMAL HIGH (ref 12.0–46.0)
Lymphs Abs: 4.1 10*3/uL — ABNORMAL HIGH (ref 0.7–4.0)
MCHC: 33.4 g/dL (ref 30.0–36.0)
MCV: 85 fl (ref 78.0–100.0)
MONO ABS: 0.6 10*3/uL (ref 0.1–1.0)
Monocytes Relative: 7.2 % (ref 3.0–12.0)
Neutro Abs: 3.6 10*3/uL (ref 1.4–7.7)
Neutrophils Relative %: 42.1 % — ABNORMAL LOW (ref 43.0–77.0)
Platelets: 332 10*3/uL (ref 150.0–400.0)
RBC: 4.51 Mil/uL (ref 3.87–5.11)
RDW: 13.4 % (ref 11.5–15.5)
WBC: 8.5 10*3/uL (ref 4.0–10.5)

## 2015-02-06 LAB — LIPID PANEL
CHOLESTEROL: 179 mg/dL (ref 0–200)
HDL: 61.8 mg/dL (ref >39.00)
LDL Cholesterol: 84 mg/dL (ref 0–99)
NonHDL: 116.84
TRIGLYCERIDES: 162 mg/dL — AB (ref 0.0–149.0)
Total CHOL/HDL Ratio: 3
VLDL: 32.4 mg/dL (ref 0.0–40.0)

## 2015-02-06 MED ORDER — HYDROCODONE-HOMATROPINE 5-1.5 MG/5ML PO SYRP: 5.0000 mL | ORAL_SOLUTION | Freq: Four times a day (QID) | ORAL | Status: DC | PRN

## 2015-02-06 MED ORDER — TRIAMCINOLONE ACETONIDE 55 MCG/ACT NA AERO: 2.0000 | INHALATION_SPRAY | Freq: Every day | NASAL | Status: DC

## 2015-02-06 MED ORDER — FEXOFENADINE HCL 180 MG PO TABS: 180.0000 mg | ORAL_TABLET | Freq: Every day | ORAL | Status: DC

## 2015-02-06 NOTE — Progress Notes (Signed)
Subjective:    Patient ID: Morgan Wolfe, female    DOB: 1955-09-01, 59 y.o.   MRN: [id number]  HPI     Subjective:    Patient ID: Morgan Wolfe, female    DOB: 1955-07-18, 59 y.o.   MRN: [id number]  HPI  BP Readings from Last 3 Encounters:  02/06/15 110/70  11/20/14 140/80  07/30/14 140/90   Wt Readings from Last 3 Encounters:  02/06/15 208 lb (94.348 kg)  11/20/14 207 lb (93.895 kg)  07/30/14 206 lb (93.37 kg)   59 year old patient who is seen today for a wellness exam.  Medical problems include treated hypertension. This has been treated with diuretic therapy. Did have a screening colonoscopy in 2013.  This revealed hyperplastic polyps only Only complaint today is right lateral heel pain.  This has been present for about 2 months  Past Medical History  Diagnosis Date  . ALLERGIC RHINITIS   . Hypertension   . Right shoulder pain     Social History   Social History  . Marital Status: Single    Spouse Name: N/A  . Number of Children: N/A  . Years of Education: N/A   Occupational History  . Not on file.   Social History Main Topics  . Smoking status: Never Smoker   . Smokeless tobacco: Never Used  . Alcohol Use: No  . Drug Use: No  . Sexual Activity: Not on file   Other Topics Concern  . Not on file   Social History Narrative    Past Surgical History  Procedure Laterality Date  . Ankle surgery  2000  . Toe surgery  1998  . Vaginal hysterectomy  1980    per Dr. Harrington Challenger for fibroids    Family History  Problem Relation Age of Onset  . Coronary artery disease      1st degress relatives <50  . Hypertension    . Stroke Brother   . Hyperlipidemia    . Colon cancer Neg Hx   . Stomach cancer Neg Hx     Allergies  Allergen Reactions  . Augmentin [Amoxicillin-Pot Clavulanate] Nausea And Vomiting    Diarrhea   . Sulfonamide Derivatives Itching    Current Outpatient Prescriptions on File Prior to Visit  Medication Sig Dispense Refill  .  fexofenadine (ALLEGRA) 180 MG tablet Take 1 tablet (180 mg total) by mouth daily. 90 tablet 3  . HYDROcodone-homatropine (HYCODAN) 5-1.5 MG/5ML syrup Take 5 mLs by mouth every 6 (six) hours as needed for cough. 120 mL 0  . ibuprofen (ADVIL) 200 MG tablet Take 400 mg by mouth every 6 (six) hours as needed.    Marland Kitchen KLOR-CON 10 10 MEQ tablet TAKE 1 TABLET TWICE A DAY 180 tablet 3  . meclizine (ANTIVERT) 25 MG tablet Take 1 tablet (25 mg total) by mouth 3 (three) times daily as needed for dizziness. 30 tablet 4  . naproxen (NAPROSYN) 500 MG tablet Take 1 tablet (500 mg total) by mouth 2 (two) times daily with a meal. 60 tablet 1  . PREMARIN 0.625 MG tablet TAKE 1 TABLET DAILY FOR 21 DAYS THEN DO NOT TAKE FOR 7 DAYS 90 tablet 2  . triamcinolone (NASACORT) 55 MCG/ACT AERO nasal inhaler Place 2 sprays into the nose daily. 3 Inhaler 3  . triamterene-hydrochlorothiazide (DYAZIDE) 37.5-25 MG per capsule TAKE 1 CAPSULE DAILY 90 capsule 1   No current facility-administered medications on file prior to visit.    BP 110/70 mmHg  Pulse 74  Temp(Src) 98.3 F (36.8 C) (Oral)  Resp 20  Ht 5\' 6"  (1.676 m)  Wt 208 lb (94.348 kg)  BMI 33.59 kg/m2  SpO2 98%     Review of Systems  Constitutional: Negative for fever, appetite change, fatigue and unexpected weight change.  HENT: Negative for hearing loss, ear pain, nosebleeds, congestion, sore throat, mouth sores, trouble swallowing, neck stiffness, dental problem, voice change, sinus pressure and tinnitus.   Eyes: Negative for photophobia, pain, redness and visual disturbance.  Respiratory: Negative for cough, chest tightness and shortness of breath.   Cardiovascular: Negative for chest pain, palpitations and leg swelling.  Gastrointestinal: Negative for nausea, vomiting, abdominal pain, diarrhea, constipation, blood in stool, abdominal distention and rectal pain.  Genitourinary: Negative for dysuria, urgency, frequency, hematuria, flank pain, vaginal  bleeding, vaginal discharge, difficulty urinating, genital sores, vaginal pain, menstrual problem and pelvic pain.  Musculoskeletal: Positive for arthralgias. Negative for back pain.    Skin: Negative for rash.  Neurological: Positive for dizziness. Negative for syncope, speech difficulty, weakness, light-headedness, numbness and headaches.  Hematological: Negative for adenopathy. Does not bruise/bleed easily.  Psychiatric/Behavioral: Negative for suicidal ideas, behavioral problems, self-injury, dysphoric mood and agitation. The patient is not nervous/anxious.        Objective:   Physical Exam  Constitutional: She is oriented to person, place, and time. She appears well-developed and well-nourished.  HENT:  Head: Normocephalic and atraumatic.  Right Ear: External ear normal.  Left Ear: External ear normal.  Mouth/Throat: Oropharynx is clear and moist.  Eyes: Conjunctivae and EOM are normal.  Neck: Normal range of motion. Neck supple. No JVD present. No thyromegaly present.  Cardiovascular: Normal rate, regular rhythm, normal heart sounds and intact distal pulses.   No murmur heard. Pulmonary/Chest: Effort normal and breath sounds normal. She has no wheezes. She has no rales.  Abdominal: Soft. Bowel sounds are normal. She exhibits no distension and no mass. There is no tenderness. There is no rebound and no guarding.  Musculoskeletal: Normal range of motion. She exhibits no edema and no tenderness.  Neurological: She is alert and oriented to person, place, and time. She has normal reflexes. No cranial nerve deficit. She exhibits normal muscle tone. Coordination normal.  Skin: Skin is warm and dry. No rash noted.  Psychiatric: She has a normal mood and affect. Her behavior is normal.          Assessment & Plan:  Preventive health exam  Hypertension well controlled. Repeat blood pressure 120/76 Mild obesity. Weight loss encouraged  Home blood pressure monitoring  encouraged Recheck 6 months  Review of Systems Right lateral heel pain    Objective:   Physical Exam  Neck: Thyromegaly present.    Slight tenderness right lateral heel      Assessment & Plan:   Preventive health examination Hypertension, well-controlled Mild right plantar fasciitis.  Patient information dispensed.  Continue naproxen when necessary proper foot care.  His cast  Continue home blood pressure monitoring Mammogram October Review updated lab Recheck one year or as needed

## 2015-02-06 NOTE — Progress Notes (Signed)
Pre visit review using our clinic review tool, if applicable. No additional management support is needed unless otherwise documented below in the visit note. 

## 2015-02-06 NOTE — Patient Instructions (Signed)
Limit your sodium (Salt) intake  Please check your blood pressure on a regular basis.  If it is consistently greater than 150/90, please make an office appointment.    It is important that you exercise regularly, at least 20 minutes 3 to 4 times per week.  If you develop chest pain or shortness of breath seek  medical attention.  You need to lose weight.  Consider a lower calorie diet and regular exercise.   Schedule your mammogram.  Annually  Health Maintenance Adopting a healthy lifestyle and getting preventive care can go a long way to promote health and wellness. Talk with your health care provider about what schedule of regular examinations is right for you. This is a good chance for you to check in with your provider about disease prevention and staying healthy. In between checkups, there are plenty of things you can do on your own. Experts have done a lot of research about which lifestyle changes and preventive measures are most likely to keep you healthy. Ask your health care provider for more information. WEIGHT AND DIET  Eat a healthy diet  Be sure to include plenty of vegetables, fruits, low-fat dairy products, and lean protein.  Do not eat a lot of foods high in solid fats, added sugars, or salt.  Get regular exercise. This is one of the most important things you can do for your health.  Most adults should exercise for at least 150 minutes each week. The exercise should increase your heart rate and make you sweat (moderate-intensity exercise).  Most adults should also do strengthening exercises at least twice a week. This is in addition to the moderate-intensity exercise.  Maintain a healthy weight  Body mass index (BMI) is a measurement that can be used to identify possible weight problems. It estimates body fat based on height and weight. Your health care provider can help determine your BMI and help you achieve or maintain a healthy weight.  For females 18 years of age  and older:   A BMI below 18.5 is considered underweight.  A BMI of 18.5 to 24.9 is normal.  A BMI of 25 to 29.9 is considered overweight.  A BMI of 30 and above is considered obese.  Watch levels of cholesterol and blood lipids  You should start having your blood tested for lipids and cholesterol at 57 years of age, then have this test every 5 years.  You may need to have your cholesterol levels checked more often if:  Your lipid or cholesterol levels are high.  You are older than 57 years of age.  You are at high risk for heart disease.  CANCER SCREENING   Lung Cancer  Lung cancer screening is recommended for adults 40-71 years old who are at high risk for lung cancer because of a history of smoking.  A yearly low-dose CT scan of the lungs is recommended for people who:  Currently smoke.  Have quit within the past 15 years.  Have at least a 30-pack-year history of smoking. A pack year is smoking an average of one pack of cigarettes a day for 1 year.  Yearly screening should continue until it has been 15 years since you quit.  Yearly screening should stop if you develop a health problem that would prevent you from having lung cancer treatment.  Breast Cancer  Practice breast self-awareness. This means understanding how your breasts normally appear and feel.  It also means doing regular breast self-exams. Let your health care  provider know about any changes, no matter how small.  If you are in your 20s or 30s, you should have a clinical breast exam (CBE) by a health care provider every 1-3 years as part of a regular health exam.  If you are 57 or older, have a CBE every year. Also consider having a breast X-ray (mammogram) every year.  If you have a family history of breast cancer, talk to your health care provider about genetic screening.  If you are at high risk for breast cancer, talk to your health care provider about having an MRI and a mammogram every  year.  Breast cancer gene (BRCA) assessment is recommended for women who have family members with BRCA-related cancers. BRCA-related cancers include:  Breast.  Ovarian.  Tubal.  Peritoneal cancers.  Results of the assessment will determine the need for genetic counseling and BRCA1 and BRCA2 testing. Cervical Cancer Routine pelvic examinations to screen for cervical cancer are no longer recommended for nonpregnant women who are considered low risk for cancer of the pelvic organs (ovaries, uterus, and vagina) and who do not have symptoms. A pelvic examination may be necessary if you have symptoms including those associated with pelvic infections. Ask your health care provider if a screening pelvic exam is right for you.   The Pap test is the screening test for cervical cancer for women who are considered at risk.  If you had a hysterectomy for a problem that was not cancer or a condition that could lead to cancer, then you no longer need Pap tests.  If you are older than 57 years, and you have had normal Pap tests for the past 10 years, you no longer need to have Pap tests.  If you have had past treatment for cervical cancer or a condition that could lead to cancer, you need Pap tests and screening for cancer for at least 20 years after your treatment.  If you no longer get a Pap test, assess your risk factors if they change (such as having a new sexual partner). This can affect whether you should start being screened again.  Some women have medical problems that increase their chance of getting cervical cancer. If this is the case for you, your health care provider may recommend more frequent screening and Pap tests.  The human papillomavirus (HPV) test is another test that may be used for cervical cancer screening. The HPV test looks for the virus that can cause cell changes in the cervix. The cells collected during the Pap test can be tested for HPV.  The HPV test can be used to screen  women 57 years of age and older. Getting tested for HPV can extend the interval between normal Pap tests from three to five years.  An HPV test also should be used to screen women of any age who have unclear Pap test results.  After 57 years of age, women should have HPV testing as often as Pap tests.  Colorectal Cancer  This type of cancer can be detected and often prevented.  Routine colorectal cancer screening usually begins at 57 years of age and continues through 57 years of age.  Your health care provider may recommend screening at an earlier age if you have risk factors for colon cancer.  Your health care provider may also recommend using home test kits to check for hidden blood in the stool.  A small camera at the end of a tube can be used to examine your colon  directly (sigmoidoscopy or colonoscopy). This is done to check for the earliest forms of colorectal cancer.  Routine screening usually begins at age 2.  Direct examination of the colon should be repeated every 5-10 years through 57 years of age. However, you may need to be screened more often if early forms of precancerous polyps or small growths are found. Skin Cancer  Check your skin from head to toe regularly.  Tell your health care provider about any new moles or changes in moles, especially if there is a change in a mole's shape or color.  Also tell your health care provider if you have a mole that is larger than the size of a pencil eraser.  Always use sunscreen. Apply sunscreen liberally and repeatedly throughout the day.  Protect yourself by wearing long sleeves, pants, a wide-brimmed hat, and sunglasses whenever you are outside. HEART DISEASE, DIABETES, AND HIGH BLOOD PRESSURE   Have your blood pressure checked at least every 1-2 years. High blood pressure causes heart disease and increases the risk of stroke.  If you are between 17 years and 65 years old, ask your health care provider if you should take  aspirin to prevent strokes.  Have regular diabetes screenings. This involves taking a blood sample to check your fasting blood sugar level.  If you are at a normal weight and have a low risk for diabetes, have this test once every three years after 57 years of age.  If you are overweight and have a high risk for diabetes, consider being tested at a younger age or more often. PREVENTING INFECTION  Hepatitis B  If you have a higher risk for hepatitis B, you should be screened for this virus. You are considered at high risk for hepatitis B if:  You were born in a country where hepatitis B is common. Ask your health care provider which countries are considered high risk.  Your parents were born in a high-risk country, and you have not been immunized against hepatitis B (hepatitis B vaccine).  You have HIV or AIDS.  You use needles to inject street drugs.  You live with someone who has hepatitis B.  You have had sex with someone who has hepatitis B.  You get hemodialysis treatment.  You take certain medicines for conditions, including cancer, organ transplantation, and autoimmune conditions. Hepatitis C  Blood testing is recommended for:  Everyone born from [birth year] through 1963.  Anyone with known risk factors for hepatitis C. Sexually transmitted infections (STIs)  You should be screened for sexually transmitted infections (STIs) including gonorrhea and chlamydia if:  You are sexually active and are younger than 57 years of age.  You are older than 57 years of age and your health care provider tells you that you are at risk for this type of infection.  Your sexual activity has changed since you were last screened and you are at an increased risk for chlamydia or gonorrhea. Ask your health care provider if you are at risk.  If you do not have HIV, but are at risk, it may be recommended that you take a prescription medicine daily to prevent HIV infection. This is called  pre-exposure prophylaxis (PrEP). You are considered at risk if:  You are sexually active and do not regularly use condoms or know the HIV status of your partner(s).  You take drugs by injection.  You are sexually active with a partner who has HIV. Talk with your health care provider about whether you are  at high risk of being infected with HIV. If you choose to begin PrEP, you should first be tested for HIV. You should then be tested every 3 months for as long as you are taking PrEP.  PREGNANCY   If you are premenopausal and you may become pregnant, ask your health care provider about preconception counseling.  If you may become pregnant, take 400 to 800 micrograms (mcg) of folic acid every day.  If you want to prevent pregnancy, talk to your health care provider about birth control (contraception). OSTEOPOROSIS AND MENOPAUSE   Osteoporosis is a disease in which the bones lose minerals and strength with aging. This can result in serious bone fractures. Your risk for osteoporosis can be identified using a bone density scan.  If you are 88 years of age or older, or if you are at risk for osteoporosis and fractures, ask your health care provider if you should be screened.  Ask your health care provider whether you should take a calcium or vitamin D supplement to lower your risk for osteoporosis.  Menopause may have certain physical symptoms and risks.  Hormone replacement therapy may reduce some of these symptoms and risks. Talk to your health care provider about whether hormone replacement therapy is right for you.  HOME CARE INSTRUCTIONS   Schedule regular health, dental, and eye exams.  Stay current with your immunizations.   Do not use any tobacco products including cigarettes, chewing tobacco, or electronic cigarettes.  If you are pregnant, do not drink alcohol.  If you are breastfeeding, limit how much and how often you drink alcohol.  Limit alcohol intake to no more than 1  drink per day for nonpregnant women. One drink equals 12 ounces of beer, 5 ounces of wine, or 1 ounces of hard liquor.  Do not use street drugs.  Do not share needles.  Ask your health care provider for help if you need support or information about quitting drugs.  Tell your health care provider if you often feel depressed.  Tell your health care provider if you have ever been abused or do not feel safe at home. Document Released: 11/17/2010 Document Revised: 09/18/2013 Document Reviewed: 04/05/2013 [facility] Patient Information 2015 [place], [place]. This information is not intended to replace advice given to you by your health care provider. Make sure you discuss any questions you have with your health care provider. Plantar Fasciitis (Heel Spur Syndrome) with Rehab The plantar fascia is a fibrous, ligament-like, soft-tissue structure that spans the bottom of the foot. Plantar fasciitis is a condition that causes pain in the foot due to inflammation of the tissue. SYMPTOMS   Pain and tenderness on the underneath side of the foot.  Pain that worsens with standing or walking. CAUSES  Plantar fasciitis is caused by irritation and injury to the plantar fascia on the underneath side of the foot. Common mechanisms of injury include:  Direct trauma to bottom of the foot.  Damage to a small nerve that runs under the foot where the main fascia attaches to the heel bone.  Stress placed on the plantar fascia due to bone spurs. RISK INCREASES WITH:   Activities that place stress on the plantar fascia (running, jumping, pivoting, or cutting).  Poor strength and flexibility.  Improperly fitted shoes.  Tight calf muscles.  Flat feet.  Failure to warm-up properly before activity.  Obesity. PREVENTION  Warm up and stretch properly before activity.  Allow for adequate recovery between workouts.  Maintain physical fitness:  Strength, flexibility, and endurance.  Cardiovascular  fitness.  Maintain a health body weight.  Avoid stress on the plantar fascia.  Wear properly fitted shoes, including arch supports for individuals who have flat feet. PROGNOSIS  If treated properly, then the symptoms of plantar fasciitis usually resolve without surgery. However, occasionally surgery is necessary. RELATED COMPLICATIONS   Recurrent symptoms that may result in a chronic condition.  Problems of the lower back that are caused by compensating for the injury, such as limping.  Pain or weakness of the foot during push-off following surgery.  Chronic inflammation, scarring, and partial or complete fascia tear, occurring more often from repeated injections. TREATMENT  Treatment initially involves the use of ice and medication to help reduce pain and inflammation. The use of strengthening and stretching exercises may help reduce pain with activity, especially stretches of the Achilles tendon. These exercises may be performed at home or with a therapist. Your caregiver may recommend that you use heel cups of arch supports to help reduce stress on the plantar fascia. Occasionally, corticosteroid injections are given to reduce inflammation. If symptoms persist for greater than 6 months despite non-surgical (conservative), then surgery may be recommended.  MEDICATION   If pain medication is necessary, then nonsteroidal anti-inflammatory medications, such as aspirin and ibuprofen, or other minor pain relievers, such as acetaminophen, are often recommended.  Do not take pain medication within 7 days before surgery.  Prescription pain relievers may be given if deemed necessary by your caregiver. Use only as directed and only as much as you need.  Corticosteroid injections may be given by your caregiver. These injections should be reserved for the most serious cases, because they may only be given a certain number of times. HEAT AND COLD  Cold treatment (icing) relieves pain and reduces  inflammation. Cold treatment should be applied for 10 to 15 minutes every 2 to 3 hours for inflammation and pain and immediately after any activity that aggravates your symptoms. Use ice packs or massage the area with a piece of ice (ice massage).  Heat treatment may be used prior to performing the stretching and strengthening activities prescribed by your caregiver, physical therapist, or athletic trainer. Use a heat pack or soak the injury in warm water. SEEK IMMEDIATE MEDICAL CARE IF:  Treatment seems to offer no benefit, or the condition worsens.  Any medications produce adverse side effects. EXERCISES RANGE OF MOTION (ROM) AND STRETCHING EXERCISES - Plantar Fasciitis (Heel Spur Syndrome) These exercises may help you when beginning to rehabilitate your injury. Your symptoms may resolve with or without further involvement from your physician, physical therapist or athletic trainer. While completing these exercises, remember:   Restoring tissue flexibility helps normal motion to return to the joints. This allows healthier, less painful movement and activity.  An effective stretch should be held for at least 30 seconds.  A stretch should never be painful. You should only feel a gentle lengthening or release in the stretched tissue. RANGE OF MOTION - Toe Extension, Flexion  Sit with your right / left leg crossed over your opposite knee.  Grasp your toes and gently pull them back toward the top of your foot. You should feel a stretch on the bottom of your toes and/or foot.  Hold this stretch for __________ seconds.  Now, gently pull your toes toward the bottom of your foot. You should feel a stretch on the top of your toes and or foot.  Hold this stretch for __________ seconds. Repeat __________ times.  Complete this stretch __________ times per day.  RANGE OF MOTION - Ankle Dorsiflexion, Active Assisted  Remove shoes and sit on a chair that is preferably not on a carpeted  surface.  Place right / left foot under knee. Extend your opposite leg for support.  Keeping your heel down, slide your right / left foot back toward the chair until you feel a stretch at your ankle or calf. If you do not feel a stretch, slide your bottom forward to the edge of the chair, while still keeping your heel down.  Hold this stretch for __________ seconds. Repeat __________ times. Complete this stretch __________ times per day.  STRETCH - Gastroc, Standing  Place hands on wall.  Extend right / left leg, keeping the front knee somewhat bent.  Slightly point your toes inward on your back foot.  Keeping your right / left heel on the floor and your knee straight, shift your weight toward the wall, not allowing your back to arch.  You should feel a gentle stretch in the right / left calf. Hold this position for __________ seconds. Repeat __________ times. Complete this stretch __________ times per day. STRETCH - Soleus, Standing  Place hands on wall.  Extend right / left leg, keeping the other knee somewhat bent.  Slightly point your toes inward on your back foot.  Keep your right / left heel on the floor, bend your back knee, and slightly shift your weight over the back leg so that you feel a gentle stretch deep in your back calf.  Hold this position for __________ seconds. Repeat __________ times. Complete this stretch __________ times per day. STRETCH - Gastrocsoleus, Standing  Note: This exercise can place a lot of stress on your foot and ankle. Please complete this exercise only if specifically instructed by your caregiver.   Place the ball of your right / left foot on a step, keeping your other foot firmly on the same step.  Hold on to the wall or a rail for balance.  Slowly lift your other foot, allowing your body weight to press your heel down over the edge of the step.  You should feel a stretch in your right / left calf.  Hold this position for __________  seconds.  Repeat this exercise with a slight bend in your right / left knee. Repeat __________ times. Complete this stretch __________ times per day.  STRENGTHENING EXERCISES - Plantar Fasciitis (Heel Spur Syndrome)  These exercises may help you when beginning to rehabilitate your injury. They may resolve your symptoms with or without further involvement from your physician, physical therapist or athletic trainer. While completing these exercises, remember:   Muscles can gain both the endurance and the strength needed for everyday activities through controlled exercises.  Complete these exercises as instructed by your physician, physical therapist or athletic trainer. Progress the resistance and repetitions only as guided. STRENGTH - Towel Curls  Sit in a chair positioned on a non-carpeted surface.  Place your foot on a towel, keeping your heel on the floor.  Pull the towel toward your heel by only curling your toes. Keep your heel on the floor.  If instructed by your physician, physical therapist or athletic trainer, add ____________________ at the end of the towel. Repeat __________ times. Complete this exercise __________ times per day. STRENGTH - Ankle Inversion  Secure one end of a rubber exercise band/tubing to a fixed object (table, pole). Loop the other end around your foot just before your toes.  Place your fists between your knees. This will focus your strengthening at your ankle.  Slowly, pull your big toe up and in, making sure the band/tubing is positioned to resist the entire motion.  Hold this position for __________ seconds.  Have your muscles resist the band/tubing as it slowly pulls your foot back to the starting position. Repeat __________ times. Complete this exercises __________ times per day.  Document Released: 05/04/2005 Document Revised: 07/27/2011 Document Reviewed: 08/16/2008 [facility] Patient Information 2015 [place], [place]. This information is not  intended to replace advice given to you by your health care provider. Make sure you discuss any questions you have with your health care provider.

## 2015-02-08 ENCOUNTER — Other Ambulatory Visit: Payer: Self-pay | Admitting: *Deleted

## 2015-02-08 ENCOUNTER — Other Ambulatory Visit: Payer: Self-pay | Admitting: Internal Medicine

## 2015-02-08 DIAGNOSIS — E876 Hypokalemia: Secondary | ICD-10-CM

## 2015-02-08 MED ORDER — FLUTICASONE PROPIONATE 50 MCG/ACT NA SUSP: 2.0000 | Freq: Every day | NASAL | Status: DC

## 2015-02-11 ENCOUNTER — Telehealth: Payer: Self-pay | Admitting: Internal Medicine

## 2015-02-11 MED ORDER — SPIRONOLACTONE 25 MG PO TABS: 25.0000 mg | ORAL_TABLET | Freq: Every day | ORAL | Status: DC

## 2015-02-11 NOTE — Telephone Encounter (Signed)
Pt call to say that she receive a call on Friday 02/08/15 and told something was wrong with her lab and that they were going to call her in a rx to take and then fup in 4 weeks. Pt contacted the pharmacy and there was no rx there for her.    Pharmacy [facility]

## 2015-02-11 NOTE — Telephone Encounter (Signed)
Spoke to pt, told her sorry Rx sent to pharmacy just now. Pt verbalized understanding.

## 2015-03-08 ENCOUNTER — Other Ambulatory Visit (INDEPENDENT_AMBULATORY_CARE_PROVIDER_SITE_OTHER): Payer: 59

## 2015-03-08 DIAGNOSIS — E876 Hypokalemia: Secondary | ICD-10-CM | POA: Diagnosis not present

## 2015-03-08 LAB — BASIC METABOLIC PANEL
BUN: 7 mg/dL (ref 6–23)
CHLORIDE: 103 meq/L (ref 96–112)
CO2: 27 meq/L (ref 19–32)
Calcium: 8.8 mg/dL (ref 8.4–10.5)
Creatinine, Ser: 0.74 mg/dL (ref 0.40–1.20)
GFR: 103.97 mL/min (ref >60.00)
Glucose, Bld: 101 mg/dL — ABNORMAL HIGH (ref 70–99)
POTASSIUM: 3.4 meq/L — AB (ref 3.5–5.1)
Sodium: 139 mEq/L (ref 135–145)

## 2015-04-08 ENCOUNTER — Telehealth: Payer: Self-pay | Admitting: Internal Medicine

## 2015-04-08 MED ORDER — FLUTICASONE PROPIONATE 50 MCG/ACT NA SUSP: 2.0000 | Freq: Every day | NASAL | Status: DC

## 2015-04-08 NOTE — Telephone Encounter (Signed)
Pt request refill of the following:  fluticasone (FLONASE) 50 MCG/ACT nasal spray  Pt said [facility] and is asking if the rx can be sent to the below pharmacy   Phamacy: [place]

## 2015-04-08 NOTE — Telephone Encounter (Signed)
Left message on voicemail Rx sent to pharmacy as requested. 

## 2015-05-23 ENCOUNTER — Telehealth: Payer: Self-pay | Admitting: Internal Medicine

## 2015-05-23 MED ORDER — BENZONATATE 200 MG PO CAPS: 200.0000 mg | ORAL_CAPSULE | Freq: Two times a day (BID) | ORAL | Status: DC | PRN

## 2015-05-23 NOTE — Telephone Encounter (Signed)
Spoke to pt, told her Rx for Tessalon capsules were sent to pharmacy to help with cough. Pt verbalized understanding.

## 2015-05-23 NOTE — Telephone Encounter (Signed)
Pt was prescribed some "cough pills" for the daytime and pt states that she needs some more. Pt uses the [facility] on [address]. Pt asked that she be called after 9am, then she'll be off work. Please advise

## 2015-05-23 NOTE — Telephone Encounter (Signed)
Okay generic Tessalon Perles 200 mg #20 one twice a day as needed for cough

## 2015-05-23 NOTE — Telephone Encounter (Signed)
Please see message and advise 

## 2015-08-07 ENCOUNTER — Telehealth: Payer: Self-pay | Admitting: Internal Medicine

## 2015-08-07 MED ORDER — TRIAMTERENE-HCTZ 37.5-25 MG PO CAPS: 1.0000 | ORAL_CAPSULE | Freq: Every day | ORAL | Status: DC

## 2015-08-07 NOTE — Telephone Encounter (Signed)
Pt request refill of the following: triamterene-hydrochlorothiazide (DYAZIDE) 37.5-25 MG per capsule  Pt said she now uses the below pharmacy   Pt is asking if a few pills can be called in to her local pharmacy until her mail order come   [facility]     Phamacy:  [facility]

## 2015-08-07 NOTE — Telephone Encounter (Signed)
Left message on voicemail to call office. Rx sent to [facility] and Rx sent to [facility] 15 pills till mailorder comes.

## 2015-08-09 NOTE — Telephone Encounter (Signed)
Spoke to pt, told her Rx was sent to local pharmacy and sent to [facility]. Pt verbalized understanding.

## 2015-11-18 ENCOUNTER — Ambulatory Visit: Payer: 59 | Admitting: Internal Medicine

## 2015-11-22 ENCOUNTER — Ambulatory Visit: Payer: 59 | Admitting: Internal Medicine

## 2015-11-22 ENCOUNTER — Ambulatory Visit (INDEPENDENT_AMBULATORY_CARE_PROVIDER_SITE_OTHER): Payer: 59 | Admitting: Internal Medicine

## 2015-11-22 ENCOUNTER — Encounter: Payer: Self-pay | Admitting: Internal Medicine

## 2015-11-22 VITALS — BP 118/70 | HR 92 | Temp 98.4°F | Resp 20 | Ht 66.0 in | Wt 214.0 lb

## 2015-11-22 DIAGNOSIS — M25511 Pain in right shoulder: Secondary | ICD-10-CM | POA: Diagnosis not present

## 2015-11-22 DIAGNOSIS — I10 Essential (primary) hypertension: Secondary | ICD-10-CM | POA: Diagnosis not present

## 2015-11-22 DIAGNOSIS — M722 Plantar fascial fibromatosis: Secondary | ICD-10-CM

## 2015-11-22 MED ORDER — NAPROXEN 500 MG PO TABS: 500.0000 mg | ORAL_TABLET | Freq: Two times a day (BID) | ORAL | Status: DC

## 2015-11-22 NOTE — Progress Notes (Signed)
Pre visit review using our clinic review tool, if applicable. No additional management support is needed unless otherwise documented below in the visit note. 

## 2015-11-22 NOTE — Patient Instructions (Signed)

## 2015-11-22 NOTE — Progress Notes (Signed)
Subjective:    Patient ID: Morgan Wolfe, female    DOB: 1956/04/13, 60 y.o.   MRN: [id number]  HPI  60 year old patient who has essential hypertension.  For the past 2 months she has had right heel pain.  This is worse in the morning when she first gets out of bed.  Pain is also aggravated by walking after prolonged inactivity.  Denies any swelling or history of trauma She also has had some intermittent right shoulder pain.  This has been an issue in the past.  Past Medical History  Diagnosis Date  . ALLERGIC RHINITIS   . Hypertension   . Right shoulder pain      Social History   Social History  . Marital Status: Single    Spouse Name: N/A  . Number of Children: N/A  . Years of Education: N/A   Occupational History  . Not on file.   Social History Main Topics  . Smoking status: Never Smoker   . Smokeless tobacco: Never Used  . Alcohol Use: No  . Drug Use: No  . Sexual Activity: Not on file   Other Topics Concern  . Not on file   Social History Narrative    Past Surgical History  Procedure Laterality Date  . Ankle surgery  2000  . Toe surgery  1998  . Vaginal hysterectomy  1980    per Dr. Harrington Challenger for fibroids    Family History  Problem Relation Age of Onset  . Coronary artery disease      1st degress relatives <50  . Hypertension    . Stroke Brother   . Hyperlipidemia    . Colon cancer Neg Hx   . Stomach cancer Neg Hx     Allergies  Allergen Reactions  . Augmentin [Amoxicillin-Pot Clavulanate] Nausea And Vomiting    Diarrhea   . Sulfonamide Derivatives Itching    Current Outpatient Prescriptions on File Prior to Visit  Medication Sig Dispense Refill  . benzonatate (TESSALON) 200 MG capsule Take 1 capsule (200 mg total) by mouth 2 (two) times daily as needed for cough. 20 capsule 0  . fluticasone (FLONASE) 50 MCG/ACT nasal spray Place 2 sprays into both nostrils daily. 16 g 2  . HYDROcodone-homatropine (HYCODAN) 5-1.5 MG/5ML syrup Take 5 mLs by  mouth every 6 (six) hours as needed for cough. 120 mL 0  . ibuprofen (ADVIL) 200 MG tablet Take 400 mg by mouth every 6 (six) hours as needed.    Marland Kitchen KLOR-CON 10 10 MEQ tablet TAKE 1 TABLET TWICE A DAY 180 tablet 3  . meclizine (ANTIVERT) 25 MG tablet Take 1 tablet (25 mg total) by mouth 3 (three) times daily as needed for dizziness. 30 tablet 4  . PREMARIN 0.625 MG tablet TAKE 1 TABLET DAILY FOR 21 DAYS THEN DO NOT TAKE FOR 7 DAYS 90 tablet 2  . spironolactone (ALDACTONE) 25 MG tablet Take 1 tablet (25 mg total) by mouth daily. 90 tablet 0  . triamterene-hydrochlorothiazide (DYAZIDE) 37.5-25 MG capsule Take 1 each (1 capsule total) by mouth daily. 15 capsule 0   No current facility-administered medications on file prior to visit.    BP 118/70 mmHg  Pulse 92  Temp(Src) 98.4 F (36.9 C) (Oral)  Resp 20  Ht 5\' 6"  (1.676 m)  Wt 214 lb (97.07 kg)  BMI 34.56 kg/m2  SpO2 98%     Review of Systems  Constitutional: Negative for activity change, appetite change and fatigue.  Musculoskeletal:  Right shoulder pain Right heel pain       Objective:   Physical Exam  Constitutional: She appears well-developed and well-nourished. No distress.  Blood pressure 118/70  Musculoskeletal:  Full range of motion right shoulder Right foot examined. No edema Tenderness with pressure over both the medial and lateral aspect of the heel          Assessment & Plan:   , plantar fasciitis.  Patient given instructions for rehabilitation exercises.  If unimproved, will call the office and consider referral for orthopedic evaluation .  Essential hypertension, stable .  Intermittent right shoulder pain  .  Will continue naproxen 500 twice a day  medication refilled  Nyoka Cowden, MD

## 2016-02-27 ENCOUNTER — Other Ambulatory Visit: Payer: Self-pay | Admitting: Internal Medicine

## 2016-02-27 DIAGNOSIS — Z1231 Encounter for screening mammogram for malignant neoplasm of breast: Secondary | ICD-10-CM

## 2016-02-28 ENCOUNTER — Ambulatory Visit
Admission: RE | Admit: 2016-02-28 | Discharge: 2016-02-28 | Disposition: A | Discharge status: Home / Self Care | Payer: 59 | Source: Ambulatory Visit | Attending: Internal Medicine | Admitting: Internal Medicine

## 2016-02-28 DIAGNOSIS — Z1231 Encounter for screening mammogram for malignant neoplasm of breast: Secondary | ICD-10-CM

## 2016-03-25 ENCOUNTER — Other Ambulatory Visit: Payer: Self-pay | Admitting: Internal Medicine

## 2016-05-23 ENCOUNTER — Other Ambulatory Visit: Payer: Self-pay | Admitting: Internal Medicine

## 2016-07-25 ENCOUNTER — Other Ambulatory Visit: Payer: Self-pay | Admitting: Internal Medicine

## 2016-08-25 ENCOUNTER — Encounter: Payer: Self-pay | Admitting: Internal Medicine

## 2016-08-25 ENCOUNTER — Ambulatory Visit (INDEPENDENT_AMBULATORY_CARE_PROVIDER_SITE_OTHER): Payer: 59 | Admitting: Internal Medicine

## 2016-08-25 VITALS — BP 120/90 | HR 69 | Temp 97.8°F | Ht 66.0 in | Wt 210.0 lb

## 2016-08-25 DIAGNOSIS — M7711 Lateral epicondylitis, right elbow: Secondary | ICD-10-CM | POA: Diagnosis not present

## 2016-08-25 DIAGNOSIS — M79601 Pain in right arm: Secondary | ICD-10-CM

## 2016-08-25 MED ORDER — DICLOFENAC SODIUM 1.5 % TD SOLN: TRANSDERMAL | 0 refills | Status: DC

## 2016-08-25 NOTE — Progress Notes (Signed)
Chief Complaint  Patient presents with  . Arm Pain    on a scale 1-10 pain is a 10    HPI: Morgan Wolfe 61 y.o.  sda  PCP is dr Raliegh Ip  Onset a couple months ago insidious of right elbow and forearm pain with some radiation to the shoulder. She had been battling intermittent wrist pain in the past but no specific injury. She's noticed over the last month it is getting worse every day. Been taking naproxen twice a day which initially helped but now is not helping and despite the rest over the weekend when she goes back to work it is not any better. No specific injury. 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 Before the onset there was a good amount of over time however now she is working eat hours a day. 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 She notices things are worse and the day but it aches all the time. No other treatment except above. States the pain is getting worse and difficult. No under other repetitive motions unusual things using at home.  ROS: See pertinent positives and negatives per HPI.  Past Medical History:  Diagnosis Date  . ALLERGIC RHINITIS   . Hypertension   . Right shoulder pain     Family History  Problem Relation Age of Onset  . Coronary artery disease      1st degress relatives <50  . Hypertension    . Stroke Brother   . Hyperlipidemia    . Colon cancer Neg Hx   . Stomach cancer Neg Hx     Social History   Social History  . Marital status: Single    Spouse name: N/A  . Number of children: N/A  . Years of education: N/A   Social History Main Topics  . Smoking status: Never Smoker  . Smokeless tobacco: Never Used  . Alcohol use No  . Drug use: No  . Sexual activity: Not Asked   Other Topics Concern  . None   Social History Narrative  . None    Outpatient Medications Prior to Visit  Medication  Sig Dispense Refill  . fluticasone (FLONASE) 50 MCG/ACT nasal spray Place 2 sprays into both nostrils daily. 16 g 2  . ibuprofen (ADVIL) 200 MG tablet Take 400 mg by mouth every 6 (six) hours as needed.    Marland Kitchen KLOR-CON 10 10 MEQ tablet TAKE 1 TABLET TWICE A DAY 180 tablet 3  . meclizine (ANTIVERT) 25 MG tablet Take 1 tablet (25 mg total) by mouth 3 (three) times daily as needed for dizziness. 30 tablet 4  . naproxen (NAPROSYN) 500 MG tablet Take 1 tablet (500 mg total) by mouth 2 (two) times daily with a meal. 60 tablet 1  . PREMARIN 0.625 MG tablet TAKE 1 TABLET DAILY FOR 21  DAYS THEN DO NOT TAKE FOR 7 DAYS 68 tablet 2  . spironolactone (ALDACTONE) 25 MG tablet Take 1 tablet (25 mg total) by mouth daily. 90 tablet 0  . triamterene-hydrochlorothiazide (DYAZIDE) 37.5-25 MG capsule TAKE 1 CAPSULE BY MOUTH  DAILY 90 capsule 1  . benzonatate (TESSALON) 200 MG capsule Take 1 capsule (200 mg total) by mouth 2 (two) times daily as needed for cough. 20 capsule 0  . HYDROcodone-homatropine (HYCODAN) 5-1.5 MG/5ML syrup Take 5 mLs by mouth  every 6 (six) hours as needed for cough. 120 mL 0   No facility-administered medications prior to visit.      EXAM:  BP 120/90 (BP Location: Left Arm, Patient Position: Sitting, Cuff Size: Normal)   Pulse 69   Temp 97.8 F (36.6 C) (Oral)   Ht 5\' 6"  (1.676 m)   Wt 210 lb (95.3 kg)   BMI 33.89 kg/m   Body mass index is 33.89 kg/m.  GENERAL: vitals reviewed and listed above, alert, oriented, appears well hydrated and in no acute distress HEENT: atraumatic, conjunctiva  clear, no obvious abnormalities on inspection of external nose and ears MS: moves all extremities without noticeable focal  abnormalityTends to favor the right arm but can have full range of motion. She is quite tender at the right lateral epicondylar area and throughout the forearm without crepitus. Grip strength appears normal but gets some pain with gripping on the right no obvious weakness mild  biceps tenderness shoulder no acute finding. Neurologic vascular. Be intact. PSYCH: pleasant and cooperative, no obvious depression or anxiety BP Readings from Last 3 Encounters:  08/25/16 120/90  11/22/15 118/70  02/06/15 110/70    ASSESSMENT AND PLAN:  Discussed the following assessment and plan:  Right arm pain - Plan: Ambulatory referral to Sports Medicine  Epicondylitis, lateral, right - Plan: Ambulatory referral to Sports Medicine Progressive right arm pain probably overuse injury tendinitis or epicondylitis or both. She is right handed. Discussed failure of oral nsaid  but there may need to be physical modifications. Have her look  At her technique at work ice every night uses strap in the short run and topical anti-inflammatories also. Referral to sports medicine for further advice  evaluation   May need " Relative rest" her right arm so  can heal  and improve. Also discussed mechanism of overuse injury. Patient agrees with the plan. Reviewed  findings and counseling  -Patient advised to return or notify health care team  if symptoms worsen ,persist or new concerns arise.  Patient Instructions  This sounds like an overuse injury of your right elbow apparatus. Similar to tennis elbow or epicondylitis. Best treatments include ice after activity adjusting your repetitive motion Get a co-strap that we talked about that you can get at a sports store [employer] temporizing Can try topical anti-inflammatories in the short run Will be contacted about a sports medicine consult for further help.     Tennis Elbow Rehab Ask your health care provider which exercises are safe for you. Do exercises exactly as told by your health care provider and adjust them as directed. It is normal to feel mild stretching, pulling, tightness, or discomfort as you do these exercises, but you should stop right away if you feel sudden pain or your pain gets worse. Do not begin these exercises until told by  your health care provider. Stretching and range of motion exercises These exercises warm up your muscles and joints and improve the movement and flexibility of your elbow. These exercises also help to relieve pain, numbness, and tingling. Exercise A: Wrist extensor stretch  1. Extend your left / right elbow with your fingers pointing down. 2. Gently pull the palm of your left / right hand toward you until you feel a gentle stretch on the top of your forearm. 3. To increase the stretch, push your left / right hand toward the outer edge or pinkie side of your forearm. 4. Hold this position for __________ seconds. Repeat __________ times. Complete this  exercise __________ times a day. If directed by your health care provider, repeat this stretch except do it with a bent elbow this time. Exercise B: Wrist flexor stretch   1. Extend your left / right elbow and turn your palm upward. 2. Gently pull your left / right palm and fingertips back so your wrist extends and your fingers point more toward the ground. 3. You should feel a gentle stretch on the inside of your forearm. 4. Hold this position for __________ seconds. Repeat __________ times. Complete this exercise __________ times a day. If directed by your health care provider, repeat this stretch except do it with a bent elbow this time. Strengthening exercises These exercises build strength and endurance in your elbow. Endurance is the ability to use your muscles for a long time, even after they get tired. Exercise C: Wrist extensors   1. Sit with your left / right forearm palm-down and fully supported on a table or countertop. Your elbow should be resting below the height of your shoulder. 2. Let your left / right wrist extend over the edge of the surface. 3. Loosely hold a __________ weight or a piece of rubber exercise band or tubing in your left / right hand. Slowly curl your left / right hand up toward your forearm. If you are using band  or tubing, hold the band or tubing in place with your other hand to provide resistance. 4. Hold this position for __________ seconds. 5. Slowly return to the starting position. Repeat __________ times. Complete this exercise __________ times a day. Exercise D: Radial deviators   1. Stand with a __________ weight in your left / righthand. Or, sit while holding a rubber exercise band or tubing with your other arm supported on a table or countertop. Position your hand so your thumb is on top. 2. Raise your hand upward in front of you so your thumb travels toward your forearm, or pull up on the rubber tubing. 3. Hold this position for __________ seconds. 4. Slowly return to the starting position. Repeat __________ times. Complete this exercise __________ times a day. Exercise E: Eccentric wrist extensors  1. Sit with your left / right forearm palm-down and fully supported on a table or countertop. Your elbow should be resting below the height of your shoulder. 2. If told by your health care provider, hold a __________ weight in your hand. 3. Let your left / right wrist extend over the edge of the surface. 4. Use your other hand to lift up your left / right hand toward your forearm. Keep your forearm on the table. 5. Using only the muscles in your left / right hand, slowly lower your hand back down to the starting position. Repeat __________ times. Complete this exercise __________ times a day. This information is not intended to replace advice given to you by your health care provider. Make sure you discuss any questions you have with your health care provider. Document Released: 05/04/2005 Document Revised: 01/08/2016 Document Reviewed: 01/31/2015 Elsevier Interactive Patient Education  2017 [employer]  Tennis Elbow Tennis elbow (lateral epicondylitis) is inflammation of the outer tendons of your forearm close to your elbow. Your tendons attach your muscles to your bones. The outer tendons  of your forearm are used to extend your wrist, and they attach on the outside part of your elbow. Tennis elbow is often found in people who play tennis, but anyone may get the condition from repeatedly extending the wrist or turning the forearm.  What are the causes? This condition is caused by repeatedly extending your wrist and using your hands. It can result from sports or work that requires repetitive forearm movements. Tennis elbow may also be caused by an injury. What increases the risk? You have a higher risk of developing tennis elbow if you play tennis or another racquet sport. You also have a higher risk if you frequently use your hands for work. This condition is also more likely to develop in:  Musicians.  Carpenters, painters, and plumbers.  Cooks.  Cashiers.  People who work in [employer].  Architect workers.  Butchers.  People who use computers. What are the signs or symptoms? Symptoms of this condition include:  Pain and tenderness in your forearm and the outer part of your elbow. You may only feel the pain when you use your arm, or you may feel it even when you are not using your arm.  A burning feeling that runs from your elbow through your arm.  Weak grip in your hands. How is this diagnosed? This condition may be diagnosed by medical history and physical exam. You may also have other tests, including:  X-rays.  MRI. How is this treated? Your health care provider will recommend lifestyle adjustments, such as resting and icing your arm. Treatment may also include:  Medicines for inflammation. This may include shots of cortisone if your pain continues.  Physical therapy. This may include massage or exercises.  An elbow brace. Surgery may eventually be recommended if your pain does not go away with treatment. Follow these instructions at home: Activity   Rest your elbow and wrist as directed by your health care provider. Try to avoid any activities that  caused the problem until your health care provider says that you can do them again.  If a physical therapist teaches you exercises, do all of them as directed.  If you lift an object, lift it with your palm facing upward. This lowers the stress on your elbow. Lifestyle   If your tennis elbow is caused by sports, check your equipment and make sure that:  You are using it correctly.  It is the best fit for you.  If your tennis elbow is caused by work, take breaks frequently, if you are able. Talk with your manager about how to best perform tasks in a way that is safe.  If your tennis elbow is caused by computer use, talk with your manager about any changes that can be made to your work environment. General instructions   If directed, apply ice to the painful area:  Put ice in a plastic bag.  Place a towel between your skin and the bag.  Leave the ice on for 20 minutes, 2-3 times per day.  Take medicines only as directed by your health care provider.  If you were given a brace, wear it as directed by your health care provider.  Keep all follow-up visits as directed by your health care provider. This is important. Contact a health care provider if:  Your pain does not get better with treatment.  Your pain gets worse.  You have numbness or weakness in your forearm, hand, or fingers. This information is not intended to replace advice given to you by your health care provider. Make sure you discuss any questions you have with your health care provider. Document Released: 05/04/2005 Document Revised: 01/02/2016 Document Reviewed: 04/30/2014 Elsevier Interactive Patient Education  2017 [place]. Panosh M.D.

## 2016-08-25 NOTE — Patient Instructions (Signed)
This sounds like an overuse injury of your right elbow apparatus. Similar to tennis elbow or epicondylitis. Best treatments include ice after activity adjusting your repetitive motion Get a co-strap that we talked about that you can get at a sports store [employer] temporizing Can try topical anti-inflammatories in the short run Will be contacted about a sports medicine consult for further help.     Tennis Elbow Rehab Ask your health care provider which exercises are safe for you. Do exercises exactly as told by your health care provider and adjust them as directed. It is normal to feel mild stretching, pulling, tightness, or discomfort as you do these exercises, but you should stop right away if you feel sudden pain or your pain gets worse. Do not begin these exercises until told by your health care provider. Stretching and range of motion exercises These exercises warm up your muscles and joints and improve the movement and flexibility of your elbow. These exercises also help to relieve pain, numbness, and tingling. Exercise A: Wrist extensor stretch  1. Extend your left / right elbow with your fingers pointing down. 2. Gently pull the palm of your left / right hand toward you until you feel a gentle stretch on the top of your forearm. 3. To increase the stretch, push your left / right hand toward the outer edge or pinkie side of your forearm. 4. Hold this position for __________ seconds. Repeat __________ times. Complete this exercise __________ times a day. If directed by your health care provider, repeat this stretch except do it with a bent elbow this time. Exercise B: Wrist flexor stretch   1. Extend your left / right elbow and turn your palm upward. 2. Gently pull your left / right palm and fingertips back so your wrist extends and your fingers point more toward the ground. 3. You should feel a gentle stretch on the inside of your forearm. 4. Hold this position for __________  seconds. Repeat __________ times. Complete this exercise __________ times a day. If directed by your health care provider, repeat this stretch except do it with a bent elbow this time. Strengthening exercises These exercises build strength and endurance in your elbow. Endurance is the ability to use your muscles for a long time, even after they get tired. Exercise C: Wrist extensors   1. Sit with your left / right forearm palm-down and fully supported on a table or countertop. Your elbow should be resting below the height of your shoulder. 2. Let your left / right wrist extend over the edge of the surface. 3. Loosely hold a __________ weight or a piece of rubber exercise band or tubing in your left / right hand. Slowly curl your left / right hand up toward your forearm. If you are using band or tubing, hold the band or tubing in place with your other hand to provide resistance. 4. Hold this position for __________ seconds. 5. Slowly return to the starting position. Repeat __________ times. Complete this exercise __________ times a day. Exercise D: Radial deviators   1. Stand with a __________ weight in your left / righthand. Or, sit while holding a rubber exercise band or tubing with your other arm supported on a table or countertop. Position your hand so your thumb is on top. 2. Raise your hand upward in front of you so your thumb travels toward your forearm, or pull up on the rubber tubing. 3. Hold this position for __________ seconds. 4. Slowly return to the starting  position. Repeat __________ times. Complete this exercise __________ times a day. Exercise E: Eccentric wrist extensors  1. Sit with your left / right forearm palm-down and fully supported on a table or countertop. Your elbow should be resting below the height of your shoulder. 2. If told by your health care provider, hold a __________ weight in your hand. 3. Let your left / right wrist extend over the edge of the  surface. 4. Use your other hand to lift up your left / right hand toward your forearm. Keep your forearm on the table. 5. Using only the muscles in your left / right hand, slowly lower your hand back down to the starting position. Repeat __________ times. Complete this exercise __________ times a day. This information is not intended to replace advice given to you by your health care provider. Make sure you discuss any questions you have with your health care provider. Document Released: 05/04/2005 Document Revised: 01/08/2016 Document Reviewed: 01/31/2015 Elsevier Interactive Patient Education  2017 [employer]  Tennis Elbow Tennis elbow (lateral epicondylitis) is inflammation of the outer tendons of your forearm close to your elbow. Your tendons attach your muscles to your bones. The outer tendons of your forearm are used to extend your wrist, and they attach on the outside part of your elbow. Tennis elbow is often found in people who play tennis, but anyone may get the condition from repeatedly extending the wrist or turning the forearm. What are the causes? This condition is caused by repeatedly extending your wrist and using your hands. It can result from sports or work that requires repetitive forearm movements. Tennis elbow may also be caused by an injury. What increases the risk? You have a higher risk of developing tennis elbow if you play tennis or another racquet sport. You also have a higher risk if you frequently use your hands for work. This condition is also more likely to develop in:  Musicians.  Carpenters, painters, and plumbers.  Cooks.  Cashiers.  People who work in [employer].  Architect workers.  Butchers.  People who use computers. What are the signs or symptoms? Symptoms of this condition include:  Pain and tenderness in your forearm and the outer part of your elbow. You may only feel the pain when you use your arm, or you may feel it even when you are  not using your arm.  A burning feeling that runs from your elbow through your arm.  Weak grip in your hands. How is this diagnosed? This condition may be diagnosed by medical history and physical exam. You may also have other tests, including:  X-rays.  MRI. How is this treated? Your health care provider will recommend lifestyle adjustments, such as resting and icing your arm. Treatment may also include:  Medicines for inflammation. This may include shots of cortisone if your pain continues.  Physical therapy. This may include massage or exercises.  An elbow brace. Surgery may eventually be recommended if your pain does not go away with treatment. Follow these instructions at home: Activity   Rest your elbow and wrist as directed by your health care provider. Try to avoid any activities that caused the problem until your health care provider says that you can do them again.  If a physical therapist teaches you exercises, do all of them as directed.  If you lift an object, lift it with your palm facing upward. This lowers the stress on your elbow. Lifestyle   If your tennis elbow is caused  by sports, check your equipment and make sure that:  You are using it correctly.  It is the best fit for you.  If your tennis elbow is caused by work, take breaks frequently, if you are able. Talk with your manager about how to best perform tasks in a way that is safe.  If your tennis elbow is caused by computer use, talk with your manager about any changes that can be made to your work environment. General instructions   If directed, apply ice to the painful area:  Put ice in a plastic bag.  Place a towel between your skin and the bag.  Leave the ice on for 20 minutes, 2-3 times per day.  Take medicines only as directed by your health care provider.  If you were given a brace, wear it as directed by your health care provider.  Keep all follow-up visits as directed by your health  care provider. This is important. Contact a health care provider if:  Your pain does not get better with treatment.  Your pain gets worse.  You have numbness or weakness in your forearm, hand, or fingers. This information is not intended to replace advice given to you by your health care provider. Make sure you discuss any questions you have with your health care provider. Document Released: 05/04/2005 Document Revised: 01/02/2016 Document Reviewed: 04/30/2014 Elsevier Interactive Patient Education  2017 [employer].

## 2016-08-27 ENCOUNTER — Telehealth: Payer: Self-pay | Admitting: Internal Medicine

## 2016-08-27 ENCOUNTER — Telehealth: Payer: Self-pay

## 2016-08-27 NOTE — Telephone Encounter (Signed)
Is there an alternative medication that you can recommend for patient?

## 2016-08-27 NOTE — Telephone Encounter (Signed)
Pt came in and saw Dr. Regis Bill and the medication (diclofenac Sodium 1.5%) needs to have a PA so pt would like to have something else that will not need a PA b/c her arm is still in pain.  Pharm:  [facility]  [employer].

## 2016-08-27 NOTE — Telephone Encounter (Signed)
Received PA request from [facility] for Diclofenac 1.5% solution. Pa submitted & is pending. Key: [id number]

## 2016-08-27 NOTE — Telephone Encounter (Signed)
PA denied.   Medication authorization requires the following: (1) you have a diagnosis of osteoarthritis of the knee(s) AND (2) one of the following (a) you have a history of failure, contraindication, or intolerance to two oral prescription-strength non-steroidal anti-inflammatory drugs (NSAIDS) (e.g. Ibuprofen, naproxen, diclofenac sodium); OR (b) you have documented swallowing disorder; OR  (c) you have a history of peptic ulcer disease or gastrointestinal bleed OR (d) you are older than 59 years of age with one additional risk factor for gastrointestinal events.

## 2016-08-28 MED ORDER — DICLOFENAC SODIUM 1 % TD GEL: 2.0000 g | Freq: Four times a day (QID) | TRANSDERMAL | 3 refills | Status: DC

## 2016-08-28 NOTE — Telephone Encounter (Signed)
Morgan Medin, MD    8:25 AM  Note    Will they cover   The voltaren gel?    If so  Order the gell  An use 2 gram up to q6 hrs       Rx changed to Voltaren gel. Nothing further needed at this time.

## 2016-08-28 NOTE — Telephone Encounter (Signed)
Will they cover   The voltaren gel?    If so  Order the gell  An use 2 gram up to q6 hrs

## 2016-08-28 NOTE — Addendum Note (Signed)
Addended by: Beckie Busing on: 08/28/2016 09:56 AM   Modules accepted: Orders

## 2016-08-31 ENCOUNTER — Ambulatory Visit (INDEPENDENT_AMBULATORY_CARE_PROVIDER_SITE_OTHER): Payer: 59 | Admitting: Sports Medicine

## 2016-08-31 ENCOUNTER — Encounter: Payer: Self-pay | Admitting: Sports Medicine

## 2016-08-31 VITALS — BP 152/83 | Ht 66.0 in | Wt 210.0 lb

## 2016-08-31 DIAGNOSIS — M25521 Pain in right elbow: Secondary | ICD-10-CM

## 2016-08-31 DIAGNOSIS — M7711 Lateral epicondylitis, right elbow: Secondary | ICD-10-CM

## 2016-08-31 DIAGNOSIS — M7710 Lateral epicondylitis, unspecified elbow: Secondary | ICD-10-CM | POA: Insufficient documentation

## 2016-08-31 MED ORDER — PREDNISONE 10 MG PO TABS: ORAL_TABLET | ORAL | 0 refills | Status: DC

## 2016-08-31 NOTE — Assessment & Plan Note (Signed)
-   Likely due to overuse given repetitive supination required for patient's work - Prescribed 6-day steroid burst and recommended counter force brace - Will not place work restrictions at this time but may need to be considered if little improvement

## 2016-08-31 NOTE — Addendum Note (Signed)
Addended by: Cyd Silence on: 08/31/2016 02:14 PM   Modules accepted: Orders

## 2016-08-31 NOTE — Progress Notes (Signed)
[facility] Progress Note  Subjective:  Morgan Wolfe is a 61 y.o. right-handed female who presents for R forearm pain. She says this has been ongoing for 4 months but became more severe over the last week, so much so that she had to leave work due to pain last Monday. Pain is now sharp and constant. Naproxen and advil have helped, and she was hoping it would get better on its own. 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 She works Monday-Friday, and sometimes part of Saturday. She says pain may radiate up to her shoulder or down to her hands but always seem to originate from forearm. It can sometimes be hard for her to lift her R arm over her head at the beginning of the day but slowly improves throughout day. She has noted swelling of her forearm, as well. Denies injury or falls.  ROS: No fevers, no rashes, occasional tingling in her fingers of R hand but no numbness  Objective: Blood pressure (!) 152/83, height 5\' 6"  (1.676 m), weight 210 lb (95.3 kg). Body mass index is 33.89 kg/m. Constitutional: Obese female, pleasant in NAD Pulmonary/Chest: No respiratory distress.   Musculoskeletal: Arms appear symmetric. No increased heat over R forearm. Point TTP just below lateral epicondyle of R elbow. FROM at shoulder and forearm bilaterally. Pain with extension at R wrist. Decreased grip strength on R, 5-/5 compared to 5/5 on L. Negative Tinels and Phalen signs.  Neurological: Sensation intact across lower and upper arms bilaterally.  Skin: Skin is warm and dry. No rash noted. No erythema.  Psychiatric: Normal mood and affect.  Vitals reviewed  Assessment/Plan: Lateral epicondylitis (tennis elbow) - Likely due to overuse given repetitive supination required for patient's work - Prescribed 6-day steroid burst and recommended counter force brace - Will not place work restrictions at this time but may need to be considered if little  improvement  Follow-up in 2-3 weeks to assess for improvment.  Olene Floss, MD [place], PGY-2  Patient seen and evaluated with the resident. Agree with the above plan of care. We will try a 6 day Sterapred Dosepak and a counterforce brace. I did discuss a brief period out of work if her symptoms worsen. I would also consider imaging initially in the form of an ultrasound. Follow-up for ongoing or recalcitrant issues.

## 2016-10-16 ENCOUNTER — Ambulatory Visit (INDEPENDENT_AMBULATORY_CARE_PROVIDER_SITE_OTHER): Payer: 59 | Admitting: Family Medicine

## 2016-10-16 ENCOUNTER — Ambulatory Visit: Payer: Self-pay

## 2016-10-16 ENCOUNTER — Encounter: Payer: Self-pay | Admitting: Family Medicine

## 2016-10-16 VITALS — BP 140/80 | Ht 66.0 in | Wt 210.0 lb

## 2016-10-16 DIAGNOSIS — M7711 Lateral epicondylitis, right elbow: Secondary | ICD-10-CM

## 2016-10-16 MED ORDER — MELOXICAM 15 MG PO TABS: 15.0000 mg | ORAL_TABLET | Freq: Every day | ORAL | 1 refills | Status: DC

## 2016-10-16 NOTE — Assessment & Plan Note (Signed)
There was concern that she may have some symptoms of pronator syndrome but ultrasound findings suggest that she has lateral epicondylitis. - Discontinue the brace. - Sent mobic - Try home exercise therapy - If no improvement she will follow-up in [place] consider an injection versus referral to physical therapy at that time.

## 2016-10-16 NOTE — Progress Notes (Signed)
  RESHUNDA STRIDER - 61 y.o. female MRN [id number]  Date of birth: [birth year]  SUBJECTIVE:  Including CC & ROS.   Ms. Midgley is a 61 year old female is following up for right arm pain. She has been diagnosed with lateral epicondylitis. She has been wearing a brace with no improvement whatsoever. She is taking Advil which helps some. She tried prednisone and that did not help at all. He reports she does have some radicular pain going down into the dorsal aspect of her hand when she is walking and has to bend her elbow when this occurs.  ROS: No unexpected weight loss, fever, chills, swelling, instability, numbness/tingling, redness, otherwise see HPI   HISTORY: Past Medical, Surgical, Social, and Family History Reviewed & Updated per EMR.   Pertinent Historical Findings include: PMHx: Hypertension Surgical:   Hysterectomy Social:  Never smoker  DATA REVIEWED: None  PHYSICAL EXAM:  VS: BP 140/80   Ht 5\' 6"  (1.676 m)   Wt 210 lb (95.3 kg)   BMI 33.89 kg/m  PHYSICAL EXAM: Gen: NAD, alert, cooperative with exam, well-appearing HEENT: clear conjunctiva, EOMI CV:  no edema, capillary refill brisk,  Resp: non-labored, normal speech Skin: no rashes, normal turgor  Neuro: no gross deficits.  Psych:  alert and oriented MSK: Right elbow: Some tenderness to palpation over the lateral condyle. No overlying ecchymosis or swelling. More tenderness to palpation over the muscle belly of the common extensor tendon. Pain to resistance with wrist extension. Normal strength to resistance of the wrist in all directions per Normal finger abduction and abduction. Neurovascularly intact.  Limited ultrasound: Right elbow:  She has a spur at the tip of the lateral condyle. No tearing of the common extensor tendon. Doppler flow did show increased vascularity.  Summary: Findings are consistent with lateral epicondylitis  Ultrasound and interpretation by Clearance Coots, MD  ASSESSMENT & PLAN:    Lateral epicondylitis (tennis elbow) There was concern that she may have some symptoms of pronator syndrome but ultrasound findings suggest that she has lateral epicondylitis. - Discontinue the brace. - Sent mobic - Try home exercise therapy - If no improvement she will follow-up in [place] consider an injection versus referral to physical therapy at that time.

## 2016-12-20 ENCOUNTER — Other Ambulatory Visit: Payer: Self-pay | Admitting: Internal Medicine

## 2016-12-21 ENCOUNTER — Ambulatory Visit (INDEPENDENT_AMBULATORY_CARE_PROVIDER_SITE_OTHER): Payer: 59 | Admitting: Sports Medicine

## 2016-12-21 ENCOUNTER — Ambulatory Visit
Admission: RE | Admit: 2016-12-21 | Discharge: 2016-12-21 | Disposition: A | Discharge status: Home / Self Care | Payer: 59 | Source: Ambulatory Visit | Attending: Sports Medicine | Admitting: Sports Medicine

## 2016-12-21 ENCOUNTER — Encounter: Payer: Self-pay | Admitting: Sports Medicine

## 2016-12-21 VITALS — BP 136/80 | Ht 66.0 in | Wt 206.0 lb

## 2016-12-21 DIAGNOSIS — M25421 Effusion, right elbow: Secondary | ICD-10-CM | POA: Diagnosis not present

## 2016-12-21 DIAGNOSIS — M25521 Pain in right elbow: Secondary | ICD-10-CM | POA: Diagnosis not present

## 2016-12-21 DIAGNOSIS — M7711 Lateral epicondylitis, right elbow: Secondary | ICD-10-CM

## 2016-12-21 MED ORDER — GABAPENTIN 300 MG PO CAPS: 300.0000 mg | ORAL_CAPSULE | Freq: Every day | ORAL | 1 refills | Status: DC

## 2016-12-21 MED ORDER — PREDNISONE 10 MG PO TABS: ORAL_TABLET | ORAL | 0 refills | Status: DC

## 2016-12-21 NOTE — Progress Notes (Addendum)
   Subjective:    Patient ID: Morgan Wolfe, female    DOB: 11-Jun-1955, 61 y.o.   MRN: [id number]  HPI patient comes in today with persistent right elbow and forearm pain. Symptoms first started at the beginning of the year. They have progressively gotten worse. She has been diagnosed and treated for lateral epicondylitis but despite that her symptoms are worsening. She describes a generalized aching around the elbow with radiating pain and tingling into the dorsum of her forearm and hand. The tingling is most noticeable when holding the hand in a dependent position and walking. Her discomfort will improve, but not resolve, when she supports the elbow. She does endorse elbow pain with wrist related activities such as picking up heavy objects. She was previously placed on prednisone but is unsure whether or not that was helpful. She does endorse some right-sided neck pain as well. Previous ultrasound suggested lateral epicondylitis but no obvious tear was seen. Patient also describes swelling around the elbow which is intermittent.    Review of Systems    as above Objective:   Physical Exam  Well-developed, well-nourished. No acute distress. Sitting comfortably in exam room  Cervical spine: Full cervical range of motion. Negative Spurling's  Right elbow: Full painless range of motion. No effusion. She has mild diffuse soft tissue swelling and she is tender to palpation along the dorsal aspect of the elbow and proximal forearm. She also has tenderness over the common extensor tendon with reproducible pain with ECRB stressing. Negative Tinel's over the cubital tunnel. Decreased grip strength.  Neurological exam: Reflexes are trace but equal at the biceps, triceps, and brachial radialis tendons bilaterally. No atrophy. Sensation is intact to light touch grossly.      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 I'm  not sure whether this is the result of nerve compression around the elbow or originating at her neck. Her physical exam suggests that she may have a tear of the common extensor tendon. There is also the possibility of a soft tissue mass that may be compressing a peripheral nerve in this area. Therefore, I will start with getting imaging of her right elbow. We will start with x-rays but if they are unremarkable I will proceed with an MRI. I've asked the radiologist to open up the field of view to include the proximal forearm as well. I would like to repeat her 6 day Sterapred Dosepak and I'll start her on Neurontin 300 mg daily at bedtime. I will call her in one week to discuss MRI findings and see how she has responded to the prednisone. If x-ray and MRI do not show a definitive diagnosis, then I may need to consider a nerve conduction study.   Addendum: Other than some slight bony overgrowth of the coronoid, x-rays are unremarkable.

## 2016-12-27 ENCOUNTER — Ambulatory Visit
Admission: RE | Admit: 2016-12-27 | Discharge: 2016-12-27 | Disposition: A | Discharge status: Home / Self Care | Payer: 59 | Source: Ambulatory Visit | Attending: Sports Medicine | Admitting: Sports Medicine

## 2016-12-27 DIAGNOSIS — M25421 Effusion, right elbow: Secondary | ICD-10-CM

## 2016-12-27 DIAGNOSIS — M19021 Primary osteoarthritis, right elbow: Secondary | ICD-10-CM | POA: Diagnosis not present

## 2016-12-28 ENCOUNTER — Other Ambulatory Visit: Payer: Self-pay

## 2016-12-28 ENCOUNTER — Telehealth: Payer: Self-pay | Admitting: Sports Medicine

## 2016-12-28 DIAGNOSIS — M7711 Lateral epicondylitis, right elbow: Secondary | ICD-10-CM

## 2016-12-28 NOTE — Telephone Encounter (Signed)
I spoke with the patient on the phone today after reviewing the MRI of her right elbow. She has moderate tendinopathy of the common extensor tendon as well as interstitial tearing. Her symptoms have been present for several months and she has failed conservative treatment to date. Therefore, I will make a referral to Dr. Grandville Silos at [place] with further workup and treatment being per his discretion. Patient is in agreement with this plan.

## 2017-01-04 ENCOUNTER — Ambulatory Visit (INDEPENDENT_AMBULATORY_CARE_PROVIDER_SITE_OTHER): Payer: 59 | Admitting: Internal Medicine

## 2017-01-04 ENCOUNTER — Encounter: Payer: Self-pay | Admitting: Internal Medicine

## 2017-01-04 VITALS — BP 124/78 | HR 80 | Temp 98.2°F | Ht 66.0 in | Wt 206.0 lb

## 2017-01-04 DIAGNOSIS — I10 Essential (primary) hypertension: Secondary | ICD-10-CM

## 2017-01-04 DIAGNOSIS — Z0000 Encounter for general adult medical examination without abnormal findings: Secondary | ICD-10-CM | POA: Diagnosis not present

## 2017-01-04 DIAGNOSIS — J3089 Other allergic rhinitis: Secondary | ICD-10-CM | POA: Diagnosis not present

## 2017-01-04 LAB — CBC WITH DIFFERENTIAL/PLATELET
BASOS ABS: 0.1 10*3/uL (ref 0.0–0.1)
Basophils Relative: 0.6 % (ref 0.0–3.0)
EOS PCT: 1.9 % (ref 0.0–5.0)
Eosinophils Absolute: 0.2 10*3/uL (ref 0.0–0.7)
HCT: 37.2 % (ref 36.0–46.0)
HEMOGLOBIN: 12.4 g/dL (ref 12.0–15.0)
Lymphocytes Relative: 52.5 % — ABNORMAL HIGH (ref 12.0–46.0)
Lymphs Abs: 4.9 10*3/uL — ABNORMAL HIGH (ref 0.7–4.0)
MCHC: 33.2 g/dL (ref 30.0–36.0)
MCV: 85.3 fl (ref 78.0–100.0)
MONOS PCT: 4.2 % (ref 3.0–12.0)
Monocytes Absolute: 0.4 10*3/uL (ref 0.1–1.0)
Neutro Abs: 3.8 10*3/uL (ref 1.4–7.7)
Neutrophils Relative %: 40.8 % — ABNORMAL LOW (ref 43.0–77.0)
Platelets: 359 10*3/uL (ref 150.0–400.0)
RBC: 4.36 Mil/uL (ref 3.87–5.11)
RDW: 13.6 % (ref 11.5–15.5)
WBC: 9.3 10*3/uL (ref 4.0–10.5)

## 2017-01-04 LAB — COMPREHENSIVE METABOLIC PANEL
ALK PHOS: 65 U/L (ref 39–117)
ALT: 10 U/L (ref 0–35)
AST: 13 U/L (ref 0–37)
Albumin: 3.7 g/dL (ref 3.5–5.2)
BUN: 13 mg/dL (ref 6–23)
CALCIUM: 9.4 mg/dL (ref 8.4–10.5)
CO2: 28 mEq/L (ref 19–32)
Chloride: 101 mEq/L (ref 96–112)
Creatinine, Ser: 0.86 mg/dL (ref 0.40–1.20)
GFR: 86.86 mL/min (ref >60.00)
Glucose, Bld: 86 mg/dL (ref 70–99)
POTASSIUM: 3.2 meq/L — AB (ref 3.5–5.1)
Sodium: 138 mEq/L (ref 135–145)
Total Bilirubin: 0.6 mg/dL (ref 0.2–1.2)
Total Protein: 7.7 g/dL (ref 6.0–8.3)

## 2017-01-04 LAB — LIPID PANEL
CHOL/HDL RATIO: 3
CHOLESTEROL: 185 mg/dL (ref 0–200)
HDL: 61.4 mg/dL (ref >39.00)
NONHDL: 124.03
Triglycerides: 240 mg/dL — ABNORMAL HIGH (ref 0.0–149.0)
VLDL: 48 mg/dL — AB (ref 0.0–40.0)

## 2017-01-04 LAB — TSH: TSH: 1.75 u[IU]/mL (ref 0.35–4.50)

## 2017-01-04 LAB — LDL CHOLESTEROL, DIRECT: Direct LDL: 59 mg/dL

## 2017-01-04 MED ORDER — TRIAMTERENE-HCTZ 37.5-25 MG PO CAPS: 1.0000 | ORAL_CAPSULE | Freq: Every day | ORAL | 3 refills | Status: DC

## 2017-01-04 MED ORDER — FLUTICASONE PROPIONATE 50 MCG/ACT NA SUSP
2.0000 | Freq: Every day | NASAL | 2 refills | Status: DC
Start: 2017-01-04 — End: 2018-01-10

## 2017-01-04 NOTE — Progress Notes (Signed)
Subjective:    Patient ID: Morgan Wolfe, female    DOB: Dec 14, 1955, 61 y.o.   MRN: [id number]  HPI 61 year old patient who is seen today for a preventive health examination. She is doing quite well except for some elbow tendinopathy. She has essential hypertension which has been well-controlled.  She has allergic rhinitis which has been stable. No concerns or complaints.  She had a screening colonoscopy 5 years ago that  revealed a hyperplastic polyp only  Past Medical History:  Diagnosis Date  . ALLERGIC RHINITIS   . Hypertension   . Right shoulder pain      Social History   Social History  . Marital status: Single    Spouse name: N/A  . Number of children: N/A  . Years of education: N/A   Occupational History  . Not on file.   Social History Main Topics  . Smoking status: Never Smoker  . Smokeless tobacco: Never Used  . Alcohol use No  . Drug use: No  . Sexual activity: Not on file   Other Topics Concern  . Not on file   Social History Narrative  . No narrative on file    Past Surgical History:  Procedure Laterality Date  . ANKLE SURGERY  2000  . TOE SURGERY  1998  . VAGINAL HYSTERECTOMY  1980   per Dr. Harrington Challenger for fibroids    Family History  Problem Relation Age of Onset  . Coronary artery disease Unknown        1st degress relatives <50  . Hypertension Unknown   . Stroke Brother   . Hyperlipidemia Unknown   . Colon cancer Neg Hx   . Stomach cancer Neg Hx     Allergies  Allergen Reactions  . Augmentin [Amoxicillin-Pot Clavulanate] Nausea And Vomiting    Diarrhea   . Sulfonamide Derivatives Itching    Current Outpatient Prescriptions on File Prior to Visit  Medication Sig Dispense Refill  . meclizine (ANTIVERT) 25 MG tablet Take 1 tablet (25 mg total) by mouth 3 (three) times daily as needed for dizziness. 30 tablet 4  . naproxen (NAPROSYN) 500 MG tablet Take 1 tablet (500 mg total) by mouth 2 (two) times daily with a meal. 60 tablet 1  .  PREMARIN 0.625 MG tablet TAKE 1 TABLET DAILY FOR 21  DAYS THEN DO NOT TAKE FOR 7 DAYS 68 tablet 2  . ibuprofen (ADVIL) 200 MG tablet Take 400 mg by mouth every 6 (six) hours as needed.     No current facility-administered medications on file prior to visit.     BP 138/80 (BP Location: Left Arm, Patient Position: Sitting, Cuff Size: Normal)   Pulse 80   Temp 98.2 F (36.8 C) (Oral)   Ht 5\' 6"  (1.676 m)   Wt 206 lb (93.4 kg)   SpO2 97%   BMI 33.25 kg/m      Review of Systems  Constitutional: Negative.   HENT: Negative for congestion, dental problem, hearing loss, rhinorrhea, sinus pressure, sore throat and tinnitus.   Eyes: Negative for pain, discharge and visual disturbance.  Respiratory: Negative for cough and shortness of breath.   Cardiovascular: Negative for chest pain, palpitations and leg swelling.  Gastrointestinal: Negative for abdominal distention, abdominal pain, blood in stool, constipation, diarrhea, nausea and vomiting.  Genitourinary: Negative for difficulty urinating, dysuria, flank pain, frequency, hematuria, pelvic pain, urgency, vaginal bleeding, vaginal discharge and vaginal pain.  Musculoskeletal: Negative for arthralgias, gait problem and joint swelling.  Skin:  Negative for rash.  Neurological: Negative for dizziness, syncope, speech difficulty, weakness, numbness and headaches.  Hematological: Negative for adenopathy.  Psychiatric/Behavioral: Negative for agitation, behavioral problems and dysphoric mood. The patient is not nervous/anxious.        Objective:   Physical Exam  Constitutional: She is oriented to person, place, and time. She appears well-developed and well-nourished.  Weight 206 Blood pressure repeat 124 over 78  HENT:  Head: Normocephalic and atraumatic.  Right Ear: External ear normal.  Left Ear: External ear normal.  Mouth/Throat: Oropharynx is clear and moist.  Eyes: Conjunctivae and EOM are normal.  Neck: Normal range of motion.  Neck supple. No JVD present. No thyromegaly present.  Cardiovascular: Normal rate, regular rhythm, normal heart sounds and intact distal pulses.   No murmur heard. Pulmonary/Chest: Effort normal and breath sounds normal. She has no wheezes. She has no rales.  Abdominal: Soft. Bowel sounds are normal. She exhibits no distension and no mass. There is no tenderness. There is no rebound and no guarding.  Musculoskeletal: Normal range of motion. She exhibits no edema or tenderness.  Neurological: She is alert and oriented to person, place, and time. She has normal reflexes. No cranial nerve deficit. She exhibits normal muscle tone. Coordination normal.  Skin: Skin is warm and dry. No rash noted.  Psychiatric: She has a normal mood and affect. Her behavior is normal.          Assessment & Plan:   Preventive health examination Essential hypertension.  Well-controlled.  We'll continue diuretic therapy.  Low-salt diet, weight loss exercise all encouraged Menopausal syndrome.  Follow-up gynecology.  Patient is on hormone replacement therapy Elbow tendinopathy.  Follow-up orthopedics.  Patient is considering surgery  Continue home blood pressure monitoring Weight loss encouraged Follow-up 6-12 months  [id number],PETER Pilar Plate

## 2017-01-04 NOTE — Patient Instructions (Addendum)
Limit your sodium (Salt) intake    It is important that you exercise regularly, at least 20 minutes 3 to 4 times per week.  If you develop chest pain or shortness of breath seek  medical attention.  Please check your blood pressure on a regular basis.  If it is consistently greater than 150/90, please make an office appointment.  You need to lose weight.  Consider a lower calorie diet and regular exercise.   Health Maintenance for Postmenopausal Women Menopause is a normal process in which your reproductive ability comes to an end. This process happens gradually over a span of months to years, usually between the ages of 21 and 72. Menopause is complete when you have missed 12 consecutive menstrual periods. It is important to talk with your health care provider about some of the most common conditions that affect postmenopausal women, such as heart disease, cancer, and bone loss (osteoporosis). Adopting a healthy lifestyle and getting preventive care can help to promote your health and wellness. Those actions can also lower your chances of developing some of these common conditions. What should I know about menopause? During menopause, you may experience a number of symptoms, such as:  Moderate-to-severe hot flashes.  Night sweats.  Decrease in sex drive.  Mood swings.  Headaches.  Tiredness.  Irritability.  Memory problems.  Insomnia.  Choosing to treat or not to treat menopausal changes is an individual decision that you make with your health care provider. What should I know about hormone replacement therapy and supplements? Hormone therapy products are effective for treating symptoms that are associated with menopause, such as hot flashes and night sweats. Hormone replacement carries certain risks, especially as you become older. If you are thinking about using estrogen or estrogen with progestin treatments, discuss the benefits and risks with your health care provider. What  should I know about heart disease and stroke? Heart disease, heart attack, and stroke become more likely as you age. This may be due, in part, to the hormonal changes that your body experiences during menopause. These can affect how your body processes dietary fats, triglycerides, and cholesterol. Heart attack and stroke are both medical emergencies. There are many things that you can do to help prevent heart disease and stroke:  Have your blood pressure checked at least every 1-2 years. High blood pressure causes heart disease and increases the risk of stroke.  If you are 45-57 years old, ask your health care provider if you should take aspirin to prevent a heart attack or a stroke.  Do not use any tobacco products, including cigarettes, chewing tobacco, or electronic cigarettes. If you need help quitting, ask your health care provider.  It is important to eat a healthy diet and maintain a healthy weight. ? Be sure to include plenty of vegetables, fruits, low-fat dairy products, and lean protein. ? Avoid eating foods that are high in solid fats, added sugars, or salt (sodium).  Get regular exercise. This is one of the most important things that you can do for your health. ? Try to exercise for at least 150 minutes each week. The type of exercise that you do should increase your heart rate and make you sweat. This is known as moderate-intensity exercise. ? Try to do strengthening exercises at least twice each week. Do these in addition to the moderate-intensity exercise.  Know your numbers.Ask your health care provider to check your cholesterol and your blood glucose. Continue to have your blood tested as directed by  your health care provider.  What should I know about cancer screening? There are several types of cancer. Take the following steps to reduce your risk and to catch any cancer development as early as possible. Breast Cancer  Practice breast self-awareness. ? This means  understanding how your breasts normally appear and feel. ? It also means doing regular breast self-exams. Let your health care provider know about any changes, no matter how small.  If you are 13 or older, have a clinician do a breast exam (clinical breast exam or CBE) every year. Depending on your age, family history, and medical history, it may be recommended that you also have a yearly breast X-ray (mammogram).  If you have a family history of breast cancer, talk with your health care provider about genetic screening.  If you are at high risk for breast cancer, talk with your health care provider about having an MRI and a mammogram every year.  Breast cancer (BRCA) gene test is recommended for women who have family members with BRCA-related cancers. Results of the assessment will determine the need for genetic counseling and BRCA1 and for BRCA2 testing. BRCA-related cancers include these types: ? Breast. This occurs in males or females. ? Ovarian. ? Tubal. This may also be called fallopian tube cancer. ? Cancer of the abdominal or pelvic lining (peritoneal cancer). ? Prostate. ? Pancreatic.  Cervical, Uterine, and Ovarian Cancer Your health care provider may recommend that you be screened regularly for cancer of the pelvic organs. These include your ovaries, uterus, and vagina. This screening involves a pelvic exam, which includes checking for microscopic changes to the surface of your cervix (Pap test).  For women ages 21-65, health care providers may recommend a pelvic exam and a Pap test every three years. For women ages 65-65, they may recommend the Pap test and pelvic exam, combined with testing for human papilloma virus (HPV), every five years. Some types of HPV increase your risk of cervical cancer. Testing for HPV may also be done on women of any age who have unclear Pap test results.  Other health care providers may not recommend any screening for nonpregnant women who are  considered low risk for pelvic cancer and have no symptoms. Ask your health care provider if a screening pelvic exam is right for you.  If you have had past treatment for cervical cancer or a condition that could lead to cancer, you need Pap tests and screening for cancer for at least 20 years after your treatment. If Pap tests have been discontinued for you, your risk factors (such as having a new sexual partner) need to be reassessed to determine if you should start having screenings again. Some women have medical problems that increase the chance of getting cervical cancer. In these cases, your health care provider may recommend that you have screening and Pap tests more often.  If you have a family history of uterine cancer or ovarian cancer, talk with your health care provider about genetic screening.  If you have vaginal bleeding after reaching menopause, tell your health care provider.  There are currently no reliable tests available to screen for ovarian cancer.  Lung Cancer Lung cancer screening is recommended for adults 59-11 years old who are at high risk for lung cancer because of a history of smoking. A yearly low-dose CT scan of the lungs is recommended if you:  Currently smoke.  Have a history of at least 30 pack-years of smoking and you currently smoke or  have quit within the past 15 years. A pack-year is smoking an average of one pack of cigarettes per day for one year.  Yearly screening should:  Continue until it has been 15 years since you quit.  Stop if you develop a health problem that would prevent you from having lung cancer treatment.  Colorectal Cancer  This type of cancer can be detected and can often be prevented.  Routine colorectal cancer screening usually begins at age 8 and continues through age 30.  If you have risk factors for colon cancer, your health care provider may recommend that you be screened at an earlier age.  If you have a family history of  colorectal cancer, talk with your health care provider about genetic screening.  Your health care provider may also recommend using home test kits to check for hidden blood in your stool.  A small camera at the end of a tube can be used to examine your colon directly (sigmoidoscopy or colonoscopy). This is done to check for the earliest forms of colorectal cancer.  Direct examination of the colon should be repeated every 5-10 years until age 27. However, if early forms of precancerous polyps or small growths are found or if you have a family history or genetic risk for colorectal cancer, you may need to be screened more often.  Skin Cancer  Check your skin from head to toe regularly.  Monitor any moles. Be sure to tell your health care provider: ? About any new moles or changes in moles, especially if there is a change in a mole's shape or color. ? If you have a mole that is larger than the size of a pencil eraser.  If any of your family members has a history of skin cancer, especially at a young age, talk with your health care provider about genetic screening.  Always use sunscreen. Apply sunscreen liberally and repeatedly throughout the day.  Whenever you are outside, protect yourself by wearing long sleeves, pants, a wide-brimmed hat, and sunglasses.  What should I know about osteoporosis? Osteoporosis is a condition in which bone destruction happens more quickly than new bone creation. After menopause, you may be at an increased risk for osteoporosis. To help prevent osteoporosis or the bone fractures that can happen because of osteoporosis, the following is recommended:  If you are 49-38 years old, get at least 1,000 mg of calcium and at least 600 mg of vitamin D per day.  If you are older than age 37 but younger than age 64, get at least 1,200 mg of calcium and at least 600 mg of vitamin D per day.  If you are older than age 101, get at least 1,200 mg of calcium and at least 800 mg  of vitamin D per day.  Smoking and excessive alcohol intake increase the risk of osteoporosis. Eat foods that are rich in calcium and vitamin D, and do weight-bearing exercises several times each week as directed by your health care provider. What should I know about how menopause affects my mental health? Depression may occur at any age, but it is more common as you become older. Common symptoms of depression include:  Low or sad mood.  Changes in sleep patterns.  Changes in appetite or eating patterns.  Feeling an overall lack of motivation or enjoyment of activities that you previously enjoyed.  Frequent crying spells.  Talk with your health care provider if you think that you are experiencing depression. What should I know  about immunizations? It is important that you get and maintain your immunizations. These include:  Tetanus, diphtheria, and pertussis (Tdap) booster vaccine.  Influenza every year before the flu season begins.  Pneumonia vaccine.  Shingles vaccine.  Your health care provider may also recommend other immunizations. This information is not intended to replace advice given to you by your health care provider. Make sure you discuss any questions you have with your health care provider. Document Released: 06/26/2005 Document Revised: 11/22/2015 Document Reviewed: 02/05/2015 Elsevier Interactive Patient Education  2018 [employer].

## 2017-01-05 MED ORDER — POTASSIUM CHLORIDE CRYS ER 20 MEQ PO TBCR: 20.0000 meq | EXTENDED_RELEASE_TABLET | Freq: Every day | ORAL | 3 refills | Status: DC

## 2017-01-05 NOTE — Addendum Note (Signed)
Addended by: Abelardo Diesel on: 01/05/2017 03:43 PM   Modules accepted: Orders

## 2017-01-06 DIAGNOSIS — M7711 Lateral epicondylitis, right elbow: Secondary | ICD-10-CM | POA: Diagnosis not present

## 2017-01-21 ENCOUNTER — Telehealth: Payer: Self-pay | Admitting: Internal Medicine

## 2017-01-21 MED ORDER — ESTROGENS CONJUGATED 0.625 MG PO TABS: ORAL_TABLET | ORAL | 0 refills | Status: DC

## 2017-01-21 NOTE — Telephone Encounter (Signed)
Pt needs a 7 day day script to local pharmacy  PREMARIN 0.625 MG tablet  Her regular order from [place] will not be here until 9/11 and she is out.   [facility], [place]

## 2017-02-23 DIAGNOSIS — M7711 Lateral epicondylitis, right elbow: Secondary | ICD-10-CM | POA: Diagnosis not present

## 2017-03-11 ENCOUNTER — Other Ambulatory Visit: Payer: Self-pay | Admitting: Internal Medicine

## 2017-03-11 DIAGNOSIS — J3089 Other allergic rhinitis: Secondary | ICD-10-CM

## 2017-03-11 DIAGNOSIS — I10 Essential (primary) hypertension: Secondary | ICD-10-CM

## 2017-03-25 ENCOUNTER — Other Ambulatory Visit: Payer: Self-pay | Admitting: Internal Medicine

## 2017-04-02 ENCOUNTER — Telehealth: Payer: Self-pay | Admitting: Internal Medicine

## 2017-04-02 NOTE — Telephone Encounter (Signed)
See duplicate phone encounter dated for same date.

## 2017-04-02 NOTE — Telephone Encounter (Signed)
Patient is asking for refill on hydrocodone cough medication.  Not listed in her current med list /   Copied from [place] [phone]. Topic: General - Other >> Apr 02, 2017 11:12 AM Lennox Solders wrote: Pt would like another rx of hydrocodone cough med. Pt unable to come in due to recent arm surgery. Pt has cough at night

## 2017-04-02 NOTE — Telephone Encounter (Signed)
Rx request 

## 2017-04-02 NOTE — Telephone Encounter (Signed)
Copied from [place] [phone]. Topic: General - Other >> Apr 02, 2017 11:12 AM Lennox Solders wrote: Pt would like another rx of hydrocodone cough med. Pt unable to come in due to recent arm surgery. Pt has cough at night

## 2017-04-02 NOTE — Telephone Encounter (Signed)
I spoke with pt and she is aware Dr. Raliegh Ip is out of the office today. She has tried taking Delsym in the past but not working. She asked that we forward the note for review on Monday 04/05/17, which I did.

## 2017-04-05 ENCOUNTER — Other Ambulatory Visit: Payer: Self-pay | Admitting: Internal Medicine

## 2017-04-05 MED ORDER — HYDROCODONE-HOMATROPINE 5-1.5 MG/5ML PO SYRP: 5.0000 mL | ORAL_SOLUTION | Freq: Three times a day (TID) | ORAL | 0 refills | Status: DC | PRN

## 2017-04-05 NOTE — Telephone Encounter (Signed)
Prescription printed, awaiting to be signed.

## 2017-04-05 NOTE — Telephone Encounter (Signed)
Ok 6 oz

## 2017-04-05 NOTE — Telephone Encounter (Signed)
Please advise 

## 2017-04-06 NOTE — Telephone Encounter (Signed)
Pt notified Rx ready for pickup. Rx printed and signed.  

## 2017-04-16 DIAGNOSIS — M7711 Lateral epicondylitis, right elbow: Secondary | ICD-10-CM | POA: Diagnosis not present

## 2017-04-16 DIAGNOSIS — M25521 Pain in right elbow: Secondary | ICD-10-CM | POA: Diagnosis not present

## 2017-04-19 DIAGNOSIS — M25521 Pain in right elbow: Secondary | ICD-10-CM | POA: Diagnosis not present

## 2017-04-19 DIAGNOSIS — M7711 Lateral epicondylitis, right elbow: Secondary | ICD-10-CM | POA: Diagnosis not present

## 2017-04-21 DIAGNOSIS — M7711 Lateral epicondylitis, right elbow: Secondary | ICD-10-CM | POA: Diagnosis not present

## 2017-04-21 DIAGNOSIS — M25521 Pain in right elbow: Secondary | ICD-10-CM | POA: Diagnosis not present

## 2017-04-28 DIAGNOSIS — M7711 Lateral epicondylitis, right elbow: Secondary | ICD-10-CM | POA: Diagnosis not present

## 2017-04-28 DIAGNOSIS — M25521 Pain in right elbow: Secondary | ICD-10-CM | POA: Diagnosis not present

## 2017-04-30 DIAGNOSIS — M7711 Lateral epicondylitis, right elbow: Secondary | ICD-10-CM | POA: Diagnosis not present

## 2017-04-30 DIAGNOSIS — M25521 Pain in right elbow: Secondary | ICD-10-CM | POA: Diagnosis not present

## 2017-05-03 DIAGNOSIS — M25521 Pain in right elbow: Secondary | ICD-10-CM | POA: Diagnosis not present

## 2017-05-03 DIAGNOSIS — M7711 Lateral epicondylitis, right elbow: Secondary | ICD-10-CM | POA: Diagnosis not present

## 2017-05-05 DIAGNOSIS — M25521 Pain in right elbow: Secondary | ICD-10-CM | POA: Diagnosis not present

## 2017-05-05 DIAGNOSIS — M7711 Lateral epicondylitis, right elbow: Secondary | ICD-10-CM | POA: Diagnosis not present

## 2017-05-17 DIAGNOSIS — M67911 Unspecified disorder of synovium and tendon, right shoulder: Secondary | ICD-10-CM | POA: Diagnosis not present

## 2017-05-17 DIAGNOSIS — M25521 Pain in right elbow: Secondary | ICD-10-CM | POA: Diagnosis not present

## 2017-05-17 DIAGNOSIS — M7711 Lateral epicondylitis, right elbow: Secondary | ICD-10-CM | POA: Diagnosis not present

## 2017-05-18 HISTORY — PX: ELBOW ARTHROSCOPY: SUR87

## 2017-05-26 DIAGNOSIS — S161XXD Strain of muscle, fascia and tendon at neck level, subsequent encounter: Secondary | ICD-10-CM | POA: Diagnosis not present

## 2017-05-26 DIAGNOSIS — S46011D Strain of muscle(s) and tendon(s) of the rotator cuff of right shoulder, subsequent encounter: Secondary | ICD-10-CM | POA: Diagnosis not present

## 2017-05-31 DIAGNOSIS — M25521 Pain in right elbow: Secondary | ICD-10-CM | POA: Diagnosis not present

## 2017-05-31 DIAGNOSIS — M7711 Lateral epicondylitis, right elbow: Secondary | ICD-10-CM | POA: Diagnosis not present

## 2017-06-04 DIAGNOSIS — S161XXD Strain of muscle, fascia and tendon at neck level, subsequent encounter: Secondary | ICD-10-CM | POA: Diagnosis not present

## 2017-06-04 DIAGNOSIS — S46011D Strain of muscle(s) and tendon(s) of the rotator cuff of right shoulder, subsequent encounter: Secondary | ICD-10-CM | POA: Diagnosis not present

## 2017-06-08 DIAGNOSIS — M25521 Pain in right elbow: Secondary | ICD-10-CM | POA: Diagnosis not present

## 2017-06-08 DIAGNOSIS — M7711 Lateral epicondylitis, right elbow: Secondary | ICD-10-CM | POA: Diagnosis not present

## 2017-06-09 DIAGNOSIS — M67912 Unspecified disorder of synovium and tendon, left shoulder: Secondary | ICD-10-CM | POA: Diagnosis not present

## 2017-06-10 DIAGNOSIS — S161XXD Strain of muscle, fascia and tendon at neck level, subsequent encounter: Secondary | ICD-10-CM | POA: Diagnosis not present

## 2017-06-10 DIAGNOSIS — S46011D Strain of muscle(s) and tendon(s) of the rotator cuff of right shoulder, subsequent encounter: Secondary | ICD-10-CM | POA: Diagnosis not present

## 2017-06-14 DIAGNOSIS — M25521 Pain in right elbow: Secondary | ICD-10-CM | POA: Diagnosis not present

## 2017-06-14 DIAGNOSIS — M7711 Lateral epicondylitis, right elbow: Secondary | ICD-10-CM | POA: Diagnosis not present

## 2017-06-18 DIAGNOSIS — S46011D Strain of muscle(s) and tendon(s) of the rotator cuff of right shoulder, subsequent encounter: Secondary | ICD-10-CM | POA: Diagnosis not present

## 2017-06-18 DIAGNOSIS — S161XXD Strain of muscle, fascia and tendon at neck level, subsequent encounter: Secondary | ICD-10-CM | POA: Diagnosis not present

## 2017-06-23 DIAGNOSIS — S161XXD Strain of muscle, fascia and tendon at neck level, subsequent encounter: Secondary | ICD-10-CM | POA: Diagnosis not present

## 2017-06-23 DIAGNOSIS — S46011D Strain of muscle(s) and tendon(s) of the rotator cuff of right shoulder, subsequent encounter: Secondary | ICD-10-CM | POA: Diagnosis not present

## 2017-06-28 DIAGNOSIS — S161XXD Strain of muscle, fascia and tendon at neck level, subsequent encounter: Secondary | ICD-10-CM | POA: Diagnosis not present

## 2017-06-28 DIAGNOSIS — S46011D Strain of muscle(s) and tendon(s) of the rotator cuff of right shoulder, subsequent encounter: Secondary | ICD-10-CM | POA: Diagnosis not present

## 2017-07-05 DIAGNOSIS — S161XXD Strain of muscle, fascia and tendon at neck level, subsequent encounter: Secondary | ICD-10-CM | POA: Diagnosis not present

## 2017-07-05 DIAGNOSIS — S46011D Strain of muscle(s) and tendon(s) of the rotator cuff of right shoulder, subsequent encounter: Secondary | ICD-10-CM | POA: Diagnosis not present

## 2017-07-07 DIAGNOSIS — S161XXD Strain of muscle, fascia and tendon at neck level, subsequent encounter: Secondary | ICD-10-CM | POA: Diagnosis not present

## 2017-07-07 DIAGNOSIS — S46011D Strain of muscle(s) and tendon(s) of the rotator cuff of right shoulder, subsequent encounter: Secondary | ICD-10-CM | POA: Diagnosis not present

## 2017-07-21 DIAGNOSIS — M67911 Unspecified disorder of synovium and tendon, right shoulder: Secondary | ICD-10-CM | POA: Diagnosis not present

## 2017-07-21 DIAGNOSIS — M67912 Unspecified disorder of synovium and tendon, left shoulder: Secondary | ICD-10-CM | POA: Diagnosis not present

## 2017-08-18 DIAGNOSIS — M67912 Unspecified disorder of synovium and tendon, left shoulder: Secondary | ICD-10-CM | POA: Diagnosis not present

## 2017-11-15 ENCOUNTER — Other Ambulatory Visit: Payer: Self-pay | Admitting: Internal Medicine

## 2017-11-15 DIAGNOSIS — Z1231 Encounter for screening mammogram for malignant neoplasm of breast: Secondary | ICD-10-CM

## 2017-12-06 ENCOUNTER — Ambulatory Visit
Admission: RE | Admit: 2017-12-06 | Discharge: 2017-12-06 | Disposition: A | Discharge status: Home / Self Care | Payer: 59 | Source: Ambulatory Visit | Attending: Internal Medicine | Admitting: Internal Medicine

## 2017-12-06 DIAGNOSIS — Z1231 Encounter for screening mammogram for malignant neoplasm of breast: Secondary | ICD-10-CM

## 2018-01-10 ENCOUNTER — Encounter: Payer: Self-pay | Admitting: Internal Medicine

## 2018-01-10 ENCOUNTER — Ambulatory Visit (INDEPENDENT_AMBULATORY_CARE_PROVIDER_SITE_OTHER): Payer: 59 | Admitting: Internal Medicine

## 2018-01-10 VITALS — BP 100/64 | HR 72 | Temp 98.0°F | Ht 66.0 in | Wt 197.2 lb

## 2018-01-10 DIAGNOSIS — Z0000 Encounter for general adult medical examination without abnormal findings: Secondary | ICD-10-CM

## 2018-01-10 DIAGNOSIS — J3089 Other allergic rhinitis: Secondary | ICD-10-CM | POA: Diagnosis not present

## 2018-01-10 DIAGNOSIS — M25572 Pain in left ankle and joints of left foot: Secondary | ICD-10-CM | POA: Diagnosis not present

## 2018-01-10 DIAGNOSIS — I10 Essential (primary) hypertension: Secondary | ICD-10-CM | POA: Diagnosis not present

## 2018-01-10 DIAGNOSIS — G8929 Other chronic pain: Secondary | ICD-10-CM

## 2018-01-10 LAB — CBC WITH DIFFERENTIAL/PLATELET
BASOS ABS: 0.1 10*3/uL (ref 0.0–0.1)
Basophils Relative: 0.7 % (ref 0.0–3.0)
Eosinophils Absolute: 0.1 10*3/uL (ref 0.0–0.7)
Eosinophils Relative: 1.7 % (ref 0.0–5.0)
HCT: 37.2 % (ref 36.0–46.0)
Hemoglobin: 12.6 g/dL (ref 12.0–15.0)
LYMPHS ABS: 4.4 10*3/uL — AB (ref 0.7–4.0)
Lymphocytes Relative: 56.1 % — ABNORMAL HIGH (ref 12.0–46.0)
MCHC: 33.8 g/dL (ref 30.0–36.0)
MCV: 82.7 fl (ref 78.0–100.0)
MONO ABS: 0.3 10*3/uL (ref 0.1–1.0)
Monocytes Relative: 4.4 % (ref 3.0–12.0)
NEUTROS PCT: 37.1 % — AB (ref 43.0–77.0)
Neutro Abs: 2.9 10*3/uL (ref 1.4–7.7)
Platelets: 324 10*3/uL (ref 150.0–400.0)
RBC: 4.49 Mil/uL (ref 3.87–5.11)
RDW: 13.3 % (ref 11.5–15.5)
WBC: 7.8 10*3/uL (ref 4.0–10.5)

## 2018-01-10 LAB — LIPID PANEL
Cholesterol: 181 mg/dL (ref 0–200)
HDL: 60.9 mg/dL (ref >39.00)
LDL Cholesterol: 85 mg/dL (ref 0–99)
NONHDL: 119.71
Total CHOL/HDL Ratio: 3
Triglycerides: 174 mg/dL — ABNORMAL HIGH (ref 0.0–149.0)
VLDL: 34.8 mg/dL (ref 0.0–40.0)

## 2018-01-10 LAB — COMPREHENSIVE METABOLIC PANEL
ALK PHOS: 67 U/L (ref 39–117)
ALT: 11 U/L (ref 0–35)
AST: 16 U/L (ref 0–37)
Albumin: 4.1 g/dL (ref 3.5–5.2)
BILIRUBIN TOTAL: 0.6 mg/dL (ref 0.2–1.2)
BUN: 13 mg/dL (ref 6–23)
CO2: 32 mEq/L (ref 19–32)
Calcium: 9.9 mg/dL (ref 8.4–10.5)
Chloride: 100 mEq/L (ref 96–112)
Creatinine, Ser: 0.9 mg/dL (ref 0.40–1.20)
GFR: 82.13 mL/min (ref >60.00)
GLUCOSE: 85 mg/dL (ref 70–99)
Potassium: 3.4 mEq/L — ABNORMAL LOW (ref 3.5–5.1)
SODIUM: 140 meq/L (ref 135–145)
TOTAL PROTEIN: 7.6 g/dL (ref 6.0–8.3)

## 2018-01-10 LAB — TSH: TSH: 0.91 u[IU]/mL (ref 0.35–4.50)

## 2018-01-10 MED ORDER — FLUTICASONE PROPIONATE 50 MCG/ACT NA SUSP: 2.0000 | Freq: Every day | NASAL | 2 refills | Status: DC

## 2018-01-10 MED ORDER — POTASSIUM CHLORIDE CRYS ER 20 MEQ PO TBCR: 20.0000 meq | EXTENDED_RELEASE_TABLET | Freq: Every day | ORAL | 3 refills | Status: DC

## 2018-01-10 MED ORDER — TRIAMTERENE-HCTZ 37.5-25 MG PO CAPS: 1.0000 | ORAL_CAPSULE | Freq: Every day | ORAL | 5 refills | Status: DC

## 2018-01-10 NOTE — Patient Instructions (Addendum)
Health Maintenance for Postmenopausal Women Menopause is a normal process in which your reproductive ability comes to an end. This process happens gradually over a span of months to years, usually between the ages of 22 and 9. Menopause is complete when you have missed 12 consecutive menstrual periods. It is important to talk with your health care provider about some of the most common conditions that affect postmenopausal women, such as heart disease, cancer, and bone loss (osteoporosis). Adopting a healthy lifestyle and getting preventive care can help to promote your health and wellness. Those actions can also lower your chances of developing some of these common conditions. What should I know about menopause? During menopause, you may experience a number of symptoms, such as:  Moderate-to-severe hot flashes.  Night sweats.  Decrease in sex drive.  Mood swings.  Headaches.  Tiredness.  Irritability.  Memory problems.  Insomnia.  Choosing to treat or not to treat menopausal changes is an individual decision that you make with your health care provider. What should I know about hormone replacement therapy and supplements? Hormone therapy products are effective for treating symptoms that are associated with menopause, such as hot flashes and night sweats. Hormone replacement carries certain risks, especially as you become older. If you are thinking about using estrogen or estrogen with progestin treatments, discuss the benefits and risks with your health care provider. What should I know about heart disease and stroke? Heart disease, heart attack, and stroke become more likely as you age. This may be due, in part, to the hormonal changes that your body experiences during menopause. These can affect how your body processes dietary fats, triglycerides, and cholesterol. Heart attack and stroke are both medical emergencies. There are many things that you can do to help prevent heart disease  and stroke:  Have your blood pressure checked at least every 1-2 years. High blood pressure causes heart disease and increases the risk of stroke.  If you are 53-22 years old, ask your health care provider if you should take aspirin to prevent a heart attack or a stroke.  Do not use any tobacco products, including cigarettes, chewing tobacco, or electronic cigarettes. If you need help quitting, ask your health care provider.  It is important to eat a healthy diet and maintain a healthy weight. ? Be sure to include plenty of vegetables, fruits, low-fat dairy products, and lean protein. ? Avoid eating foods that are high in solid fats, added sugars, or salt (sodium).  Get regular exercise. This is one of the most important things that you can do for your health. ? Try to exercise for at least 150 minutes each week. The type of exercise that you do should increase your heart rate and make you sweat. This is known as moderate-intensity exercise. ? Try to do strengthening exercises at least twice each week. Do these in addition to the moderate-intensity exercise.  Know your numbers.Ask your health care provider to check your cholesterol and your blood glucose. Continue to have your blood tested as directed by your health care provider.  What should I know about cancer screening? There are several types of cancer. Take the following steps to reduce your risk and to catch any cancer development as early as possible. Breast Cancer  Practice breast self-awareness. ? This means understanding how your breasts normally appear and feel. ? It also means doing regular breast self-exams. Let your health care provider know about any changes, no matter how small.  If you are 40  or older, have a clinician do a breast exam (clinical breast exam or CBE) every year. Depending on your age, family history, and medical history, it may be recommended that you also have a yearly breast X-ray (mammogram).  If you  have a family history of breast cancer, talk with your health care provider about genetic screening.  If you are at high risk for breast cancer, talk with your health care provider about having an MRI and a mammogram every year.  Breast cancer (BRCA) gene test is recommended for women who have family members with BRCA-related cancers. Results of the assessment will determine the need for genetic counseling and BRCA1 and for BRCA2 testing. BRCA-related cancers include these types: ? Breast. This occurs in males or females. ? Ovarian. ? Tubal. This may also be called fallopian tube cancer. ? Cancer of the abdominal or pelvic lining (peritoneal cancer). ? Prostate. ? Pancreatic.  Cervical, Uterine, and Ovarian Cancer Your health care provider may recommend that you be screened regularly for cancer of the pelvic organs. These include your ovaries, uterus, and vagina. This screening involves a pelvic exam, which includes checking for microscopic changes to the surface of your cervix (Pap test).  For women ages 21-65, health care providers may recommend a pelvic exam and a Pap test every three years. For women ages 79-65, they may recommend the Pap test and pelvic exam, combined with testing for human papilloma virus (HPV), every five years. Some types of HPV increase your risk of cervical cancer. Testing for HPV may also be done on women of any age who have unclear Pap test results.  Other health care providers may not recommend any screening for nonpregnant women who are considered low risk for pelvic cancer and have no symptoms. Ask your health care provider if a screening pelvic exam is right for you.  If you have had past treatment for cervical cancer or a condition that could lead to cancer, you need Pap tests and screening for cancer for at least 20 years after your treatment. If Pap tests have been discontinued for you, your risk factors (such as having a new sexual partner) need to be  reassessed to determine if you should start having screenings again. Some women have medical problems that increase the chance of getting cervical cancer. In these cases, your health care provider may recommend that you have screening and Pap tests more often.  If you have a family history of uterine cancer or ovarian cancer, talk with your health care provider about genetic screening.  If you have vaginal bleeding after reaching menopause, tell your health care provider.  There are currently no reliable tests available to screen for ovarian cancer.  Lung Cancer Lung cancer screening is recommended for adults 69-62 years old who are at high risk for lung cancer because of a history of smoking. A yearly low-dose CT scan of the lungs is recommended if you:  Currently smoke.  Have a history of at least 30 pack-years of smoking and you currently smoke or have quit within the past 15 years. A pack-year is smoking an average of one pack of cigarettes per day for one year.  Yearly screening should:  Continue until it has been 15 years since you quit.  Stop if you develop a health problem that would prevent you from having lung cancer treatment.  Colorectal Cancer  This type of cancer can be detected and can often be prevented.  Routine colorectal cancer screening usually begins at  age 42 and continues through age 45.  If you have risk factors for colon cancer, your health care provider may recommend that you be screened at an earlier age.  If you have a family history of colorectal cancer, talk with your health care provider about genetic screening.  Your health care provider may also recommend using home test kits to check for hidden blood in your stool.  A small camera at the end of a tube can be used to examine your colon directly (sigmoidoscopy or colonoscopy). This is done to check for the earliest forms of colorectal cancer.  Direct examination of the colon should be repeated every  5-10 years until age 71. However, if early forms of precancerous polyps or small growths are found or if you have a family history or genetic risk for colorectal cancer, you may need to be screened more often.  Skin Cancer  Check your skin from head to toe regularly.  Monitor any moles. Be sure to tell your health care provider: ? About any new moles or changes in moles, especially if there is a change in a mole's shape or color. ? If you have a mole that is larger than the size of a pencil eraser.  If any of your family members has a history of skin cancer, especially at a young age, talk with your health care provider about genetic screening.  Always use sunscreen. Apply sunscreen liberally and repeatedly throughout the day.  Whenever you are outside, protect yourself by wearing long sleeves, pants, a wide-brimmed hat, and sunglasses.  What should I know about osteoporosis? Osteoporosis is a condition in which bone destruction happens more quickly than new bone creation. After menopause, you may be at an increased risk for osteoporosis. To help prevent osteoporosis or the bone fractures that can happen because of osteoporosis, the following is recommended:  If you are 46-71 years old, get at least 1,000 mg of calcium and at least 600 mg of vitamin D per day.  If you are older than age 55 but younger than age 65, get at least 1,200 mg of calcium and at least 600 mg of vitamin D per day.  If you are older than age 54, get at least 1,200 mg of calcium and at least 800 mg of vitamin D per day.  Smoking and excessive alcohol intake increase the risk of osteoporosis. Eat foods that are rich in calcium and vitamin D, and do weight-bearing exercises several times each week as directed by your health care provider. What should I know about how menopause affects my mental health? Depression may occur at any age, but it is more common as you become older. Common symptoms of depression  include:  Low or sad mood.  Changes in sleep patterns.  Changes in appetite or eating patterns.  Feeling an overall lack of motivation or enjoyment of activities that you previously enjoyed.  Frequent crying spells.  Talk with your health care provider if you think that you are experiencing depression. What should I know about immunizations? It is important that you get and maintain your immunizations. These include:  Tetanus, diphtheria, and pertussis (Tdap) booster vaccine.  Influenza every year before the flu season begins.  Pneumonia vaccine.  Shingles vaccine.  Your health care provider may also recommend other immunizations. This information is not intended to replace advice given to you by your health care provider. Make sure you discuss any questions you have with your health care provider. Document Released: 06/26/2005  Document Revised: 11/22/2015 Document Reviewed: 02/05/2015 Elsevier Interactive Patient Education  2018 [employer] your sodium (Salt) intake  Please check your blood pressure on a regular basis.  If it is consistently greater than 150/90, please make an office appointment.

## 2018-01-10 NOTE — Progress Notes (Signed)
Subjective:    Patient ID: Morgan Wolfe, female    DOB: 09-Aug-1955, 62 y.o.   MRN: [id number]  HPI   62 year old patient who is seen today for a annual preventive health examination. She has a history of essential hypertension which has been stable. Her only complaint today is chronic pain involving her left lateral ankle. She has had prior surgery in January 2000.  This was performed by Dr. Sharol Given  Family history.  One sister died of a brain neoplasm another brother died of an acute MI  Past Medical History:  Diagnosis Date  . ALLERGIC RHINITIS   . Hypertension   . Right shoulder pain      Social History   Socioeconomic History  . Marital status: Single    Spouse name: Not on file  . Number of children: Not on file  . Years of education: Not on file  . Highest education level: Not on file  Occupational History  . Not on file  Social Needs  . Financial resource strain: Not on file  . Food insecurity:    Worry: Not on file    Inability: Not on file  . Transportation needs:    Medical: Not on file    Non-medical: Not on file  Tobacco Use  . Smoking status: Never Smoker  . Smokeless tobacco: Never Used  Substance and Sexual Activity  . Alcohol use: No  . Drug use: No  . Sexual activity: Not on file  Lifestyle  . Physical activity:    Days per week: Not on file    Minutes per session: Not on file  . Stress: Not on file  Relationships  . Social connections:    Talks on phone: Not on file    Gets together: Not on file    Attends religious service: Not on file    Active member of club or organization: Not on file    Attends meetings of clubs or organizations: Not on file    Relationship status: Not on file  . Intimate partner violence:    Fear of current or ex partner: Not on file    Emotionally abused: Not on file    Physically abused: Not on file    Forced sexual activity: Not on file  Other Topics Concern  . Not on file  Social History Narrative  . Not  on file    Past Surgical History:  Procedure Laterality Date  . ANKLE SURGERY  2000  . TOE SURGERY  1998  . VAGINAL HYSTERECTOMY  1980   per Dr. Harrington Challenger for fibroids    Family History  Problem Relation Age of Onset  . Coronary artery disease Unknown        1st degress relatives <50  . Hypertension Unknown   . Stroke Brother   . Hyperlipidemia Unknown   . Colon cancer Neg Hx   . Stomach cancer Neg Hx     Allergies  Allergen Reactions  . Augmentin [Amoxicillin-Pot Clavulanate] Nausea And Vomiting    Diarrhea   . Sulfonamide Derivatives Itching    Current Outpatient Medications on File Prior to Visit  Medication Sig Dispense Refill  . estrogens, conjugated, (PREMARIN) 0.625 MG tablet TAKE 1 TABLET DAILY FOR 21  DAYS THEN DO NOT TAKE FOR 7 DAYS 68 tablet 3  . fluticasone (FLONASE) 50 MCG/ACT nasal spray Place 2 sprays into both nostrils daily. 16 g 2  . HYDROcodone-homatropine (HYCODAN) 5-1.5 MG/5ML syrup Take 5 mLs every 8 (eight)  hours as needed by mouth for cough. 180 mL 0  . ibuprofen (ADVIL) 200 MG tablet Take 400 mg by mouth every 6 (six) hours as needed.    . meclizine (ANTIVERT) 25 MG tablet Take 1 tablet (25 mg total) by mouth 3 (three) times daily as needed for dizziness. 30 tablet 4  . naproxen (NAPROSYN) 500 MG tablet Take 1 tablet (500 mg total) by mouth 2 (two) times daily with a meal. 60 tablet 1  . potassium chloride SA (K-DUR,KLOR-CON) 20 MEQ tablet Take 1 tablet (20 mEq total) by mouth daily. 90 tablet 3  . triamterene-hydrochlorothiazide (DYAZIDE) 37.5-25 MG capsule TAKE 1 CAPSULE BY MOUTH  DAILY 90 capsule 3   No current facility-administered medications on file prior to visit.     BP 100/64 (BP Location: Right Arm, Patient Position: Sitting, Cuff Size: Large)   Pulse 72   Temp 98 F (36.7 C) (Oral)   Ht 5\' 6"  (1.676 m)   Wt 197 lb 3.2 oz (89.4 kg)   SpO2 99%   BMI 31.83 kg/m     Review of Systems  Constitutional: Negative.   HENT: Negative for  congestion, dental problem, hearing loss, rhinorrhea, sinus pressure, sore throat and tinnitus.   Eyes: Negative for pain, discharge and visual disturbance.  Respiratory: Negative for cough and shortness of breath.   Cardiovascular: Negative for chest pain, palpitations and leg swelling.  Gastrointestinal: Negative for abdominal distention, abdominal pain, blood in stool, constipation, diarrhea, nausea and vomiting.  Genitourinary: Negative for difficulty urinating, dysuria, flank pain, frequency, hematuria, pelvic pain, urgency, vaginal bleeding, vaginal discharge and vaginal pain.  Musculoskeletal: Positive for gait problem. Negative for arthralgias and joint swelling.       Chronic pain and swelling involving the left lateral ankle of 6 months duration  Skin: Negative for rash.  Neurological: Negative for dizziness, syncope, speech difficulty, weakness, numbness and headaches.  Hematological: Negative for adenopathy.  Psychiatric/Behavioral: Negative for agitation, behavioral problems and dysphoric mood. The patient is not nervous/anxious.        Objective:   Physical Exam  Constitutional: She is oriented to person, place, and time. She appears well-developed and well-nourished.  HENT:  Head: Normocephalic.  Right Ear: External ear normal.  Left Ear: External ear normal.  Mouth/Throat: Oropharynx is clear and moist.  Eyes: Pupils are equal, round, and reactive to light. Conjunctivae and EOM are normal.  Neck: Normal range of motion. Neck supple. No thyromegaly present.  Cardiovascular: Normal rate, regular rhythm, normal heart sounds and intact distal pulses.  Pulmonary/Chest: Effort normal and breath sounds normal.  Abdominal: Soft. Bowel sounds are normal. She exhibits no mass. There is no tenderness.  Musculoskeletal: Normal range of motion.  Soft tissue swelling left lateral ankle Well-healed prior surgical scar over the left lateral malleolus  Lymphadenopathy:    She has no  cervical adenopathy.  Neurological: She is alert and oriented to person, place, and time.  Skin: Skin is warm and dry. No rash noted.  Psychiatric: She has a normal mood and affect. Her behavior is normal.          Assessment & Plan:   Preventive health examination.  Will check updated lab Essential hypertension controlled History of hypokalemia.  Will review electrolytes continue potassium supplementation Chronic left ankle pain.  We will set up follow-up with orthopedics  Low-salt diet recommended Modest weight loss encouraged Continue home blood pressure monitoring Return in 6 months for follow-up  Marletta Lor

## 2018-01-11 ENCOUNTER — Ambulatory Visit (INDEPENDENT_AMBULATORY_CARE_PROVIDER_SITE_OTHER): Payer: Self-pay

## 2018-01-11 ENCOUNTER — Ambulatory Visit (INDEPENDENT_AMBULATORY_CARE_PROVIDER_SITE_OTHER): Payer: 59 | Admitting: Orthopedic Surgery

## 2018-01-11 ENCOUNTER — Encounter (INDEPENDENT_AMBULATORY_CARE_PROVIDER_SITE_OTHER): Payer: Self-pay | Admitting: Orthopedic Surgery

## 2018-01-11 VITALS — Ht 66.0 in | Wt 197.0 lb

## 2018-01-11 DIAGNOSIS — M25572 Pain in left ankle and joints of left foot: Secondary | ICD-10-CM | POA: Diagnosis not present

## 2018-01-11 DIAGNOSIS — M25872 Other specified joint disorders, left ankle and foot: Secondary | ICD-10-CM | POA: Diagnosis not present

## 2018-01-11 LAB — HEPATITIS C ANTIBODY
HEP C AB: NONREACTIVE
SIGNAL TO CUT-OFF: 0.02 (ref <1.00)

## 2018-01-11 MED ORDER — LIDOCAINE HCL 1 % IJ SOLN
2.0000 mL | INTRAMUSCULAR | Status: AC | PRN
  Administered 2018-01-11: 2 mL

## 2018-01-11 MED ORDER — METHYLPREDNISOLONE ACETATE 40 MG/ML IJ SUSP
40.0000 mg | INTRAMUSCULAR | Status: AC | PRN
  Administered 2018-01-11: 40 mg via INTRA_ARTICULAR

## 2018-01-11 NOTE — Progress Notes (Signed)
Office Visit Note   Patient: Morgan Wolfe           Date of Birth: 12-Jan-1956           MRN: [id number] Visit Date: 01/11/2018              Requested by: Marletta Lor, MD [address] [place], [place] [address] PCP: Marletta Lor, MD  Chief Complaint  Patient presents with  . Left Ankle - Pain      HPI: Patient is a 62 year old woman who presents with a six-month history of anterior lateral left ankle pain.  She complains of pain and swelling with ambulation.  Patient is remotely status post anterior talofibular ligament reconstruction.  Assessment & Plan: Visit Diagnoses:  1. Pain in left ankle and joints of left foot   2. Impingement syndrome of left ankle     Plan: Ankle was injected she tolerated this well follow-up in 3 weeks.  Discussed that if we cannot resolve her symptoms sufficiently with injections we need to consider arthroscopic debridement for the impingement.  Follow-Up Instructions: Return in about 3 weeks (around 02/01/2018).   Ortho Exam  Patient is alert, oriented, no adenopathy, well-dressed, normal affect, normal respiratory effort. Examination patient has a good dorsalis pedis and posterior tibial pulse.  She has anterior drawer of about 2 mm which is essentially equal bilaterally.  She has no tenderness to palpation in the subtalar joint over the sinus Tarsi.  She has good ankle good subtalar motion.  She is maximally tender to palpation over the anterior lateral joint line of the ankle.  The peroneal and posterior tibial tendons are nontender to palpation.  Imaging: Xr Ankle Complete Left  Result Date: 01/11/2018 3 view radiographs of the left ankle shows a congruent mortise no osteochondral defect no bony spurs  No images are attached to the encounter.  Labs: No results found for: HGBA1C, ESRSEDRATE, CRP, LABURIC, REPTSTATUS, GRAMSTAIN, CULT, LABORGA   Lab Results  Component Value Date   ALBUMIN 4.1 01/10/2018   ALBUMIN 3.7 01/04/2017   ALBUMIN 4.0 02/06/2015    Body mass index is 31.8 kg/m.  Orders:  Orders Placed This Encounter  Procedures  . XR Ankle Complete Left   No orders of the defined types were placed in this encounter.    Procedures: Medium Joint Inj: L ankle on 01/11/2018 9:55 AM Indications: pain and diagnostic evaluation Details: 22 G 1.5 in needle, anteromedial approach Medications: 2 mL lidocaine 1 %; 40 mg methylPREDNISolone acetate 40 MG/ML Outcome: tolerated well, no immediate complications Procedure, treatment alternatives, risks and benefits explained, specific risks discussed. Consent was given by the patient. Immediately prior to procedure a time out was called to verify the correct patient, procedure, equipment, support staff and site/side marked as required. Patient was prepped and draped in the usual sterile fashion.      Clinical Data: No additional findings.  ROS:  All other systems negative, except as noted in the HPI. Review of Systems  Objective: Vital Signs: Ht 5\' 6"  (1.676 m)   Wt 197 lb (89.4 kg)   BMI 31.80 kg/m   Specialty Comments:  No specialty comments available.  PMFS History: Patient Active Problem List   Diagnosis Date Noted  . Impingement syndrome of left ankle 01/11/2018  . Lateral epicondylitis (tennis elbow) 08/31/2016  . Essential hypertension 12/06/2006  . Allergic rhinitis 12/06/2006   Past Medical History:  Diagnosis Date  . ALLERGIC RHINITIS   . Hypertension   .  Right shoulder pain     Family History  Problem Relation Age of Onset  . Coronary artery disease Unknown        1st degress relatives <50  . Hypertension Unknown   . Stroke Brother   . Hyperlipidemia Unknown   . Colon cancer Neg Hx   . Stomach cancer Neg Hx     Past Surgical History:  Procedure Laterality Date  . ANKLE SURGERY  2000  . TOE SURGERY  1998  . VAGINAL HYSTERECTOMY  1980   per Dr. Harrington Challenger for fibroids   Social History   Occupational  History  . Not on file  Tobacco Use  . Smoking status: Never Smoker  . Smokeless tobacco: Never Used  Substance and Sexual Activity  . Alcohol use: No  . Drug use: No  . Sexual activity: Not on file

## 2018-01-16 ENCOUNTER — Other Ambulatory Visit: Payer: Self-pay | Admitting: Internal Medicine

## 2018-01-16 DIAGNOSIS — I10 Essential (primary) hypertension: Secondary | ICD-10-CM

## 2018-01-16 DIAGNOSIS — J3089 Other allergic rhinitis: Secondary | ICD-10-CM

## 2018-02-01 ENCOUNTER — Ambulatory Visit (INDEPENDENT_AMBULATORY_CARE_PROVIDER_SITE_OTHER): Payer: 59 | Admitting: Orthopedic Surgery

## 2018-02-01 ENCOUNTER — Other Ambulatory Visit: Payer: Self-pay | Admitting: Internal Medicine

## 2018-02-01 DIAGNOSIS — I10 Essential (primary) hypertension: Secondary | ICD-10-CM

## 2018-02-01 DIAGNOSIS — J3089 Other allergic rhinitis: Secondary | ICD-10-CM

## 2018-02-07 ENCOUNTER — Ambulatory Visit (INDEPENDENT_AMBULATORY_CARE_PROVIDER_SITE_OTHER): Payer: 59 | Admitting: Orthopedic Surgery

## 2018-03-14 ENCOUNTER — Ambulatory Visit (INDEPENDENT_AMBULATORY_CARE_PROVIDER_SITE_OTHER): Payer: 59 | Admitting: Orthopedic Surgery

## 2018-03-14 ENCOUNTER — Encounter (INDEPENDENT_AMBULATORY_CARE_PROVIDER_SITE_OTHER): Payer: Self-pay | Admitting: Orthopedic Surgery

## 2018-03-14 VITALS — Ht 66.0 in | Wt 197.0 lb

## 2018-03-14 DIAGNOSIS — M25872 Other specified joint disorders, left ankle and foot: Secondary | ICD-10-CM | POA: Diagnosis not present

## 2018-03-14 NOTE — Progress Notes (Signed)
Office Visit Note   Patient: Morgan Wolfe           Date of Birth: [birth year]           MRN: [id number] Visit Date: 03/14/2018              Requested by: Marletta Lor, MD [address] [place], [place] [address] PCP: Marletta Lor, MD  Chief Complaint  Patient presents with  . Left Ankle - Follow-up      HPI: Patient is a 62 year old woman presents with persistent impingement symptoms of her left ankle she states that sometimes the pain is a 10/10 pain with weightbearing limps after standing and walking denies any injuries.  She states that just started hurting again she states she did have some interval relief with the steroid injections.  Patient states she does have cataracts and is going to undergo cataract surgery.  Assessment & Plan: Visit Diagnoses:  1. Impingement syndrome of left ankle     Plan: Recommend that she follow-up with her cataract surgery and then call us when she is ready for ankle arthroscopy on the left for debridement of the impingement.  Discussed that we could proceed with surgery at the orthopedic surgical center outpatient surgery on a Tuesday with return to work on Monday.  Follow-Up Instructions: Return if symptoms worsen or fail to improve.   Ortho Exam  Patient is alert, oriented, no adenopathy, well-dressed, normal affect, normal respiratory effort. Examination patient has good pulses she has good ankle and subtalar motion the sinus Tarsi is nontender to palpation subtalar motion is nontender.  She is point tender to palpation over the medial and lateral gutters of the ankle as well as anteriorly over the ankle.  Her ankle is stable without instability.  There is no redness no cellulitis no signs of infection or gout.  Imaging: No results found. No images are attached to the encounter.  Labs: No results found for: HGBA1C, ESRSEDRATE, CRP, LABURIC, REPTSTATUS, GRAMSTAIN, CULT, LABORGA   Lab Results  Component Value  Date   ALBUMIN 4.1 01/10/2018   ALBUMIN 3.7 01/04/2017   ALBUMIN 4.0 02/06/2015    Body mass index is 31.8 kg/m.  Orders:  No orders of the defined types were placed in this encounter.  No orders of the defined types were placed in this encounter.    Procedures: No procedures performed  Clinical Data: No additional findings.  ROS:  All other systems negative, except as noted in the HPI. Review of Systems  Objective: Vital Signs: Ht 5\' 6"  (1.676 m)   Wt 197 lb (89.4 kg)   BMI 31.80 kg/m   Specialty Comments:  No specialty comments available.  PMFS History: Patient Active Problem List   Diagnosis Date Noted  . Impingement syndrome of left ankle 01/11/2018  . Lateral epicondylitis (tennis elbow) 08/31/2016  . Essential hypertension 12/06/2006  . Allergic rhinitis 12/06/2006   Past Medical History:  Diagnosis Date  . ALLERGIC RHINITIS   . Hypertension   . Right shoulder pain     Family History  Problem Relation Age of Onset  . Coronary artery disease Unknown        1st degress relatives <50  . Hypertension Unknown   . Stroke Brother   . Hyperlipidemia Unknown   . Colon cancer Neg Hx   . Stomach cancer Neg Hx     Past Surgical History:  Procedure Laterality Date  . ANKLE SURGERY  2000  . TOE  SURGERY  1998  . VAGINAL HYSTERECTOMY  1980   per Dr. Harrington Challenger for fibroids   Social History   Occupational History  . Not on file  Tobacco Use  . Smoking status: Never Smoker  . Smokeless tobacco: Never Used  Substance and Sexual Activity  . Alcohol use: No  . Drug use: No  . Sexual activity: Not on file

## 2018-04-25 ENCOUNTER — Other Ambulatory Visit: Payer: Self-pay | Admitting: Internal Medicine

## 2018-04-28 ENCOUNTER — Other Ambulatory Visit: Payer: Self-pay | Admitting: *Deleted

## 2018-04-28 MED ORDER — ESTROGENS CONJUGATED 0.625 MG PO TABS: ORAL_TABLET | ORAL | 1 refills | Status: DC

## 2018-04-29 DIAGNOSIS — M542 Cervicalgia: Secondary | ICD-10-CM | POA: Diagnosis not present

## 2018-05-10 DIAGNOSIS — S46812D Strain of other muscles, fascia and tendons at shoulder and upper arm level, left arm, subsequent encounter: Secondary | ICD-10-CM | POA: Diagnosis not present

## 2018-05-10 DIAGNOSIS — M6281 Muscle weakness (generalized): Secondary | ICD-10-CM | POA: Diagnosis not present

## 2018-05-10 DIAGNOSIS — S46811D Strain of other muscles, fascia and tendons at shoulder and upper arm level, right arm, subsequent encounter: Secondary | ICD-10-CM | POA: Diagnosis not present

## 2018-05-23 DIAGNOSIS — S46812D Strain of other muscles, fascia and tendons at shoulder and upper arm level, left arm, subsequent encounter: Secondary | ICD-10-CM | POA: Diagnosis not present

## 2018-05-23 DIAGNOSIS — M6281 Muscle weakness (generalized): Secondary | ICD-10-CM | POA: Diagnosis not present

## 2018-05-23 DIAGNOSIS — S46811D Strain of other muscles, fascia and tendons at shoulder and upper arm level, right arm, subsequent encounter: Secondary | ICD-10-CM | POA: Diagnosis not present

## 2018-05-25 DIAGNOSIS — S46811D Strain of other muscles, fascia and tendons at shoulder and upper arm level, right arm, subsequent encounter: Secondary | ICD-10-CM | POA: Diagnosis not present

## 2018-05-25 DIAGNOSIS — M6281 Muscle weakness (generalized): Secondary | ICD-10-CM | POA: Diagnosis not present

## 2018-05-25 DIAGNOSIS — S46812D Strain of other muscles, fascia and tendons at shoulder and upper arm level, left arm, subsequent encounter: Secondary | ICD-10-CM | POA: Diagnosis not present

## 2018-05-30 DIAGNOSIS — H2513 Age-related nuclear cataract, bilateral: Secondary | ICD-10-CM | POA: Diagnosis not present

## 2018-05-30 DIAGNOSIS — H25013 Cortical age-related cataract, bilateral: Secondary | ICD-10-CM | POA: Diagnosis not present

## 2018-06-01 DIAGNOSIS — S46812D Strain of other muscles, fascia and tendons at shoulder and upper arm level, left arm, subsequent encounter: Secondary | ICD-10-CM | POA: Diagnosis not present

## 2018-06-01 DIAGNOSIS — M6281 Muscle weakness (generalized): Secondary | ICD-10-CM | POA: Diagnosis not present

## 2018-06-01 DIAGNOSIS — S46811D Strain of other muscles, fascia and tendons at shoulder and upper arm level, right arm, subsequent encounter: Secondary | ICD-10-CM | POA: Diagnosis not present

## 2018-06-06 DIAGNOSIS — S46812D Strain of other muscles, fascia and tendons at shoulder and upper arm level, left arm, subsequent encounter: Secondary | ICD-10-CM | POA: Diagnosis not present

## 2018-06-06 DIAGNOSIS — S46811D Strain of other muscles, fascia and tendons at shoulder and upper arm level, right arm, subsequent encounter: Secondary | ICD-10-CM | POA: Diagnosis not present

## 2018-06-06 DIAGNOSIS — M6281 Muscle weakness (generalized): Secondary | ICD-10-CM | POA: Diagnosis not present

## 2018-06-08 DIAGNOSIS — S46812D Strain of other muscles, fascia and tendons at shoulder and upper arm level, left arm, subsequent encounter: Secondary | ICD-10-CM | POA: Diagnosis not present

## 2018-06-08 DIAGNOSIS — S46811D Strain of other muscles, fascia and tendons at shoulder and upper arm level, right arm, subsequent encounter: Secondary | ICD-10-CM | POA: Diagnosis not present

## 2018-06-08 DIAGNOSIS — M6281 Muscle weakness (generalized): Secondary | ICD-10-CM | POA: Diagnosis not present

## 2018-06-13 DIAGNOSIS — M6281 Muscle weakness (generalized): Secondary | ICD-10-CM | POA: Diagnosis not present

## 2018-06-13 DIAGNOSIS — S46811D Strain of other muscles, fascia and tendons at shoulder and upper arm level, right arm, subsequent encounter: Secondary | ICD-10-CM | POA: Diagnosis not present

## 2018-06-13 DIAGNOSIS — S46812D Strain of other muscles, fascia and tendons at shoulder and upper arm level, left arm, subsequent encounter: Secondary | ICD-10-CM | POA: Diagnosis not present

## 2018-06-15 DIAGNOSIS — S46811D Strain of other muscles, fascia and tendons at shoulder and upper arm level, right arm, subsequent encounter: Secondary | ICD-10-CM | POA: Diagnosis not present

## 2018-06-15 DIAGNOSIS — M6281 Muscle weakness (generalized): Secondary | ICD-10-CM | POA: Diagnosis not present

## 2018-06-15 DIAGNOSIS — S46812D Strain of other muscles, fascia and tendons at shoulder and upper arm level, left arm, subsequent encounter: Secondary | ICD-10-CM | POA: Diagnosis not present

## 2018-06-20 DIAGNOSIS — S46811D Strain of other muscles, fascia and tendons at shoulder and upper arm level, right arm, subsequent encounter: Secondary | ICD-10-CM | POA: Diagnosis not present

## 2018-06-20 DIAGNOSIS — S46812D Strain of other muscles, fascia and tendons at shoulder and upper arm level, left arm, subsequent encounter: Secondary | ICD-10-CM | POA: Diagnosis not present

## 2018-06-20 DIAGNOSIS — M6281 Muscle weakness (generalized): Secondary | ICD-10-CM | POA: Diagnosis not present

## 2018-06-22 DIAGNOSIS — H2512 Age-related nuclear cataract, left eye: Secondary | ICD-10-CM | POA: Diagnosis not present

## 2018-06-22 DIAGNOSIS — H25012 Cortical age-related cataract, left eye: Secondary | ICD-10-CM | POA: Diagnosis not present

## 2018-06-22 DIAGNOSIS — H25011 Cortical age-related cataract, right eye: Secondary | ICD-10-CM | POA: Diagnosis not present

## 2018-06-22 DIAGNOSIS — H2511 Age-related nuclear cataract, right eye: Secondary | ICD-10-CM | POA: Diagnosis not present

## 2018-06-27 DIAGNOSIS — S46812D Strain of other muscles, fascia and tendons at shoulder and upper arm level, left arm, subsequent encounter: Secondary | ICD-10-CM | POA: Diagnosis not present

## 2018-06-27 DIAGNOSIS — M6281 Muscle weakness (generalized): Secondary | ICD-10-CM | POA: Diagnosis not present

## 2018-06-27 DIAGNOSIS — S46811D Strain of other muscles, fascia and tendons at shoulder and upper arm level, right arm, subsequent encounter: Secondary | ICD-10-CM | POA: Diagnosis not present

## 2018-06-29 DIAGNOSIS — H2512 Age-related nuclear cataract, left eye: Secondary | ICD-10-CM | POA: Diagnosis not present

## 2018-06-29 DIAGNOSIS — H25012 Cortical age-related cataract, left eye: Secondary | ICD-10-CM | POA: Diagnosis not present

## 2018-07-01 DIAGNOSIS — M542 Cervicalgia: Secondary | ICD-10-CM | POA: Diagnosis not present

## 2018-07-13 ENCOUNTER — Encounter: Payer: 59 | Admitting: Family Medicine

## 2018-07-20 ENCOUNTER — Encounter: Payer: Self-pay | Admitting: Family Medicine

## 2018-07-20 ENCOUNTER — Ambulatory Visit (INDEPENDENT_AMBULATORY_CARE_PROVIDER_SITE_OTHER): Payer: 59 | Admitting: Family Medicine

## 2018-07-20 VITALS — BP 118/60 | HR 90 | Temp 98.0°F | Wt 209.7 lb

## 2018-07-20 DIAGNOSIS — M533 Sacrococcygeal disorders, not elsewhere classified: Secondary | ICD-10-CM | POA: Diagnosis not present

## 2018-07-20 MED ORDER — PREDNISONE 20 MG PO TABS: ORAL_TABLET | ORAL | 0 refills | Status: DC

## 2018-07-20 MED ORDER — METAXALONE 800 MG PO TABS: 800.0000 mg | ORAL_TABLET | Freq: Two times a day (BID) | ORAL | 0 refills | Status: DC | PRN

## 2018-07-20 NOTE — Patient Instructions (Addendum)
Please let me know if pain is not significantly improved by Friday.    Consider thermacare wraps for heat source of back during day.   Figure 4 (leg cross) stretches and "great toe yoga stretch" - lying down leg stretch with belt  Low Back Strain Rehab Ask your health care provider which exercises are safe for you. Do exercises exactly as told by your health care provider and adjust them as directed. It is normal to feel mild stretching, pulling, tightness, or discomfort as you do these exercises, but you should stop right away if you feel sudden pain or your pain gets worse. Do not begin these exercises until told by your health care provider. Stretching and range of motion exercises These exercises warm up your muscles and joints and improve the movement and flexibility of your back. These exercises also help to relieve pain, numbness, and tingling. Exercise A: Single knee to chest  1. Lie on your back on a firm surface with both legs straight. 2. Bend one of your knees. Use your hands to move your knee up toward your chest until you feel a gentle stretch in your lower back and buttock. ? Hold your leg in this position by holding onto the front of your knee. ? Keep your other leg as straight as possible. 3. Hold for __________ seconds. 4. Slowly return to the starting position. 5. Repeat with your other leg. Repeat __________ times. Complete this exercise __________ times a day. Exercise B: Prone extension on elbows  1. Lie on your abdomen on a firm surface. 2. Prop yourself up on your elbows. 3. Use your arms to help lift your chest up until you feel a gentle stretch in your abdomen and your lower back. ? This will place some of your body weight on your elbows. If this is uncomfortable, try stacking pillows under your chest. ? Your hips should stay down, against the surface that you are lying on. Keep your hip and back muscles relaxed. 4. Hold for __________ seconds. 5. Slowly  relax your upper body and return to the starting position. Repeat __________ times. Complete this exercise __________ times a day. (Strengthening exercises) These exercises build strength and endurance in your back. Endurance is the ability to use your muscles for a long time, even after they get tired. Exercise C: Pelvic tilt 1. Lie on your back on a firm surface. Bend your knees and keep your feet flat. 2. Tense your abdominal muscles. Tip your pelvis up toward the ceiling and flatten your lower back into the floor. ? To help with this exercise, you may place a small towel under your lower back and try to push your back into the towel. 3. Hold for __________ seconds. 4. Let your muscles relax completely before you repeat this exercise. Repeat __________ times. Complete this exercise __________ times a day. Exercise D: Alternating arm and leg raises  1. Get on your hands and knees on a firm surface. If you are on a hard floor, you may want to use padding to cushion your knees, such as an exercise mat. 2. Line up your arms and legs. Your hands should be below your shoulders, and your knees should be below your hips. 3. Lift your left leg behind you. At the same time, raise your right arm and straighten it in front of you. ? Do not lift your leg higher than your hip. ? Do not lift your arm higher than your shoulder. ? Keep your abdominal and  back muscles tight. ? Keep your hips facing the ground. ? Do not arch your back. ? Keep your balance carefully, and do not hold your breath. 4. Hold for __________ seconds. 5. Slowly return to the starting position and repeat with your right leg and your left arm. Repeat __________ times. Complete this exercise __________times a day. Exercise J: Single leg lower with bent knees 1. Lie on your back on a firm surface. 2. Tense your abdominal muscles and lift your feet off the floor, one foot at a time, so your knees and hips are bent in an "L" shape (at  about 90 degrees). ? Your knees should be over your hips and your lower legs should be parallel to the floor. 3. Keeping your abdominal muscles tense and your knee bent, slowly lower one of your legs so your toe touches the ground. 4. Lift your leg back up to return to the starting position. ? Do not hold your breath. ? Do not let your back arch. Keep your back flat against the ground. 5. Repeat with your other leg. Repeat __________ times. Complete this exercise __________ times a day. Posture and body mechanics  Body mechanics refers to the movements and positions of your body while you do your daily activities. Posture is part of body mechanics. Good posture and healthy body mechanics can help to relieve stress in your body's tissues and joints. Good posture means that your spine is in its natural S-curve position (your spine is neutral), your shoulders are pulled back slightly, and your head is not tipped forward. The following are general guidelines for applying improved posture and body mechanics to your everyday activities. Standing   When standing, keep your spine neutral and your feet about hip-width apart. Keep a slight bend in your knees. Your ears, shoulders, and hips should line up.  When you do a task in which you stand in one place for a long time, place one foot up on a stable object that is 2-4 inches (5-10 cm) high, such as a footstool. This helps keep your spine neutral. Sitting   When sitting, keep your spine neutral and keep your feet flat on the floor. Use a footrest, if necessary, and keep your thighs parallel to the floor. Avoid rounding your shoulders, and avoid tilting your head forward.  When working at a desk or a computer, keep your desk at a height where your hands are slightly lower than your elbows. Slide your chair under your desk so you are close enough to maintain good posture.  When working at a computer, place your monitor at a height where you are looking  straight ahead and you do not have to tilt your head forward or downward to look at the screen. Resting   When lying down and resting, avoid positions that are most painful for you.  If you have pain with activities such as sitting, bending, stooping, or squatting (flexion-based activities), lie in a position in which your body does not bend very much. For example, avoid curling up on your side with your arms and knees near your chest (fetal position).  If you have pain with activities such as standing for a long time or reaching with your arms (extension-based activities), lie with your spine in a neutral position and bend your knees slightly. Try the following positions: ? Lying on your side with a pillow between your knees. ? Lying on your back with a pillow under your knees. Lifting   When  lifting objects, keep your feet at least shoulder-width apart and tighten your abdominal muscles.  Bend your knees and hips and keep your spine neutral. It is important to lift using the strength of your legs, not your back. Do not lock your knees straight out.  Always ask for help to lift heavy or awkward objects. This information is not intended to replace advice given to you by your health care provider. Make sure you discuss any questions you have with your health care provider. Document Released: 05/04/2005 Document Revised: 01/09/2016 Document Reviewed: 02/13/2015 Elsevier Interactive Patient Education  2019 [employer].

## 2018-07-20 NOTE — Progress Notes (Signed)
Morgan Wolfe DOB: 01-Aug-1955 Encounter date: 07/20/2018  This is a 63 y.o. female who presents with Chief Complaint  Patient presents with  . Back Pain    whole back and neck hurt last friday, pain has moved to just the right lowerback,  painful to move, pain described as aching, 8/10 pain scale    History of present illness:  Back started hurting last Friday. Thought it was way she slept. Neck hurt too. Neck improved but back didn't. Had muscle relaxer and anti-inflammatory (advil)she was taking. Can't think of anything she did that set this off.   Hurting more when she gets up from chair, bed, but constantly hurting her.   Hasn't had something like this in past.   No change in urine, blood in urine, problems with BM.   No fevers.   Advil does calm things down, but it is still there.     Naproxen doesn't help her.  Muscle relaxer was flexeril 5mg .   Allergies  Allergen Reactions  . Augmentin [Amoxicillin-Pot Clavulanate] Nausea And Vomiting    Diarrhea   . Sulfonamide Derivatives Itching   Current Meds  Medication Sig  . estrogens, conjugated, (PREMARIN) 0.625 MG tablet TAKE 1 TABLET DAILY FOR 21  DAYS THEN DO NOT TAKE FOR 7 DAYS  . fluticasone (FLONASE) 50 MCG/ACT nasal spray Place 2 sprays into both nostrils daily.  Marland Kitchen ibuprofen (ADVIL) 200 MG tablet Take 400 mg by mouth every 6 (six) hours as needed.  . meclizine (ANTIVERT) 25 MG tablet Take 1 tablet (25 mg total) by mouth 3 (three) times daily as needed for dizziness.  . potassium chloride SA (K-DUR,KLOR-CON) 20 MEQ tablet Take 1 tablet (20 mEq total) by mouth daily.  Marland Kitchen triamterene-hydrochlorothiazide (DYAZIDE) 37.5-25 MG capsule Take 1 each (1 capsule total) by mouth daily.  . [DISCONTINUED] naproxen (NAPROSYN) 500 MG tablet Take 1 tablet (500 mg total) by mouth 2 (two) times daily with a meal.    Review of Systems  Constitutional: Negative for chills, fatigue and fever.  Respiratory: Negative for cough, chest  tightness, shortness of breath and wheezing.   Cardiovascular: Negative for chest pain, palpitations and leg swelling.  Genitourinary: Negative for difficulty urinating.  Musculoskeletal: Positive for back pain. Negative for myalgias, neck pain and neck stiffness.  Neurological: Negative for weakness and numbness.    Objective:  BP 118/60 (BP Location: Left Arm, Patient Position: Sitting, Cuff Size: Normal)   Pulse 90   Temp 98 F (36.7 C) (Oral)   Wt 209 lb 11.2 oz (95.1 kg)   SpO2 96%   BMI 33.85 kg/m   Weight: 209 lb 11.2 oz (95.1 kg)   BP Readings from Last 3 Encounters:  07/20/18 118/60  01/10/18 100/64  01/04/17 124/78   Wt Readings from Last 3 Encounters:  07/20/18 209 lb 11.2 oz (95.1 kg)  03/14/18 197 lb (89.4 kg)  01/11/18 197 lb (89.4 kg)    Physical Exam Constitutional:      General: She is not in acute distress.    Appearance: She is well-developed.  Cardiovascular:     Rate and Rhythm: Normal rate and regular rhythm.     Heart sounds: Normal heart sounds. No murmur. No friction rub.  Pulmonary:     Effort: Pulmonary effort is normal. No respiratory distress.     Breath sounds: Normal breath sounds. No wheezing or rales.  Musculoskeletal:     Right lower leg: No edema.     Left lower leg: No  edema.     Comments: No spinal tenderness to palpation.  She does have tenderness right SI area.  No significant pain with extension.  She does have pain with flexion beyond 15 degrees.  Not significantly tender with twisting or with tilting at the waist.  There is right trochanteric bursa tenderness and right IT band tenderness to palpation.  She has discomfort of the SI joint with external rotation of the hip.  Range of motion of the hip is within normal limits.  She not have significant pain with internal rotation of the hip.  There is some muscle spasm appreciated around the right sacroiliac area.  Neurological:     Mental Status: She is alert and oriented to person,  place, and time.     Deep Tendon Reflexes:     Reflex Scores:      Patellar reflexes are 1+ on the right side and 1+ on the left side.      Achilles reflexes are 1+ on the right side and 1+ on the left side. Psychiatric:        Behavior: Behavior normal.     Assessment/Plan 1. Sacroiliac pain Back pain without known trauma or injury.  She has not had improvement with taking naproxen or ibuprofen (although ibuprofen dose was low).  We are going to try a prednisone burst to try and take down inflammation.  Trial of different muscle relaxer since she has not noted any improvement with Flexeril.  We discussed using heat for muscle spasm.  We reviewed stretching in detail including how to stretch the sacroiliac area as well as hamstrings and lower back.  I have encouraged her to let me know if she is not noting significant improvement by next week.  I would suggest outpatient physical therapy at that point. I do not feel that imaging would aid in treatment plan at this point but could be considered if not improving as expected.   - metaxalone (SKELAXIN) 800 MG tablet; Take 1 tablet (800 mg total) by mouth 2 (two) times daily as needed for muscle spasms.  Dispense: 30 tablet; Refill: 0 - predniSONE (DELTASONE) 20 MG tablet; Take 3 tabs daily x 3 days, then 2 tabs daily x 3 days, then 1 tab daily x 2 days, then 1/2 tab daily x 2 days  Dispense: 18 tablet; Refill: 0     Return if symptoms worsen or fail to improve.     Micheline Rough, MD

## 2018-08-08 ENCOUNTER — Telehealth: Payer: Self-pay | Admitting: Internal Medicine

## 2018-08-08 DIAGNOSIS — I10 Essential (primary) hypertension: Secondary | ICD-10-CM

## 2018-08-08 DIAGNOSIS — J3089 Other allergic rhinitis: Secondary | ICD-10-CM

## 2018-08-08 MED ORDER — TRIAMTERENE-HCTZ 37.5-25 MG PO CAPS: 1.0000 | ORAL_CAPSULE | Freq: Every day | ORAL | 5 refills | Status: DC

## 2018-08-08 NOTE — Telephone Encounter (Signed)
ok 

## 2018-08-08 NOTE — Telephone Encounter (Signed)
Copied from [place] [id number]. Topic: Quick Communication - Rx Refill/Question >> Aug 08, 2018  9:25 AM Leward Quan A wrote: Medication: triamterene-hydrochlorothiazide (DYAZIDE) 37.5-25 MG capsule   Has the patient contacted their pharmacy? Yes.   (Agent: If no, request that the patient contact the pharmacy for the refill.) (Agent: If yes, when and what did the pharmacy advise?)  Preferred Pharmacy (with phone number or street name): [facility] [phone] - [place], [place] - [address] [phone] (Phone) [phone] (Fax)    Agent: Please be advised that RX refills may take up to 3 business days. We ask that you follow-up with your pharmacy.

## 2018-08-08 NOTE — Telephone Encounter (Signed)
Ok to fill 

## 2018-08-08 NOTE — Telephone Encounter (Signed)
Rx has been sent. Patient is aware. 

## 2018-08-08 NOTE — Telephone Encounter (Signed)
Pt has TOC 08/17/18

## 2018-08-10 ENCOUNTER — Telehealth: Payer: Self-pay

## 2018-08-10 NOTE — Telephone Encounter (Signed)
Spoke with patient. A webex appointment has been scheduled. Patient is aware to check their email for instructions and link.

## 2018-08-17 ENCOUNTER — Ambulatory Visit (INDEPENDENT_AMBULATORY_CARE_PROVIDER_SITE_OTHER): Payer: 59 | Admitting: Family Medicine

## 2018-08-17 ENCOUNTER — Other Ambulatory Visit: Payer: Self-pay

## 2018-08-17 ENCOUNTER — Encounter: Payer: Self-pay | Admitting: Family Medicine

## 2018-08-17 DIAGNOSIS — J3089 Other allergic rhinitis: Secondary | ICD-10-CM | POA: Diagnosis not present

## 2018-08-17 DIAGNOSIS — M25511 Pain in right shoulder: Secondary | ICD-10-CM | POA: Diagnosis not present

## 2018-08-17 DIAGNOSIS — G8929 Other chronic pain: Secondary | ICD-10-CM

## 2018-08-17 DIAGNOSIS — I10 Essential (primary) hypertension: Secondary | ICD-10-CM | POA: Diagnosis not present

## 2018-08-17 DIAGNOSIS — M25512 Pain in left shoulder: Secondary | ICD-10-CM

## 2018-08-17 NOTE — Progress Notes (Signed)
Virtual Visit via Video Note  I connected with@ on 08/17/18 at  2:30 PM EDT by a video enabled telemedicine application and verified that I am speaking with the correct person using two identifiers.  Location patient: home Location provider:work or home office Persons participating in the virtual visit: patient, provider  I discussed the limitations of evaluation and management by telemedicine and the availability of in person appointments. The patient expressed understanding and agreed to proceed.   Morgan Wolfe DOB: [birth year] Encounter date: 08/17/2018  This isa 63 y.o. female who presents to establish care. Chief Complaint  Patient presents with  . Transitions Of Care    History of present illness:   Only problem she is having is shoulders. Was doing therapy; was seeing dr. Tamera Punt as well. Was told that rotator cuffs were bad; but therapy didn't help. Has had one or two shots in shoulder. Bothers her more at night. Started therapy Jan or Feb. Main problem is at night. Can't sleep on either side. Just painful. Naproxen didn't help. When right shoulder hurting badly it makes right side of head hurt. Does use heat, tries to do exercises. Does have pain up both sides of neck. Shoulders improved after prednisone.   HTN: does check at home; in "normal range"  Meclizine just as needed. Maybe twice yearly and lasts 2 days.  Has been on premarin for years. Started after hysterectomy.   Eyes irritated from allergies, but generally well controlled. Has dry cough on occasion.    Past Medical History:  Diagnosis Date  . ALLERGIC RHINITIS   . Hypertension   . Right shoulder pain    Past Surgical History:  Procedure Laterality Date  . ANKLE SURGERY Left 2000  . TOE SURGERY Right 1998   bone spur  . VAGINAL HYSTERECTOMY  1980   per Dr. Harrington Challenger for fibroids; no cancer   Allergies  Allergen Reactions  . Augmentin [Amoxicillin-Pot Clavulanate] Nausea And Vomiting    Diarrhea   .  Sulfonamide Derivatives Itching   Current Meds  Medication Sig  . estrogens, conjugated, (PREMARIN) 0.625 MG tablet TAKE 1 TABLET DAILY FOR 21  DAYS THEN DO NOT TAKE FOR 7 DAYS  . fluticasone (FLONASE) 50 MCG/ACT nasal spray Place 2 sprays into both nostrils daily.  Marland Kitchen ibuprofen (ADVIL) 200 MG tablet Take 400 mg by mouth every 6 (six) hours as needed.  . meclizine (ANTIVERT) 25 MG tablet Take 1 tablet (25 mg total) by mouth 3 (three) times daily as needed for dizziness.  . potassium chloride SA (K-DUR,KLOR-CON) 20 MEQ tablet Take 1 tablet (20 mEq total) by mouth daily.  Marland Kitchen triamterene-hydrochlorothiazide (DYAZIDE) 37.5-25 MG capsule Take 1 each (1 capsule total) by mouth daily.  . [DISCONTINUED] metaxalone (SKELAXIN) 800 MG tablet Take 1 tablet (800 mg total) by mouth 2 (two) times daily as needed for muscle spasms.  . [DISCONTINUED] predniSONE (DELTASONE) 20 MG tablet Take 3 tabs daily x 3 days, then 2 tabs daily x 3 days, then 1 tab daily x 2 days, then 1/2 tab daily x 2 days   Social History   Tobacco Use  . Smoking status: Never Smoker  . Smokeless tobacco: Never Used  Substance Use Topics  . Alcohol use: No   Family History  Problem Relation Age of Onset  . Coronary artery disease Other        1st degress relatives <50  . Hypertension Other   . Heart attack Brother 30  . Hyperlipidemia Other   .  Glaucoma Mother   . Pancreatic cancer Father   . Brain cancer Sister   . Stroke Brother 50  . Colon cancer Neg Hx   . Stomach cancer Neg Hx      Review of Systems  Constitutional: Negative for chills, fatigue and fever.  Respiratory: Positive for cough (on occasion, dry cough). Negative for chest tightness, shortness of breath and wheezing.   Cardiovascular: Negative for chest pain, palpitations and leg swelling.  Musculoskeletal: Positive for arthralgias (notes most in bilat shoulders). Negative for joint swelling.    Objective:  There were no vitals taken for this visit.       BP Readings from Last 3 Encounters:  07/20/18 118/60  01/10/18 100/64  01/04/17 124/78   Wt Readings from Last 3 Encounters:  07/20/18 209 lb 11.2 oz (95.1 kg)  03/14/18 197 lb (89.4 kg)  01/11/18 197 lb (89.4 kg)    EXAM:  GENERAL: alert, oriented, appears well and in no acute distress  HEENT: atraumatic, conjunctiva clear, no obvious abnormalities on inspection of external nose and ears  NECK: normal movements of the head and neck  LUNGS: on inspection no signs of respiratory distress, breathing rate appears normal, no obvious gross SOB, gasping or wheezing  CV: no obvious cyanosis  MS: moves all visible extremities without noticeable abnormality  PSYCH/NEURO: pleasant and cooperative, no obvious depression or anxiety, speech and thought processing grossly intact   Assessment/Plan 1. Essential hypertension Stable; continue current medicatins. - Comprehensive metabolic panel; Future - CBC with Differential/Platelet; Future  2. Allergic rhinitis due to fungal spores, unspecified seasonality Cont flonase.  3. Chronic pain of both shoulders Would like to have ortho office note; will see if we can get release signed when in office next. Have asked her to call them for follow up since she has not had improvement with interventions thus far (did improve after I gave her prednisone for acute back injury); will look for other inflammation. Would consider Polymyalgia; bw pending. - ANA; Future - Sedimentation rate; Future - C-reactive protein; Future     I discussed the assessment and treatment plan with the patient. The patient was provided an opportunity to ask questions and all were answered. The patient agreed with the plan and demonstrated an understanding of the instructions.   The patient was advised to call back or seek an in-person evaluation if the symptoms worsen or if the condition fails to improve as anticipated.   Micheline Rough, MD

## 2018-09-21 ENCOUNTER — Other Ambulatory Visit: Payer: Self-pay | Admitting: *Deleted

## 2018-09-21 MED ORDER — ESTROGENS CONJUGATED 0.625 MG PO TABS: ORAL_TABLET | ORAL | 1 refills | Status: DC

## 2018-11-14 ENCOUNTER — Encounter: Payer: Self-pay | Admitting: Orthopedic Surgery

## 2018-11-14 ENCOUNTER — Other Ambulatory Visit: Payer: Self-pay

## 2018-11-14 ENCOUNTER — Ambulatory Visit: Payer: 59 | Admitting: Orthopedic Surgery

## 2018-11-14 ENCOUNTER — Ambulatory Visit (INDEPENDENT_AMBULATORY_CARE_PROVIDER_SITE_OTHER): Payer: 59 | Admitting: Orthopedic Surgery

## 2018-11-14 VITALS — Ht 66.0 in | Wt 209.7 lb

## 2018-11-14 DIAGNOSIS — M25872 Other specified joint disorders, left ankle and foot: Secondary | ICD-10-CM | POA: Diagnosis not present

## 2018-11-14 NOTE — Progress Notes (Signed)
Office Visit Note   Patient: Morgan Wolfe           Date of Birth: 1955-09-18           MRN: [id number] Visit Date: 11/14/2018              Requested by: Caren Macadam, MD [address] [place],  [place] [address] PCP: Caren Macadam, MD  Chief Complaint  Patient presents with  . Left Ankle - Pain, Follow-up      HPI: Patient is a 63 year old woman who presents in follow-up for impingement symptoms left ankle.  She works on her feet 8 hours a day at [employer].  She states the pain level varies she states she has had temporary relief with steroid injections.  Assessment & Plan: Visit Diagnoses:  1. Impingement syndrome of left ankle     Plan: Discussed with the patient we could proceed with left ankle arthroscopy.  Discussed that this should relieve approximately 75% of her symptoms.  Discussed that if this did not provide her sufficient relief other options may include ankle or subtalar fusion.  Risks and benefits were discussed including infection neurovascular injury persistent pain need for additional surgery.  Patient states she understands wished to proceed at this time.  Follow-Up Instructions: Return in about 1 week (around 11/21/2018).   Ortho Exam  Patient is alert, oriented, no adenopathy, well-dressed, normal affect, normal respiratory effort. Examination patient has a good pulse she has good ankle and subtalar motion.  Anterior drawer has a little laxity with some crepitation with the anterior drawer.  Her posterior tibial tendon and peroneal tendons are nontender to palpation she is directly tender to palpation anteriorly over the ankle joint.  She can do a single limb heel raise and does not have posterior tibial pain with this function this reproduces hindfoot varus she does have increased pace planus and pronation and valgus on the left compared to the right which is causing increased stress over the lateral joint line.  Imaging: No results  found. No images are attached to the encounter.  Labs: No results found for: HGBA1C, ESRSEDRATE, CRP, LABURIC, REPTSTATUS, GRAMSTAIN, CULT, LABORGA   Lab Results  Component Value Date   ALBUMIN 4.1 01/10/2018   ALBUMIN 3.7 01/04/2017   ALBUMIN 4.0 02/06/2015    Body mass index is 33.85 kg/m.  Orders:  No orders of the defined types were placed in this encounter.  No orders of the defined types were placed in this encounter.    Procedures: No procedures performed  Clinical Data: No additional findings.  ROS:  All other systems negative, except as noted in the HPI. Review of Systems  Objective: Vital Signs: Ht 5\' 6"  (1.676 m)   Wt 209 lb 11.2 oz (95.1 kg)   BMI 33.85 kg/m   Specialty Comments:  No specialty comments available.  PMFS History: Patient Active Problem List   Diagnosis Date Noted  . Impingement syndrome of left ankle 01/11/2018  . Lateral epicondylitis (tennis elbow) 08/31/2016  . Essential hypertension 12/06/2006  . Allergic rhinitis 12/06/2006   Past Medical History:  Diagnosis Date  . ALLERGIC RHINITIS   . Hypertension   . Right shoulder pain     Family History  Problem Relation Age of Onset  . Coronary artery disease Other        1st degress relatives <50  . Hypertension Other   . Heart attack Brother 1  . Hyperlipidemia Other   .  Glaucoma Mother   . Pancreatic cancer Father   . Brain cancer Sister   . Stroke Brother 58  . Colon cancer Neg Hx   . Stomach cancer Neg Hx     Past Surgical History:  Procedure Laterality Date  . ANKLE SURGERY Left 2000  . TOE SURGERY Right 1998   bone spur  . VAGINAL HYSTERECTOMY  1980   per Dr. Harrington Challenger for fibroids; no cancer   Social History   Occupational History  . Not on file  Tobacco Use  . Smoking status: Never Smoker  . Smokeless tobacco: Never Used  Substance and Sexual Activity  . Alcohol use: No  . Drug use: No  . Sexual activity: Not on file

## 2018-12-26 ENCOUNTER — Encounter (HOSPITAL_BASED_OUTPATIENT_CLINIC_OR_DEPARTMENT_OTHER): Payer: Self-pay

## 2018-12-26 ENCOUNTER — Other Ambulatory Visit: Payer: Self-pay

## 2018-12-30 ENCOUNTER — Other Ambulatory Visit (HOSPITAL_COMMUNITY)
Admission: RE | Admit: 2018-12-30 | Discharge: 2018-12-30 | Disposition: A | Discharge status: Home / Self Care | Payer: 59 | Source: Ambulatory Visit | Attending: Orthopedic Surgery | Admitting: Orthopedic Surgery

## 2018-12-30 DIAGNOSIS — Z20828 Contact with and (suspected) exposure to other viral communicable diseases: Secondary | ICD-10-CM | POA: Diagnosis not present

## 2018-12-30 DIAGNOSIS — Z01812 Encounter for preprocedural laboratory examination: Secondary | ICD-10-CM | POA: Insufficient documentation

## 2018-12-30 LAB — SARS CORONAVIRUS 2 (TAT 6-24 HRS): SARS Coronavirus 2: NEGATIVE

## 2019-01-02 ENCOUNTER — Other Ambulatory Visit: Payer: Self-pay

## 2019-01-02 ENCOUNTER — Encounter (HOSPITAL_BASED_OUTPATIENT_CLINIC_OR_DEPARTMENT_OTHER)
Admission: RE | Admit: 2019-01-02 | Discharge: 2019-01-02 | Disposition: A | Discharge status: Home / Self Care | Payer: 59 | Source: Ambulatory Visit | Attending: Orthopedic Surgery | Admitting: Orthopedic Surgery

## 2019-01-02 DIAGNOSIS — E669 Obesity, unspecified: Secondary | ICD-10-CM | POA: Diagnosis not present

## 2019-01-02 DIAGNOSIS — I10 Essential (primary) hypertension: Secondary | ICD-10-CM | POA: Diagnosis not present

## 2019-01-02 DIAGNOSIS — Z79890 Hormone replacement therapy (postmenopausal): Secondary | ICD-10-CM | POA: Diagnosis not present

## 2019-01-02 DIAGNOSIS — Z79899 Other long term (current) drug therapy: Secondary | ICD-10-CM | POA: Diagnosis not present

## 2019-01-02 DIAGNOSIS — M25872 Other specified joint disorders, left ankle and foot: Secondary | ICD-10-CM | POA: Diagnosis present

## 2019-01-02 DIAGNOSIS — Z6833 Body mass index (BMI) 33.0-33.9, adult: Secondary | ICD-10-CM | POA: Diagnosis not present

## 2019-01-02 LAB — BASIC METABOLIC PANEL
Anion gap: 10 (ref 5–15)
BUN: 11 mg/dL (ref 6–20)
CO2: 27 mmol/L (ref 22–32)
Calcium: 9.2 mg/dL (ref 8.9–10.3)
Chloride: 101 mmol/L (ref 98–111)
Creatinine, Ser: 0.87 mg/dL (ref 0.44–1.00)
GFR calc Af Amer: >60 mL/min (ref >60)
GFR calc non Af Amer: >60 mL/min (ref >60)
Glucose, Bld: 107 mg/dL — ABNORMAL HIGH (ref 70–99)
Potassium: 3.2 mmol/L — ABNORMAL LOW (ref 3.5–5.1)
Sodium: 138 mmol/L (ref 135–145)

## 2019-01-02 LAB — MRSA PCR SCREENING: MRSA by PCR: NEGATIVE

## 2019-01-02 MED ORDER — ENSURE PRE-SURGERY PO LIQD: 296.0000 mL | Freq: Once | ORAL | Status: DC

## 2019-01-02 NOTE — Progress Notes (Addendum)

## 2019-01-02 NOTE — Progress Notes (Addendum)
EKG reviewed by Dr. Sabra Heck, will proceed with surgery as scheduled.  K+=3.2, Dr. Fransisco Beau gave orders for pt to take potassium Chloride SA 20 Meq, once tonight and once in the am with a sip of water. Notified pt, pt verbalized understanding.

## 2019-01-03 ENCOUNTER — Encounter (HOSPITAL_BASED_OUTPATIENT_CLINIC_OR_DEPARTMENT_OTHER): Payer: Self-pay | Admitting: *Deleted

## 2019-01-03 ENCOUNTER — Ambulatory Visit (HOSPITAL_BASED_OUTPATIENT_CLINIC_OR_DEPARTMENT_OTHER): Payer: 59 | Admitting: Anesthesiology

## 2019-01-03 ENCOUNTER — Other Ambulatory Visit: Payer: Self-pay

## 2019-01-03 ENCOUNTER — Ambulatory Visit (HOSPITAL_BASED_OUTPATIENT_CLINIC_OR_DEPARTMENT_OTHER)
Admission: RE | Admit: 2019-01-03 | Discharge: 2019-01-03 | Disposition: A | Discharge status: Home / Self Care | Payer: 59 | Attending: Orthopedic Surgery | Admitting: Orthopedic Surgery

## 2019-01-03 ENCOUNTER — Encounter (HOSPITAL_BASED_OUTPATIENT_CLINIC_OR_DEPARTMENT_OTHER)
Admission: RE | Disposition: A | Discharge status: Home / Self Care | Payer: Self-pay | Source: Home / Self Care | Attending: Orthopedic Surgery

## 2019-01-03 DIAGNOSIS — M25872 Other specified joint disorders, left ankle and foot: Secondary | ICD-10-CM

## 2019-01-03 DIAGNOSIS — Z79899 Other long term (current) drug therapy: Secondary | ICD-10-CM | POA: Insufficient documentation

## 2019-01-03 DIAGNOSIS — I10 Essential (primary) hypertension: Secondary | ICD-10-CM | POA: Insufficient documentation

## 2019-01-03 DIAGNOSIS — Z79890 Hormone replacement therapy (postmenopausal): Secondary | ICD-10-CM | POA: Insufficient documentation

## 2019-01-03 DIAGNOSIS — Z6833 Body mass index (BMI) 33.0-33.9, adult: Secondary | ICD-10-CM | POA: Insufficient documentation

## 2019-01-03 DIAGNOSIS — E669 Obesity, unspecified: Secondary | ICD-10-CM | POA: Insufficient documentation

## 2019-01-03 HISTORY — DX: Other specified postprocedural states: R11.2

## 2019-01-03 HISTORY — PX: ANKLE ARTHROSCOPY: SHX545

## 2019-01-03 HISTORY — DX: Other specified postprocedural states: Z98.890

## 2019-01-03 SURGERY — ARTHROSCOPY, ANKLE
Anesthesia: General | Site: Ankle | Laterality: Left

## 2019-01-03 MED ORDER — FENTANYL CITRATE (PF) 100 MCG/2ML IJ SOLN
INTRAMUSCULAR | Status: AC
  Filled 2019-01-03: qty 2

## 2019-01-03 MED ORDER — OXYCODONE-ACETAMINOPHEN 5-325 MG PO TABS: 1.0000 | ORAL_TABLET | ORAL | 0 refills | Status: DC | PRN

## 2019-01-03 MED ORDER — SCOPOLAMINE 1 MG/3DAYS TD PT72: 1.0000 | MEDICATED_PATCH | TRANSDERMAL | Status: DC

## 2019-01-03 MED ORDER — PROPOFOL 10 MG/ML IV BOLUS
INTRAVENOUS | Status: DC | PRN
  Administered 2019-01-03: 150 mg via INTRAVENOUS

## 2019-01-03 MED ORDER — CEFAZOLIN SODIUM-DEXTROSE 2-4 GM/100ML-% IV SOLN
INTRAVENOUS | Status: AC
  Filled 2019-01-03: qty 100

## 2019-01-03 MED ORDER — MIDAZOLAM HCL 2 MG/2ML IJ SOLN
1.0000 mg | INTRAMUSCULAR | Status: DC | PRN
  Administered 2019-01-03: 2 mg via INTRAVENOUS

## 2019-01-03 MED ORDER — BUPIVACAINE HCL (PF) 0.25 % IJ SOLN
INTRAMUSCULAR | Status: AC
  Filled 2019-01-03: qty 30

## 2019-01-03 MED ORDER — OXYCODONE HCL 5 MG/5ML PO SOLN: 5.0000 mg | Freq: Once | ORAL | Status: DC | PRN

## 2019-01-03 MED ORDER — MIDAZOLAM HCL 2 MG/2ML IJ SOLN
INTRAMUSCULAR | Status: AC
  Filled 2019-01-03: qty 2

## 2019-01-03 MED ORDER — ONDANSETRON HCL 4 MG/2ML IJ SOLN
INTRAMUSCULAR | Status: AC
  Filled 2019-01-03: qty 2

## 2019-01-03 MED ORDER — DEXAMETHASONE SODIUM PHOSPHATE 10 MG/ML IJ SOLN
INTRAMUSCULAR | Status: AC
  Filled 2019-01-03: qty 1

## 2019-01-03 MED ORDER — FENTANYL CITRATE (PF) 100 MCG/2ML IJ SOLN
50.0000 ug | INTRAMUSCULAR | Status: DC | PRN
  Administered 2019-01-03: 100 ug via INTRAVENOUS

## 2019-01-03 MED ORDER — METOCLOPRAMIDE HCL 5 MG/ML IJ SOLN: 10.0000 mg | Freq: Once | INTRAMUSCULAR | Status: DC | PRN

## 2019-01-03 MED ORDER — FENTANYL CITRATE (PF) 100 MCG/2ML IJ SOLN
25.0000 ug | INTRAMUSCULAR | Status: DC | PRN
  Administered 2019-01-03: 50 ug via INTRAVENOUS
  Administered 2019-01-03: 25 ug via INTRAVENOUS
  Administered 2019-01-03: 50 ug via INTRAVENOUS

## 2019-01-03 MED ORDER — LACTATED RINGERS IV SOLN
INTRAVENOUS | Status: DC
  Administered 2019-01-03: 08:00:00 via INTRAVENOUS

## 2019-01-03 MED ORDER — POVIDONE-IODINE 10 % EX SWAB: 2.0000 "application " | Freq: Once | CUTANEOUS | Status: DC

## 2019-01-03 MED ORDER — KETOROLAC TROMETHAMINE 30 MG/ML IJ SOLN
INTRAMUSCULAR | Status: AC
  Filled 2019-01-03: qty 1

## 2019-01-03 MED ORDER — SCOPOLAMINE 1 MG/3DAYS TD PT72
1.0000 | MEDICATED_PATCH | Freq: Once | TRANSDERMAL | Status: DC
  Administered 2019-01-03: 1.5 mg via TRANSDERMAL

## 2019-01-03 MED ORDER — SCOPOLAMINE 1 MG/3DAYS TD PT72
MEDICATED_PATCH | TRANSDERMAL | Status: AC
  Filled 2019-01-03: qty 1

## 2019-01-03 MED ORDER — DEXAMETHASONE SODIUM PHOSPHATE 4 MG/ML IJ SOLN
INTRAMUSCULAR | Status: DC | PRN
  Administered 2019-01-03: 10 mg via INTRAVENOUS

## 2019-01-03 MED ORDER — KETOROLAC TROMETHAMINE 30 MG/ML IJ SOLN
INTRAMUSCULAR | Status: DC | PRN
  Administered 2019-01-03: 30 mg via INTRAVENOUS

## 2019-01-03 MED ORDER — CEFAZOLIN SODIUM-DEXTROSE 2-4 GM/100ML-% IV SOLN
2.0000 g | INTRAVENOUS | Status: AC
  Administered 2019-01-03: 2 g via INTRAVENOUS

## 2019-01-03 MED ORDER — LIDOCAINE 2% (20 MG/ML) 5 ML SYRINGE
INTRAMUSCULAR | Status: AC
  Filled 2019-01-03: qty 5

## 2019-01-03 MED ORDER — OXYCODONE HCL 5 MG PO TABS: 5.0000 mg | ORAL_TABLET | Freq: Once | ORAL | Status: DC | PRN

## 2019-01-03 MED ORDER — ONDANSETRON HCL 4 MG/2ML IJ SOLN
INTRAMUSCULAR | Status: DC | PRN
  Administered 2019-01-03: 4 mg via INTRAVENOUS

## 2019-01-03 MED ORDER — SODIUM CHLORIDE 0.9 % IR SOLN
Status: DC | PRN
  Administered 2019-01-03: 3000 mL

## 2019-01-03 MED ORDER — LIDOCAINE HCL (CARDIAC) PF 100 MG/5ML IV SOSY
PREFILLED_SYRINGE | INTRAVENOUS | Status: DC | PRN
  Administered 2019-01-03: 75 mg via INTRAVENOUS

## 2019-01-03 MED ORDER — CHLORHEXIDINE GLUCONATE 4 % EX LIQD: 60.0000 mL | Freq: Once | CUTANEOUS | Status: DC

## 2019-01-03 SURGICAL SUPPLY — 34 items
BNDG CMPR 9X4 STRL LF SNTH (GAUZE/BANDAGES/DRESSINGS)
BNDG COHESIVE 4X5 TAN STRL (GAUZE/BANDAGES/DRESSINGS) IMPLANT
BNDG COHESIVE 6X5 TAN STRL LF (GAUZE/BANDAGES/DRESSINGS) ×2 IMPLANT
BNDG ESMARK 4X9 LF (GAUZE/BANDAGES/DRESSINGS) IMPLANT
COVER WAND RF STERILE (DRAPES) IMPLANT
DISSECTOR 4.0MM X 13CM (MISCELLANEOUS) ×2 IMPLANT
DRAPE ARTHROSCOPY W/POUCH 90 (DRAPES) ×2 IMPLANT
DRAPE OEC MINIVIEW 54X84 (DRAPES) IMPLANT
DRAPE U-SHAPE 47X51 STRL (DRAPES) ×2 IMPLANT
DRSG EMULSION OIL 3X3 NADH (GAUZE/BANDAGES/DRESSINGS) ×2 IMPLANT
DURAPREP 26ML APPLICATOR (WOUND CARE) ×2 IMPLANT
EXCALIBUR 3.8MM X 13CM (MISCELLANEOUS) ×2 IMPLANT
GAUZE SPONGE 4X4 12PLY STRL (GAUZE/BANDAGES/DRESSINGS) ×2 IMPLANT
GLOVE BIOGEL PI IND STRL 6.5 (GLOVE) IMPLANT
GLOVE BIOGEL PI IND STRL 8.5 (GLOVE) ×1 IMPLANT
GLOVE BIOGEL PI INDICATOR 6.5 (GLOVE) ×1
GLOVE BIOGEL PI INDICATOR 8.5 (GLOVE) ×1
GLOVE ECLIPSE 6.5 STRL STRAW (GLOVE) ×1 IMPLANT
GLOVE SURG ORTHO 9.0 STRL STRW (GLOVE) ×3 IMPLANT
GOWN STRL REUS W/ TWL LRG LVL3 (GOWN DISPOSABLE) ×1 IMPLANT
GOWN STRL REUS W/ TWL XL LVL3 (GOWN DISPOSABLE) ×1 IMPLANT
GOWN STRL REUS W/TWL LRG LVL3 (GOWN DISPOSABLE) ×2
GOWN STRL REUS W/TWL XL LVL3 (GOWN DISPOSABLE) ×2
MANIFOLD NEPTUNE II (INSTRUMENTS) ×1 IMPLANT
PACK ARTHROSCOPY DSU (CUSTOM PROCEDURE TRAY) ×2 IMPLANT
PACK BASIN DAY SURGERY FS (CUSTOM PROCEDURE TRAY) ×2 IMPLANT
PORT APPOLLO RF 90DEGREE MULTI (SURGICAL WAND) ×1 IMPLANT
STOCKINETTE TUBULAR 6 INCH (GAUZE/BANDAGES/DRESSINGS) ×1 IMPLANT
STRAP ANKLE FOOT DISTRACTOR (ORTHOPEDIC SUPPLIES) IMPLANT
SUT ETHILON 2 0 FSLX (SUTURE) ×2 IMPLANT
SYR 5ML LL (SYRINGE) IMPLANT
TOWEL GREEN STERILE FF (TOWEL DISPOSABLE) ×2 IMPLANT
TUBING ARTHROSCOPY IRRIG 16FT (MISCELLANEOUS) ×2 IMPLANT
WATER STERILE IRR 1000ML POUR (IV SOLUTION) ×2 IMPLANT

## 2019-01-03 NOTE — Transfer of Care (Signed)
Immediate Anesthesia Transfer of Care Note  Patient: Morgan Wolfe  Procedure(s) Performed: LEFT ANKLE ARTHROSCOPY AND DEBRIDEMENT (Left Ankle)  Patient Location: PACU  Anesthesia Type:General  Level of Consciousness: sedated and responds to stimulation  Airway & Oxygen Therapy: Patient Spontanous Breathing and Patient connected to face mask oxygen  Post-op Assessment: Report given to RN and Post -op Vital signs reviewed and stable  Post vital signs: Reviewed and stable  Last Vitals:  Vitals Value Taken Time  BP    Temp    Pulse 74 01/03/19 0911  Resp 11 01/03/19 0911  SpO2 100 % 01/03/19 0911  Vitals shown include unvalidated device data.  Last Pain:  Vitals:   01/03/19 0751  TempSrc: Oral  PainSc: 1       Patients Stated Pain Goal: 1 (61/44/31 5400)  Complications: No apparent anesthesia complications

## 2019-01-03 NOTE — Anesthesia Postprocedure Evaluation (Signed)
Anesthesia Post Note  Patient: Morgan Wolfe  Procedure(s) Performed: LEFT ANKLE ARTHROSCOPY AND DEBRIDEMENT (Left Ankle)     Patient location during evaluation: PACU Anesthesia Type: General Level of consciousness: awake and alert and oriented Pain management: pain level controlled Vital Signs Assessment: post-procedure vital signs reviewed and stable Respiratory status: spontaneous breathing, nonlabored ventilation and respiratory function stable Cardiovascular status: blood pressure returned to baseline and stable Postop Assessment: no apparent nausea or vomiting Anesthetic complications: no    Last Vitals:  Vitals:   01/03/19 0921 01/03/19 0930  BP:  116/75  Pulse: 80 72  Resp: 17 11  Temp:    SpO2: 100% 100%    Last Pain:  Vitals:   01/03/19 0935  TempSrc:   PainSc: 7                  Ashad Fawbush A.

## 2019-01-03 NOTE — Discharge Instructions (Signed)

## 2019-01-03 NOTE — Op Note (Signed)
01/03/2019  9:28 AM  PATIENT:  Morgan Wolfe    PRE-OPERATIVE DIAGNOSIS:  Impingement Left Ankle  POST-OPERATIVE DIAGNOSIS:  Same  PROCEDURE:  LEFT ANKLE ARTHROSCOPY AND DEBRIDEMENT  SURGEON:  Newt Minion, MD  PHYSICIAN ASSISTANT:None ANESTHESIA:   General  PREOPERATIVE INDICATIONS:  Morgan Wolfe is a  63 y.o. female with a diagnosis of Impingement Left Ankle who failed conservative measures and elected for surgical management.    The risks benefits and alternatives were discussed with the patient preoperatively including but not limited to the risks of infection, bleeding, nerve injury, cardiopulmonary complications, the need for revision surgery, among others, and the patient was willing to proceed.  OPERATIVE IMPLANTS: none  @ENCIMAGES @  OPERATIVE FINDINGS: Small osteochondral defect anterior medial outside the weightbearing surface.  OPERATIVE PROCEDURE: Patient was brought the operating room underwent a general anesthetic.  After adequate levels anesthesia were obtained patient left lower extremity was prepped using DuraPrep draped into a sterile field a timeout was called.  A 18-gauge spinal was used to establish the anterior medial portal the skin was incised blunt dissection was carried down to the capsule and a blunt trocar was used inserted into the joint.  Visualization showed extensive synovitis anteriorly.  Using the light from the camera a anterior lateral portal was established with an outside in technique with an 18-gauge spinal care was taken not to disrupt any neurovascular structures.  The skin was incised blunt dissection was carried down to the capsule and a blunt trocar was used inserted into the joint.  Visualization showed extensive synovitis.  The electrical wand and shaver were used to debride the synovitis.  The medial lateral gutters were debrided anteriorly was also debrided.  The articular cartilage of the joint was intact however there was a small  osteochondral defect of the medial talar dome.  The gutters were were cleansed inflammatory synovial lining.  The joint was probed there were no loose bodies no soft areas of the cartilage.  After hemostasis was obtained the instruments were removed the portals were closed using 2-0 nylon a sterile dressing was applied patient was extubated taken the PACU in stable condition.   DISCHARGE PLANNING:  Antibiotic duration: Preoperative antibiotics  Weightbearing: Weightbearing as tolerated  Pain medication: Prescription for Percocet  Dressing care/ Wound VAC: Remove dressing in 2 days  Ambulatory devices: Crutches  Discharge to: Home.  Follow-up: In the office 1 week post operative.

## 2019-01-03 NOTE — H&P (Signed)
Morgan Wolfe is an 63 y.o. female.   Chief Complaint: Impingement pain left ankle. HPI: Patient is a 63 year old woman with impingement pain left ankle. She works on her feet 8 hours a day at [employer].  She states the pain level varies she states she has had temporary relief with steroid injections..  Past Medical History:  Diagnosis Date  . ALLERGIC RHINITIS   . Hypertension   . PONV (postoperative nausea and vomiting)   . Right shoulder pain     Past Surgical History:  Procedure Laterality Date  . ANKLE SURGERY Left 2000  . ELBOW ARTHROSCOPY Right 2019  . TOE SURGERY Right 1998   bone spur  . VAGINAL HYSTERECTOMY  1980   per Dr. Harrington Challenger for fibroids; no cancer    Family History  Problem Relation Age of Onset  . Coronary artery disease Other        1st degress relatives <50  . Hypertension Other   . Heart attack Brother 33  . Hyperlipidemia Other   . Glaucoma Mother   . Pancreatic cancer Father   . Brain cancer Sister   . Stroke Brother 19  . Colon cancer Neg Hx   . Stomach cancer Neg Hx    Social History:  reports that she has never smoked. She has never used smokeless tobacco. She reports that she does not drink alcohol or use drugs.  Allergies:  Allergies  Allergen Reactions  . Augmentin [Amoxicillin-Pot Clavulanate] Nausea And Vomiting    Diarrhea   . Sulfonamide Derivatives Itching    No medications prior to admission.    Results for orders placed or performed during the hospital encounter of 01/03/19 (from the past 48 hour(s))  MRSA PCR Screening     Status: None   Collection Time: 01/02/19  9:36 AM   Specimen: Nasal Mucosa; Nasopharyngeal  Result Value Ref Range   MRSA by PCR NEGATIVE NEGATIVE    Comment:        The GeneXpert MRSA Assay (FDA approved for NASAL specimens only), is one component of a comprehensive MRSA colonization surveillance program. It is not intended to diagnose MRSA infection nor to guide or monitor treatment for MRSA  infections. Performed at [facility], [place] [address] [place], [place] [address]   Basic metabolic panel     Status: Abnormal   Collection Time: 01/02/19  9:37 AM  Result Value Ref Range   Sodium 138 135 - 145 mmol/L   Potassium 3.2 (L) 3.5 - 5.1 mmol/L   Chloride 101 98 - 111 mmol/L   CO2 27 22 - 32 mmol/L   Glucose, Bld 107 (H) 70 - 99 mg/dL   BUN 11 6 - 20 mg/dL   Creatinine, Ser 0.87 0.44 - 1.00 mg/dL   Calcium 9.2 8.9 - 10.3 mg/dL   GFR calc non Af Amer >60 >60 mL/min   GFR calc Af Amer >60 >60 mL/min   Anion gap 10 5 - 15    Comment: Performed at [facility], [place] [address], [place], [place] [address]   No results found.  ROS  Height 5\' 6"  (1.676 m), weight 94.8 kg. Physical Exam  Patient is alert, oriented, no adenopathy, well-dressed, normal affect, normal respiratory effort. Examination patient has a good pulse she has good ankle and subtalar motion.  Anterior drawer has a little laxity with some crepitation with the anterior drawer.  Her posterior tibial tendon and peroneal tendons are nontender to palpation she is  directly tender to palpation anteriorly over the ankle joint.  She can do a single limb heel raise and does not have posterior tibial pain with this function this reproduces hindfoot varus she does have increased pace planus and pronation and valgus on the left compared to the right which is causing increased stress over the lateral joint line. Assessment/Plan 1. Impingement syndrome of left ankle     Plan: Discussed with the patient we could proceed with left ankle arthroscopy.  Discussed that this should relieve approximately 75% of her symptoms.  Discussed that if this did not provide her sufficient relief other options may include ankle or subtalar fusion.  Risks and benefits were discussed including infection neurovascular injury persistent pain need for additional surgery.  Patient states she understands wished to proceed at this  time.   Newt Minion, MD 01/03/2019, 6:53 AM

## 2019-01-03 NOTE — Anesthesia Procedure Notes (Signed)
Procedure Name: LMA Insertion Date/Time: 01/03/2019 8:43 AM Performed by: [name], CRNA Pre-anesthesia Checklist: Patient identified, Emergency Drugs available, Suction available and Patient being monitored Patient Re-evaluated:Patient Re-evaluated prior to induction Oxygen Delivery Method: Circle system utilized Preoxygenation: Pre-oxygenation with 100% oxygen Induction Type: IV induction Ventilation: Mask ventilation without difficulty LMA: LMA inserted LMA Size: 4.0 Number of attempts: 1 Airway Equipment and Method: Bite block Placement Confirmation: positive ETCO2 Tube secured with: Tape Dental Injury: Teeth and Oropharynx as per pre-operative assessment

## 2019-01-03 NOTE — Anesthesia Preprocedure Evaluation (Signed)
Anesthesia Evaluation  Patient identified by MRN, date of birth, ID band Patient awake    Reviewed: Allergy & Precautions, NPO status , Patient's Chart, lab work & pertinent test results  History of Anesthesia Complications (+) PONV and history of anesthetic complications  Airway Mallampati: II  TM Distance: >3 FB Neck ROM: Full    Dental no notable dental hx. (+) Teeth Intact   Pulmonary neg pulmonary ROS,    Pulmonary exam normal breath sounds clear to auscultation       Cardiovascular hypertension, Pt. on medications Normal cardiovascular exam Rhythm:Regular Rate:Normal  EKG - NSR, LVH, anterior infarct   Neuro/Psych negative neurological ROS  negative psych ROS   GI/Hepatic negative GI ROS, Neg liver ROS,   Endo/Other  Obesity  Renal/GU negative Renal ROS  negative genitourinary   Musculoskeletal  (+) Arthritis , Osteoarthritis,  Impingement syndrome left ankle   Abdominal (+) + obese,   Peds  Hematology negative hematology ROS (+)   Anesthesia Other Findings   Reproductive/Obstetrics                             Anesthesia Physical Anesthesia Plan  ASA: II  Anesthesia Plan: General   Post-op Pain Management:    Induction: Intravenous  PONV Risk Score and Plan: 4 or greater and Ondansetron, Dexamethasone, Midazolam, Treatment may vary due to age or medical condition and Scopolamine patch - Pre-op  Airway Management Planned: LMA  Additional Equipment:   Intra-op Plan:   Post-operative Plan: Extubation in OR  Informed Consent: I have reviewed the patients History and Physical, chart, labs and discussed the procedure including the risks, benefits and alternatives for the proposed anesthesia with the patient or authorized representative who has indicated his/her understanding and acceptance.     Dental advisory given  Plan Discussed with: CRNA and Surgeon  Anesthesia  Plan Comments:         Anesthesia Quick Evaluation

## 2019-01-04 ENCOUNTER — Encounter (HOSPITAL_BASED_OUTPATIENT_CLINIC_OR_DEPARTMENT_OTHER): Payer: Self-pay | Admitting: Orthopedic Surgery

## 2019-01-10 ENCOUNTER — Ambulatory Visit (INDEPENDENT_AMBULATORY_CARE_PROVIDER_SITE_OTHER): Payer: 59 | Admitting: Family

## 2019-01-10 ENCOUNTER — Encounter: Payer: Self-pay | Admitting: Family

## 2019-01-10 VITALS — Ht 66.0 in | Wt 201.0 lb

## 2019-01-10 DIAGNOSIS — M25872 Other specified joint disorders, left ankle and foot: Secondary | ICD-10-CM

## 2019-01-10 NOTE — Progress Notes (Signed)
   Post-Op Visit Note   Patient: Morgan Wolfe           Date of Birth: 07-08-1955           MRN: [id number] Visit Date: 01/10/2019 PCP: Caren Macadam, MD  Chief Complaint:  Chief Complaint  Patient presents with  . Left Ankle - Routine Post Op    01/03/19 left ankle scope and debridement     HPI:  HPI The patient is a 63 year old woman who presents today status post left ankle arthroscopy with debridement on the 18th of this month.  She is full weightbearing in a postop shoe has no concerns.  She states that she is having little stiffness but over the night no pain. Ortho Exam Portals are clean and dry sutures in place there is no erythema no drainage minimal swelling to her ankle.  Does have good range of motion  Visit Diagnoses:  1. Impingement syndrome of left ankle     Plan: Advance weightbearing in regular shoe as tolerated sutures harvested today keep these clean and dry.  No swimming.  Follow-up in couple weeks.  Follow-Up Instructions: Return in about 16 days (around 01/26/2019).   Imaging: No results found.  Orders:  No orders of the defined types were placed in this encounter.  No orders of the defined types were placed in this encounter.    PMFS History: Patient Active Problem List   Diagnosis Date Noted  . Impingement syndrome of left ankle 01/11/2018  . Lateral epicondylitis (tennis elbow) 08/31/2016  . Essential hypertension 12/06/2006  . Allergic rhinitis 12/06/2006   Past Medical History:  Diagnosis Date  . ALLERGIC RHINITIS   . Hypertension   . PONV (postoperative nausea and vomiting)   . Right shoulder pain     Family History  Problem Relation Age of Onset  . Coronary artery disease Other        1st degress relatives <50  . Hypertension Other   . Heart attack Brother 16  . Hyperlipidemia Other   . Glaucoma Mother   . Pancreatic cancer Father   . Brain cancer Sister   . Stroke Brother 60  . Colon cancer Neg Hx   . Stomach cancer  Neg Hx     Past Surgical History:  Procedure Laterality Date  . ANKLE ARTHROSCOPY Left 01/03/2019   Procedure: LEFT ANKLE ARTHROSCOPY AND DEBRIDEMENT;  Surgeon: Newt Minion, MD;  Location: [facility];  Service: Orthopedics;  Laterality: Left;  . ANKLE SURGERY Left 2000  . ELBOW ARTHROSCOPY Right 2019  . TOE SURGERY Right 1998   bone spur  . VAGINAL HYSTERECTOMY  1980   per Dr. Harrington Challenger for fibroids; no cancer   Social History   Occupational History  . Not on file  Tobacco Use  . Smoking status: Never Smoker  . Smokeless tobacco: Never Used  Substance and Sexual Activity  . Alcohol use: No  . Drug use: No  . Sexual activity: Not on file

## 2019-01-16 ENCOUNTER — Telehealth: Payer: Self-pay

## 2019-01-16 NOTE — Telephone Encounter (Signed)
Received email from billing department that the pt is looking for FMLA paperwork had ankle surgery this month. I have not seen anything that has come over from [facility] have you seen it?

## 2019-01-17 NOTE — Telephone Encounter (Signed)
IC,informed pt that we haven't received forms,only request for records from [place] I processed on 8/26. She understood.

## 2019-01-24 ENCOUNTER — Ambulatory Visit (INDEPENDENT_AMBULATORY_CARE_PROVIDER_SITE_OTHER): Payer: 59 | Admitting: Orthopedic Surgery

## 2019-01-24 ENCOUNTER — Encounter: Payer: Self-pay | Admitting: Orthopedic Surgery

## 2019-01-24 VITALS — Ht 66.0 in | Wt 201.0 lb

## 2019-01-24 DIAGNOSIS — M25872 Other specified joint disorders, left ankle and foot: Secondary | ICD-10-CM

## 2019-01-24 NOTE — Progress Notes (Signed)
Office Visit Note   Patient: Morgan Wolfe           Date of Birth: [birth year]           MRN: [id number] Visit Date: 01/24/2019              Requested by: Caren Macadam, MD [address] [place],  [place] [address] PCP: Caren Macadam, MD  Chief Complaint  Patient presents with  . Left Ankle - Routine Post Op    01/03/19 left ankle scope and debridement       HPI: Patient is a 63 year old woman who presents follow-up status post left ankle arthroscopy with debridement and decompression.  Patient is full weightbearing in regular shoes she does note a little bit of swelling but otherwise states he is doing well.  She states she has not been on her feet for prolonged period of time and is concerned that she could not go back to standing on her feet for an entire day.  Assessment & Plan: Visit Diagnoses:  1. Impingement syndrome of left ankle     Plan: Patient was given a note to return to work on October 19.  Continue scar massage recommended a stiff soled sneaker to unload pressure across the metatarsal heads.  Follow-Up Instructions: Return if symptoms worsen or fail to improve.   Ortho Exam  Patient is alert, oriented, no adenopathy, well-dressed, normal affect, normal respiratory effort. Patient states that with prolonged standing she has some numbness over the forefoot.  She has dorsiflexion to neutral with her knee extended she has pace planus.  The surgical incisions are well-healed she has excellent range of motion of the ankle.  Imaging: No results found. No images are attached to the encounter.  Labs: No results found for: HGBA1C, ESRSEDRATE, CRP, LABURIC, REPTSTATUS, GRAMSTAIN, CULT, LABORGA   Lab Results  Component Value Date   ALBUMIN 4.1 01/10/2018   ALBUMIN 3.7 01/04/2017   ALBUMIN 4.0 02/06/2015    No results found for: MG No results found for: VD25OH  No results found for: PREALBUMIN CBC EXTENDED Latest Ref Rng & Units 01/10/2018  01/04/2017 02/06/2015  WBC 4.0 - 10.5 K/uL 7.8 9.3 8.5  RBC 3.87 - 5.11 Mil/uL 4.49 4.36 4.51  HGB 12.0 - 15.0 g/dL 12.6 12.4 12.8  HCT 36.0 - 46.0 % 37.2 37.2 38.3  PLT 150.0 - 400.0 K/uL 324.0 359.0 332.0  NEUTROABS 1.4 - 7.7 K/uL 2.9 3.8 3.6  LYMPHSABS 0.7 - 4.0 K/uL 4.4(H) 4.9(H) 4.1(H)     Body mass index is 32.44 kg/m.  Orders:  No orders of the defined types were placed in this encounter.  No orders of the defined types were placed in this encounter.    Procedures: No procedures performed  Clinical Data: No additional findings.  ROS:  All other systems negative, except as noted in the HPI. Review of Systems  Objective: Vital Signs: Ht 5\' 6"  (1.676 m)   Wt 201 lb (91.2 kg)   BMI 32.44 kg/m   Specialty Comments:  No specialty comments available.  PMFS History: Patient Active Problem List   Diagnosis Date Noted  . Impingement syndrome of left ankle 01/11/2018  . Lateral epicondylitis (tennis elbow) 08/31/2016  . Essential hypertension 12/06/2006  . Allergic rhinitis 12/06/2006   Past Medical History:  Diagnosis Date  . ALLERGIC RHINITIS   . Hypertension   . PONV (postoperative nausea and vomiting)   . Right shoulder pain     Family  History  Problem Relation Age of Onset  . Coronary artery disease Other        1st degress relatives <50  . Hypertension Other   . Heart attack Brother 24  . Hyperlipidemia Other   . Glaucoma Mother   . Pancreatic cancer Father   . Brain cancer Sister   . Stroke Brother 48  . Colon cancer Neg Hx   . Stomach cancer Neg Hx     Past Surgical History:  Procedure Laterality Date  . ANKLE ARTHROSCOPY Left 01/03/2019   Procedure: LEFT ANKLE ARTHROSCOPY AND DEBRIDEMENT;  Surgeon: Newt Minion, MD;  Location: [facility];  Service: Orthopedics;  Laterality: Left;  . ANKLE SURGERY Left 2000  . ELBOW ARTHROSCOPY Right 2019  . TOE SURGERY Right 1998   bone spur  . VAGINAL HYSTERECTOMY  1980   per Dr. Harrington Challenger  for fibroids; no cancer   Social History   Occupational History  . Not on file  Tobacco Use  . Smoking status: Never Smoker  . Smokeless tobacco: Never Used  Substance and Sexual Activity  . Alcohol use: No  . Drug use: No  . Sexual activity: Not on file

## 2019-01-26 ENCOUNTER — Ambulatory Visit: Payer: 59 | Admitting: Orthopedic Surgery

## 2019-02-06 ENCOUNTER — Other Ambulatory Visit: Payer: Self-pay | Admitting: Internal Medicine

## 2019-02-06 DIAGNOSIS — Z1231 Encounter for screening mammogram for malignant neoplasm of breast: Secondary | ICD-10-CM

## 2019-02-07 ENCOUNTER — Telehealth: Payer: Self-pay | Admitting: Orthopedic Surgery

## 2019-02-07 NOTE — Telephone Encounter (Signed)
01/24/19 ov note faxed to [facility] [phone] per pts request

## 2019-02-23 ENCOUNTER — Other Ambulatory Visit: Payer: Self-pay

## 2019-02-23 ENCOUNTER — Telehealth: Payer: Self-pay | Admitting: Orthopedic Surgery

## 2019-02-23 ENCOUNTER — Ambulatory Visit (INDEPENDENT_AMBULATORY_CARE_PROVIDER_SITE_OTHER): Payer: 59 | Admitting: Orthopedic Surgery

## 2019-02-23 ENCOUNTER — Encounter: Payer: Self-pay | Admitting: Orthopedic Surgery

## 2019-02-23 ENCOUNTER — Ambulatory Visit (INDEPENDENT_AMBULATORY_CARE_PROVIDER_SITE_OTHER): Payer: 59

## 2019-02-23 VITALS — Ht 66.0 in | Wt 201.0 lb

## 2019-02-23 DIAGNOSIS — M25572 Pain in left ankle and joints of left foot: Secondary | ICD-10-CM | POA: Diagnosis not present

## 2019-02-23 NOTE — Telephone Encounter (Signed)
Pt is requesting her notes and summary from today's visit with dr.duda be faxed over to her disability [employer].

## 2019-02-24 ENCOUNTER — Encounter: Payer: Self-pay | Admitting: Orthopedic Surgery

## 2019-02-24 NOTE — Telephone Encounter (Signed)
Dr Sharol Given has noted dictated for office note for 02/23/2019 but will hold message until then.

## 2019-02-24 NOTE — Progress Notes (Signed)
Office Visit Note   Patient: Morgan Wolfe           Date of Birth: 1956/01/29           MRN: [id number] Visit Date: 02/23/2019              Requested by: Caren Macadam, MD [address] [place],  [place] [address] PCP: Caren Macadam, MD  Chief Complaint  Patient presents with  . Left Ankle - Routine Post Op    01/03/2019 LEFT ankle arthro & Deb      HPI: Patient is a 63 year old woman who presents 2 months status post left ankle arthroscopy and debridement.  Patient states she is supposed to return to work the middle of this month patient states she is only been able to ambulate about 2 hours on her foot and does not feel she can return to work in a full 8-hour day.  She states that her pain after ambulation is 7 out of 10.  Assessment & Plan: Visit Diagnoses:  1. Pain in left ankle and joints of left foot     Plan: Patient will continue to progress her activities as tolerated she will work on strengthening proprioception.  She is given a note to be out of work for 4 weeks with follow-up in 4 weeks to evaluate for return to work.  Follow-Up Instructions: Return in about 4 weeks (around 03/23/2019).   Ortho Exam  Patient is alert, oriented, no adenopathy, well-dressed, normal affect, normal respiratory effort. Examination patient has good pulses there is no ischemic changes no cellulitis no signs of infection.  The anterior aspect of the ankle joint is tender to palpation there is no signs of infection no signs of DVT.  Imaging: No results found. No images are attached to the encounter.  Labs: No results found for: HGBA1C, ESRSEDRATE, CRP, LABURIC, REPTSTATUS, GRAMSTAIN, CULT, LABORGA   Lab Results  Component Value Date   ALBUMIN 4.1 01/10/2018   ALBUMIN 3.7 01/04/2017   ALBUMIN 4.0 02/06/2015    No results found for: MG No results found for: VD25OH  No results found for: PREALBUMIN CBC EXTENDED Latest Ref Rng & Units 01/10/2018 01/04/2017  02/06/2015  WBC 4.0 - 10.5 K/uL 7.8 9.3 8.5  RBC 3.87 - 5.11 Mil/uL 4.49 4.36 4.51  HGB 12.0 - 15.0 g/dL 12.6 12.4 12.8  HCT 36.0 - 46.0 % 37.2 37.2 38.3  PLT 150.0 - 400.0 K/uL 324.0 359.0 332.0  NEUTROABS 1.4 - 7.7 K/uL 2.9 3.8 3.6  LYMPHSABS 0.7 - 4.0 K/uL 4.4(H) 4.9(H) 4.1(H)     Body mass index is 32.44 kg/m.  Orders:  Orders Placed This Encounter  Procedures  . XR Ankle 2 Views Left   No orders of the defined types were placed in this encounter.    Procedures: No procedures performed  Clinical Data: No additional findings.  ROS:  All other systems negative, except as noted in the HPI. Review of Systems  Objective: Vital Signs: Ht 5\' 6"  (1.676 m)   Wt 201 lb (91.2 kg)   BMI 32.44 kg/m   Specialty Comments:  No specialty comments available.  PMFS History: Patient Active Problem List   Diagnosis Date Noted  . Impingement syndrome of left ankle 01/11/2018  . Lateral epicondylitis (tennis elbow) 08/31/2016  . Essential hypertension 12/06/2006  . Allergic rhinitis 12/06/2006   Past Medical History:  Diagnosis Date  . ALLERGIC RHINITIS   . Hypertension   . PONV (postoperative nausea  and vomiting)   . Right shoulder pain     Family History  Problem Relation Age of Onset  . Coronary artery disease Other        1st degress relatives <50  . Hypertension Other   . Heart attack Brother 34  . Hyperlipidemia Other   . Glaucoma Mother   . Pancreatic cancer Father   . Brain cancer Sister   . Stroke Brother 52  . Colon cancer Neg Hx   . Stomach cancer Neg Hx     Past Surgical History:  Procedure Laterality Date  . ANKLE ARTHROSCOPY Left 01/03/2019   Procedure: LEFT ANKLE ARTHROSCOPY AND DEBRIDEMENT;  Surgeon: Newt Minion, MD;  Location: [facility];  Service: Orthopedics;  Laterality: Left;  . ANKLE SURGERY Left 2000  . ELBOW ARTHROSCOPY Right 2019  . TOE SURGERY Right 1998   bone spur  . VAGINAL HYSTERECTOMY  1980   per Dr. Harrington Challenger for  fibroids; no cancer   Social History   Occupational History  . Not on file  Tobacco Use  . Smoking status: Never Smoker  . Smokeless tobacco: Never Used  Substance and Sexual Activity  . Alcohol use: No  . Drug use: No  . Sexual activity: Not on file

## 2019-03-04 ENCOUNTER — Other Ambulatory Visit: Payer: Self-pay | Admitting: Family Medicine

## 2019-03-22 ENCOUNTER — Other Ambulatory Visit: Payer: Self-pay

## 2019-03-22 ENCOUNTER — Ambulatory Visit
Admission: RE | Admit: 2019-03-22 | Discharge: 2019-03-22 | Disposition: A | Discharge status: Home / Self Care | Payer: 59 | Source: Ambulatory Visit | Attending: Internal Medicine | Admitting: Internal Medicine

## 2019-03-22 DIAGNOSIS — Z1231 Encounter for screening mammogram for malignant neoplasm of breast: Secondary | ICD-10-CM

## 2019-03-23 ENCOUNTER — Encounter: Payer: Self-pay | Admitting: Orthopedic Surgery

## 2019-03-23 ENCOUNTER — Telehealth: Payer: Self-pay | Admitting: Orthopedic Surgery

## 2019-03-23 ENCOUNTER — Ambulatory Visit (INDEPENDENT_AMBULATORY_CARE_PROVIDER_SITE_OTHER): Payer: 59 | Admitting: Orthopedic Surgery

## 2019-03-23 VITALS — Ht 66.0 in | Wt 201.0 lb

## 2019-03-23 DIAGNOSIS — M25872 Other specified joint disorders, left ankle and foot: Secondary | ICD-10-CM

## 2019-03-23 NOTE — Telephone Encounter (Signed)
Pt forgot to ask if you could send her office visit notes from today to her [place].  Any questions give pt a call [phone]

## 2019-03-23 NOTE — Telephone Encounter (Signed)
Will fax to [facility] when dictation is done. Will hold message.

## 2019-03-25 ENCOUNTER — Encounter: Payer: Self-pay | Admitting: Orthopedic Surgery

## 2019-03-25 NOTE — Progress Notes (Signed)
Office Visit Note   Patient: Morgan Wolfe           Date of Birth: December 01, 1955           MRN: [id number] Visit Date: 03/23/2019              Requested by: Caren Macadam, MD [address] [place],  [place] [address] PCP: Caren Macadam, MD  Chief Complaint  Patient presents with  . Left Ankle - Routine Post Op    01/03/19 left ankle scope and debridement       HPI: Patient is a 63 year old woman who is 2 months status post left ankle arthroscopy and debridement.  She states she still has some swelling with lateral sided pain.  She states she has increased swelling after standing for 15 minutes.  Assessment & Plan: Visit Diagnoses:  1. Impingement syndrome of left ankle     Plan: Recommended physical therapy for strengthening patient states she does not want to proceed with physical therapy at this time.  She states she cannot return to work at this time and she was given a note to continue out of work for 4 weeks.  Discussed that ideally she will need seated work.  Follow-Up Instructions: Return in about 4 weeks (around 04/20/2019).   Ortho Exam  Patient is alert, oriented, no adenopathy, well-dressed, normal affect, normal respiratory effort. Examination there is swelling around the left ankle there is no redness no cellulitis no signs of infection.  Imaging: No results found. No images are attached to the encounter.  Labs: No results found for: HGBA1C, ESRSEDRATE, CRP, LABURIC, REPTSTATUS, GRAMSTAIN, CULT, LABORGA   Lab Results  Component Value Date   ALBUMIN 4.1 01/10/2018   ALBUMIN 3.7 01/04/2017   ALBUMIN 4.0 02/06/2015    No results found for: MG No results found for: VD25OH  No results found for: PREALBUMIN CBC EXTENDED Latest Ref Rng & Units 01/10/2018 01/04/2017 02/06/2015  WBC 4.0 - 10.5 K/uL 7.8 9.3 8.5  RBC 3.87 - 5.11 Mil/uL 4.49 4.36 4.51  HGB 12.0 - 15.0 g/dL 12.6 12.4 12.8  HCT 36.0 - 46.0 % 37.2 37.2 38.3  PLT 150.0 - 400.0  K/uL 324.0 359.0 332.0  NEUTROABS 1.4 - 7.7 K/uL 2.9 3.8 3.6  LYMPHSABS 0.7 - 4.0 K/uL 4.4(H) 4.9(H) 4.1(H)     Body mass index is 32.44 kg/m.  Orders:  No orders of the defined types were placed in this encounter.  No orders of the defined types were placed in this encounter.    Procedures: No procedures performed  Clinical Data: No additional findings.  ROS:  All other systems negative, except as noted in the HPI. Review of Systems  Objective: Vital Signs: Ht 5\' 6"  (1.676 m)   Wt 201 lb (91.2 kg)   BMI 32.44 kg/m   Specialty Comments:  No specialty comments available.  PMFS History: Patient Active Problem List   Diagnosis Date Noted  . Impingement syndrome of left ankle 01/11/2018  . Lateral epicondylitis (tennis elbow) 08/31/2016  . Essential hypertension 12/06/2006  . Allergic rhinitis 12/06/2006   Past Medical History:  Diagnosis Date  . ALLERGIC RHINITIS   . Hypertension   . PONV (postoperative nausea and vomiting)   . Right shoulder pain     Family History  Problem Relation Age of Onset  . Coronary artery disease Other        1st degress relatives <50  . Hypertension Other   . Heart attack  Brother 22  . Hyperlipidemia Other   . Glaucoma Mother   . Pancreatic cancer Father   . Brain cancer Sister   . Stroke Brother 72  . Colon cancer Neg Hx   . Stomach cancer Neg Hx     Past Surgical History:  Procedure Laterality Date  . ANKLE ARTHROSCOPY Left 01/03/2019   Procedure: LEFT ANKLE ARTHROSCOPY AND DEBRIDEMENT;  Surgeon: Newt Minion, MD;  Location: [facility];  Service: Orthopedics;  Laterality: Left;  . ANKLE SURGERY Left 2000  . ELBOW ARTHROSCOPY Right 2019  . TOE SURGERY Right 1998   bone spur  . VAGINAL HYSTERECTOMY  1980   per Dr. Harrington Challenger for fibroids; no cancer   Social History   Occupational History  . Not on file  Tobacco Use  . Smoking status: Never Smoker  . Smokeless tobacco: Never Used  Substance and Sexual  Activity  . Alcohol use: No  . Drug use: No  . Sexual activity: Not on file

## 2019-03-28 NOTE — Telephone Encounter (Signed)
Last note has been faxed to [facility]

## 2019-04-20 ENCOUNTER — Encounter: Payer: Self-pay | Admitting: Orthopedic Surgery

## 2019-04-20 ENCOUNTER — Other Ambulatory Visit: Payer: Self-pay

## 2019-04-20 ENCOUNTER — Ambulatory Visit (INDEPENDENT_AMBULATORY_CARE_PROVIDER_SITE_OTHER): Payer: 59 | Admitting: Orthopedic Surgery

## 2019-04-20 VITALS — Ht 66.0 in | Wt 201.0 lb

## 2019-04-20 DIAGNOSIS — M25872 Other specified joint disorders, left ankle and foot: Secondary | ICD-10-CM

## 2019-04-20 MED ORDER — METHYLPREDNISOLONE ACETATE 40 MG/ML IJ SUSP
40.0000 mg | INTRAMUSCULAR | Status: AC | PRN
  Administered 2019-04-20: 40 mg via INTRA_ARTICULAR

## 2019-04-20 MED ORDER — LIDOCAINE HCL 1 % IJ SOLN
0.5000 mL | INTRAMUSCULAR | Status: AC | PRN
  Administered 2019-04-20: .5 mL

## 2019-04-20 NOTE — Progress Notes (Signed)
Office Visit Note   Patient: Morgan Wolfe           Date of Birth: 09-23-1955           MRN: [id number] Visit Date: 04/20/2019              Requested by: Caren Macadam, MD [address] [place],  [place] [address] PCP: Caren Macadam, MD  Chief Complaint  Patient presents with  . Left Ankle - Routine Post Op    01/03/2019 left ankle arthro &deb      HPI: This is a pleasant 63 year old woman who is almost 3 months status post left ankle arthroscopy and debridement.  She states she has pain and swelling that occurs when she is on her ankle for more than 15 minutes.  She does have to self-pay for physical therapy so has not been doing PT.  She complains of pain in her ankle and and the back of her heel.  She takes an occasional Aleve  Assessment & Plan: Visit Diagnoses: No diagnosis found.  Plan: She will be out of work for 4 more weeks a self-directed Achilles stretching program eccentric strengthening and stretching was demonstrated to her today and she will do this several times a day if possible follow-up in 4 weeks  Follow-Up Instructions: No follow-ups on file.   Ortho Exam  Patient is alert, oriented, no adenopathy, well-dressed, normal affect, normal respiratory effort. Left ankle: Strong dorsalis pedis posterior tib pulses sensation is intact well-healed surgical portals.  Compartment soft nontender mild soft tissue swelling tender to palpation in the ankle joint and in the insertional of the Achilles Achilles is intact and she has dorsiflexion to about 5 to 10 degrees past neutral  Imaging: No results found. No images are attached to the encounter.  Labs: No results found for: HGBA1C, ESRSEDRATE, CRP, LABURIC, REPTSTATUS, GRAMSTAIN, CULT, LABORGA   Lab Results  Component Value Date   ALBUMIN 4.1 01/10/2018   ALBUMIN 3.7 01/04/2017   ALBUMIN 4.0 02/06/2015    No results found for: MG No results found for: VD25OH  No results found for:  PREALBUMIN CBC EXTENDED Latest Ref Rng & Units 01/10/2018 01/04/2017 02/06/2015  WBC 4.0 - 10.5 K/uL 7.8 9.3 8.5  RBC 3.87 - 5.11 Mil/uL 4.49 4.36 4.51  HGB 12.0 - 15.0 g/dL 12.6 12.4 12.8  HCT 36.0 - 46.0 % 37.2 37.2 38.3  PLT 150.0 - 400.0 K/uL 324.0 359.0 332.0  NEUTROABS 1.4 - 7.7 K/uL 2.9 3.8 3.6  LYMPHSABS 0.7 - 4.0 K/uL 4.4(H) 4.9(H) 4.1(H)     Body mass index is 32.44 kg/m.  Orders:  No orders of the defined types were placed in this encounter.  No orders of the defined types were placed in this encounter.    Procedures: Medium Joint Inj on 04/20/2019 8:55 AM Indications: pain and diagnostic evaluation Details: 22 G 1.5 in needle, anteromedial approach Medications: 0.5 mL lidocaine 1 %; 40 mg methylPREDNISolone acetate 40 MG/ML Outcome: tolerated well, no immediate complications Procedure, treatment alternatives, risks and benefits explained, specific risks discussed. Consent was given by the patient. Immediately prior to procedure a time out was called to verify the correct patient, procedure, equipment, support staff and site/side marked as required. Patient was prepped and draped in the usual sterile fashion.      Clinical Data: No additional findings.  ROS:  All other systems negative, except as noted in the HPI. Review of Systems  Objective: Vital Signs: Ht  5\' 6"  (1.676 m)   Wt 201 lb (91.2 kg)   BMI 32.44 kg/m   Specialty Comments:  No specialty comments available.  PMFS History: Patient Active Problem List   Diagnosis Date Noted  . Impingement syndrome of left ankle 01/11/2018  . Lateral epicondylitis (tennis elbow) 08/31/2016  . Essential hypertension 12/06/2006  . Allergic rhinitis 12/06/2006   Past Medical History:  Diagnosis Date  . ALLERGIC RHINITIS   . Hypertension   . PONV (postoperative nausea and vomiting)   . Right shoulder pain     Family History  Problem Relation Age of Onset  . Coronary artery disease Other        1st degress  relatives <50  . Hypertension Other   . Heart attack Brother 85  . Hyperlipidemia Other   . Glaucoma Mother   . Pancreatic cancer Father   . Brain cancer Sister   . Stroke Brother 6  . Colon cancer Neg Hx   . Stomach cancer Neg Hx     Past Surgical History:  Procedure Laterality Date  . ANKLE ARTHROSCOPY Left 01/03/2019   Procedure: LEFT ANKLE ARTHROSCOPY AND DEBRIDEMENT;  Surgeon: Newt Minion, MD;  Location: [facility];  Service: Orthopedics;  Laterality: Left;  . ANKLE SURGERY Left 2000  . ELBOW ARTHROSCOPY Right 2019  . TOE SURGERY Right 1998   bone spur  . VAGINAL HYSTERECTOMY  1980   per Dr. Harrington Challenger for fibroids; no cancer   Social History   Occupational History  . Not on file  Tobacco Use  . Smoking status: Never Smoker  . Smokeless tobacco: Never Used  Substance and Sexual Activity  . Alcohol use: No  . Drug use: No  . Sexual activity: Not on file

## 2019-04-26 ENCOUNTER — Other Ambulatory Visit: Payer: Self-pay | Admitting: Family Medicine

## 2019-05-18 ENCOUNTER — Telehealth: Payer: Self-pay | Admitting: Orthopedic Surgery

## 2019-05-18 ENCOUNTER — Encounter: Payer: Self-pay | Admitting: Orthopedic Surgery

## 2019-05-18 ENCOUNTER — Ambulatory Visit (INDEPENDENT_AMBULATORY_CARE_PROVIDER_SITE_OTHER): Payer: 59 | Admitting: Orthopedic Surgery

## 2019-05-18 ENCOUNTER — Other Ambulatory Visit: Payer: Self-pay

## 2019-05-18 VITALS — Ht 66.0 in | Wt 201.0 lb

## 2019-05-18 DIAGNOSIS — M25872 Other specified joint disorders, left ankle and foot: Secondary | ICD-10-CM

## 2019-05-18 NOTE — Telephone Encounter (Signed)
Last office note faxed to [facility]

## 2019-05-18 NOTE — Telephone Encounter (Signed)
Pt forgot to ask if you could fax a copy of her office visit notes for today to her disability insurance [employer].

## 2019-05-29 ENCOUNTER — Encounter: Payer: Self-pay | Admitting: Orthopedic Surgery

## 2019-05-29 NOTE — Progress Notes (Signed)
Office Visit Note   Patient: Morgan Wolfe           Date of Birth: 10/19/1955           MRN: [id number] Visit Date: 05/18/2019              Requested by: Caren Macadam, MD [address] [place],  [place] [address] PCP: Caren Macadam, MD  Chief Complaint  Patient presents with  . Left Ankle - Routine Post Op    01/03/2019 left ankle arthro &deb        HPI: Patient is a 64 year old woman who is 4 weeks status post left ankle arthroscopy and debridement patient complains of some sharp pain while walking injection did not help she states she still has pain but not as much as before surgery.  Assessment & Plan: Visit Diagnoses: No diagnosis found.  Plan: Recommended Voltaren gel recommended keeping her out of work for 4 more weeks and evaluate for return to work at follow-up.  Follow-Up Instructions: Return in about 4 weeks (around 06/15/2019).   Ortho Exam  Patient is alert, oriented, no adenopathy, well-dressed, normal affect, normal respiratory effort. Examination patient still has swelling around the ankle she has no redness no cellulitis no signs of infection there is no crepitation with range of motion.  Imaging: No results found. No images are attached to the encounter.  Labs: No results found for: HGBA1C, ESRSEDRATE, CRP, LABURIC, REPTSTATUS, GRAMSTAIN, CULT, LABORGA   Lab Results  Component Value Date   ALBUMIN 4.1 01/10/2018   ALBUMIN 3.7 01/04/2017   ALBUMIN 4.0 02/06/2015    No results found for: MG No results found for: VD25OH  No results found for: PREALBUMIN CBC EXTENDED Latest Ref Rng & Units 01/10/2018 01/04/2017 02/06/2015  WBC 4.0 - 10.5 K/uL 7.8 9.3 8.5  RBC 3.87 - 5.11 Mil/uL 4.49 4.36 4.51  HGB 12.0 - 15.0 g/dL 12.6 12.4 12.8  HCT 36.0 - 46.0 % 37.2 37.2 38.3  PLT 150.0 - 400.0 K/uL 324.0 359.0 332.0  NEUTROABS 1.4 - 7.7 K/uL 2.9 3.8 3.6  LYMPHSABS 0.7 - 4.0 K/uL 4.4(H) 4.9(H) 4.1(H)     Body mass index is 32.44  kg/m.  Orders:  No orders of the defined types were placed in this encounter.  No orders of the defined types were placed in this encounter.    Procedures: No procedures performed  Clinical Data: No additional findings.  ROS:  All other systems negative, except as noted in the HPI. Review of Systems  Objective: Vital Signs: Ht 5\' 6"  (1.676 m)   Wt 201 lb (91.2 kg)   BMI 32.44 kg/m   Specialty Comments:  No specialty comments available.  PMFS History: Patient Active Problem List   Diagnosis Date Noted  . Impingement syndrome of left ankle 01/11/2018  . Lateral epicondylitis (tennis elbow) 08/31/2016  . Essential hypertension 12/06/2006  . Allergic rhinitis 12/06/2006   Past Medical History:  Diagnosis Date  . ALLERGIC RHINITIS   . Hypertension   . PONV (postoperative nausea and vomiting)   . Right shoulder pain     Family History  Problem Relation Age of Onset  . Coronary artery disease Other        1st degress relatives <50  . Hypertension Other   . Heart attack Brother 62  . Hyperlipidemia Other   . Glaucoma Mother   . Pancreatic cancer Father   . Brain cancer Sister   . Stroke Brother 85  .  Colon cancer Neg Hx   . Stomach cancer Neg Hx     Past Surgical History:  Procedure Laterality Date  . ANKLE ARTHROSCOPY Left 01/03/2019   Procedure: LEFT ANKLE ARTHROSCOPY AND DEBRIDEMENT;  Surgeon: Newt Minion, MD;  Location: [facility];  Service: Orthopedics;  Laterality: Left;  . ANKLE SURGERY Left 2000  . ELBOW ARTHROSCOPY Right 2019  . TOE SURGERY Right 1998   bone spur  . VAGINAL HYSTERECTOMY  1980   per Dr. Harrington Challenger for fibroids; no cancer   Social History   Occupational History  . Not on file  Tobacco Use  . Smoking status: Never Smoker  . Smokeless tobacco: Never Used  Substance and Sexual Activity  . Alcohol use: No  . Drug use: No  . Sexual activity: Not on file

## 2019-06-15 ENCOUNTER — Ambulatory Visit (INDEPENDENT_AMBULATORY_CARE_PROVIDER_SITE_OTHER): Payer: 59 | Admitting: Orthopedic Surgery

## 2019-06-15 ENCOUNTER — Other Ambulatory Visit: Payer: Self-pay

## 2019-06-15 ENCOUNTER — Encounter: Payer: Self-pay | Admitting: Orthopedic Surgery

## 2019-06-15 ENCOUNTER — Ambulatory Visit: Payer: Self-pay

## 2019-06-15 DIAGNOSIS — M7672 Peroneal tendinitis, left leg: Secondary | ICD-10-CM | POA: Diagnosis not present

## 2019-06-15 DIAGNOSIS — M25872 Other specified joint disorders, left ankle and foot: Secondary | ICD-10-CM | POA: Diagnosis not present

## 2019-06-15 NOTE — Progress Notes (Signed)
Office Visit Note   Patient: Morgan Wolfe           Date of Birth: May 17, 1956           MRN: [id number] Visit Date: 06/15/2019              Requested by: Caren Macadam, MD [address] [place],  [place] [address] PCP: Caren Macadam, MD  Chief Complaint  Patient presents with  . Left Ankle - Routine Post Op, Follow-up    01/03/2019 left ankle arthro & deb      HPI: Patient is a 64 year old woman who is status post left ankle arthroscopy for impingement.  Patient states she still has some swelling primarily over the peroneal tendons.  Patient states she has pain with prolonged activities such as going out and shopping.  Patient does not feel like she can return to work.  Assessment & Plan: Visit Diagnoses:  1. Impingement syndrome of left ankle   2. Peroneal tendinitis of left lower extremity     Plan: We will place her in an ASO to stabilize the ankle and unload pressure from the peroneal tendons.  Reevaluate in 4 weeks note provided to be out of work for 4 weeks.  Follow-Up Instructions: Return in about 4 weeks (around 07/13/2019).   Ortho Exam  Patient is alert, oriented, no adenopathy, well-dressed, normal affect, normal respiratory effort. Examination patient has pronation valgus of both feet she can do a single limb heel raise without problems.  She has tenderness to palpation of the peroneal tendons on the left and there is a little bit of swelling on the left compared to the right.  She also has some mild tenderness to palpation over the sinus Tarsi and the subtalar joint and mild tenderness to palpation anteriorly over the ankle joint.  Imaging: XR Ankle Complete Left  Result Date: 06/15/2019 Three-view radiographs of the left ankle shows a congruent joint there is some beaking of the talonavicular joint most likely due to decreased motion of the subtalar joint  No images are attached to the encounter.  Labs: No results found for: HGBA1C,  ESRSEDRATE, CRP, LABURIC, REPTSTATUS, GRAMSTAIN, CULT, LABORGA   Lab Results  Component Value Date   ALBUMIN 4.1 01/10/2018   ALBUMIN 3.7 01/04/2017   ALBUMIN 4.0 02/06/2015    No results found for: MG No results found for: VD25OH  No results found for: PREALBUMIN CBC EXTENDED Latest Ref Rng & Units 01/10/2018 01/04/2017 02/06/2015  WBC 4.0 - 10.5 K/uL 7.8 9.3 8.5  RBC 3.87 - 5.11 Mil/uL 4.49 4.36 4.51  HGB 12.0 - 15.0 g/dL 12.6 12.4 12.8  HCT 36.0 - 46.0 % 37.2 37.2 38.3  PLT 150.0 - 400.0 K/uL 324.0 359.0 332.0  NEUTROABS 1.4 - 7.7 K/uL 2.9 3.8 3.6  LYMPHSABS 0.7 - 4.0 K/uL 4.4(H) 4.9(H) 4.1(H)     There is no height or weight on file to calculate BMI.  Orders:  Orders Placed This Encounter  Procedures  . XR Ankle Complete Left   No orders of the defined types were placed in this encounter.    Procedures: No procedures performed  Clinical Data: No additional findings.  ROS:  All other systems negative, except as noted in the HPI. Review of Systems  Objective: Vital Signs: There were no vitals taken for this visit.  Specialty Comments:  No specialty comments available.  PMFS History: Patient Active Problem List   Diagnosis Date Noted  . Impingement syndrome of  left ankle 01/11/2018  . Lateral epicondylitis (tennis elbow) 08/31/2016  . Essential hypertension 12/06/2006  . Allergic rhinitis 12/06/2006   Past Medical History:  Diagnosis Date  . ALLERGIC RHINITIS   . Hypertension   . PONV (postoperative nausea and vomiting)   . Right shoulder pain     Family History  Problem Relation Age of Onset  . Coronary artery disease Other        1st degress relatives <50  . Hypertension Other   . Heart attack Brother 64  . Hyperlipidemia Other   . Glaucoma Mother   . Pancreatic cancer Father   . Brain cancer Sister   . Stroke Brother 77  . Colon cancer Neg Hx   . Stomach cancer Neg Hx     Past Surgical History:  Procedure Laterality Date  . ANKLE  ARTHROSCOPY Left 01/03/2019   Procedure: LEFT ANKLE ARTHROSCOPY AND DEBRIDEMENT;  Surgeon: Newt Minion, MD;  Location: [facility];  Service: Orthopedics;  Laterality: Left;  . ANKLE SURGERY Left 2000  . ELBOW ARTHROSCOPY Right 2019  . TOE SURGERY Right 1998   bone spur  . VAGINAL HYSTERECTOMY  1980   per Dr. Harrington Challenger for fibroids; no cancer   Social History   Occupational History  . Not on file  Tobacco Use  . Smoking status: Never Smoker  . Smokeless tobacco: Never Used  Substance and Sexual Activity  . Alcohol use: No  . Drug use: No  . Sexual activity: Not on file

## 2019-06-21 ENCOUNTER — Telehealth: Payer: Self-pay | Admitting: Orthopedic Surgery

## 2019-06-21 NOTE — Telephone Encounter (Signed)
06/15/2019 ov note faxed to [place] disability [phone]/claim# [id number]

## 2019-07-13 ENCOUNTER — Encounter: Payer: Self-pay | Admitting: Orthopedic Surgery

## 2019-07-13 ENCOUNTER — Other Ambulatory Visit: Payer: Self-pay

## 2019-07-13 ENCOUNTER — Ambulatory Visit (INDEPENDENT_AMBULATORY_CARE_PROVIDER_SITE_OTHER): Payer: 59 | Admitting: Orthopedic Surgery

## 2019-07-13 VITALS — Ht 66.0 in | Wt 201.0 lb

## 2019-07-13 DIAGNOSIS — M25572 Pain in left ankle and joints of left foot: Secondary | ICD-10-CM

## 2019-07-13 DIAGNOSIS — M7672 Peroneal tendinitis, left leg: Secondary | ICD-10-CM

## 2019-07-13 NOTE — Progress Notes (Signed)
Office Visit Note   Patient: Morgan Wolfe           Date of Birth: 08/28/1955           MRN: [id number] Visit Date: 07/13/2019              Requested by: Caren Macadam, MD [address] [place],  [place] [address] PCP: Caren Macadam, MD  Chief Complaint  Patient presents with  . Left Ankle - Follow-up    01/03/19 Left ankle scope      HPI: Patient is a 64 year old woman who presents in follow-up for her left ankle.  She states she still has pain laterally over the sinus Tarsi and over the ankle joint.  She is 6 months out from the left ankle arthroscopy with debridement of synovitis for impingement she also had an osteochondral defect of the medial talar dome.  Patient states she does have swelling around the ankle with increased activities this resolves with rest.  Assessment & Plan: Visit Diagnoses:  1. Peroneal tendinitis of left lower extremity   2. Pain in left ankle and joints of left foot     Plan: Patient states she is unable to return to work due to her pain she is given a note to be out of work for 4 weeks we will set her up for an MRI scan to evaluate the peroneal tendons subtalar joint and the ankle joint.  Follow-Up Instructions: Return in about 2 weeks (around 07/27/2019).   Ortho Exam  Patient is alert, oriented, no adenopathy, well-dressed, normal affect, normal respiratory effort. Examination patient's left foot is neurovascular intact there is no hypersensitivity to light touch no dystrophic changes.  She is tender to palpation over the medial joint line of the left ankle she is tender to palpation over the sinus Tarsi she is tender to palpation over the peroneal tendons.  She does have pes planus with posterior tibial tendon insufficiency with hindfoot valgus and forefoot pronation.  Discussed possibility of injection for the tendinitis or subtalar pain patient states she does not want to proceed with an injection she has not wanted to  proceed with physical therapy.  Imaging: No results found. No images are attached to the encounter.  Labs: No results found for: HGBA1C, ESRSEDRATE, CRP, LABURIC, REPTSTATUS, GRAMSTAIN, CULT, LABORGA   Lab Results  Component Value Date   ALBUMIN 4.1 01/10/2018   ALBUMIN 3.7 01/04/2017   ALBUMIN 4.0 02/06/2015    No results found for: MG No results found for: VD25OH  No results found for: PREALBUMIN CBC EXTENDED Latest Ref Rng & Units 01/10/2018 01/04/2017 02/06/2015  WBC 4.0 - 10.5 K/uL 7.8 9.3 8.5  RBC 3.87 - 5.11 Mil/uL 4.49 4.36 4.51  HGB 12.0 - 15.0 g/dL 12.6 12.4 12.8  HCT 36.0 - 46.0 % 37.2 37.2 38.3  PLT 150.0 - 400.0 K/uL 324.0 359.0 332.0  NEUTROABS 1.4 - 7.7 K/uL 2.9 3.8 3.6  LYMPHSABS 0.7 - 4.0 K/uL 4.4(H) 4.9(H) 4.1(H)     Body mass index is 32.44 kg/m.  Orders:  Orders Placed This Encounter  Procedures  . MR Ankle Left w/o contrast   No orders of the defined types were placed in this encounter.    Procedures: No procedures performed  Clinical Data: No additional findings.  ROS:  All other systems negative, except as noted in the HPI. Review of Systems  Objective: Vital Signs: Ht 5\' 6"  (1.676 m)   Wt 201 lb (91.2 kg)  BMI 32.44 kg/m   Specialty Comments:  No specialty comments available.  PMFS History: Patient Active Problem List   Diagnosis Date Noted  . Impingement syndrome of left ankle 01/11/2018  . Lateral epicondylitis (tennis elbow) 08/31/2016  . Essential hypertension 12/06/2006  . Allergic rhinitis 12/06/2006   Past Medical History:  Diagnosis Date  . ALLERGIC RHINITIS   . Hypertension   . PONV (postoperative nausea and vomiting)   . Right shoulder pain     Family History  Problem Relation Age of Onset  . Coronary artery disease Other        1st degress relatives <50  . Hypertension Other   . Heart attack Brother 62  . Hyperlipidemia Other   . Glaucoma Mother   . Pancreatic cancer Father   . Brain cancer Sister    . Stroke Brother 33  . Colon cancer Neg Hx   . Stomach cancer Neg Hx     Past Surgical History:  Procedure Laterality Date  . ANKLE ARTHROSCOPY Left 01/03/2019   Procedure: LEFT ANKLE ARTHROSCOPY AND DEBRIDEMENT;  Surgeon: Newt Minion, MD;  Location: [facility];  Service: Orthopedics;  Laterality: Left;  . ANKLE SURGERY Left 2000  . ELBOW ARTHROSCOPY Right 2019  . TOE SURGERY Right 1998   bone spur  . VAGINAL HYSTERECTOMY  1980   per Dr. Harrington Challenger for fibroids; no cancer   Social History   Occupational History  . Not on file  Tobacco Use  . Smoking status: Never Smoker  . Smokeless tobacco: Never Used  Substance and Sexual Activity  . Alcohol use: No  . Drug use: No  . Sexual activity: Not on file

## 2019-07-28 ENCOUNTER — Telehealth: Payer: Self-pay | Admitting: Family Medicine

## 2019-07-28 ENCOUNTER — Other Ambulatory Visit: Payer: Self-pay | Admitting: Family Medicine

## 2019-07-28 DIAGNOSIS — J3089 Other allergic rhinitis: Secondary | ICD-10-CM

## 2019-07-28 MED ORDER — FLUTICASONE PROPIONATE 50 MCG/ACT NA SUSP: 2.0000 | Freq: Every day | NASAL | 2 refills | Status: DC

## 2019-07-28 NOTE — Telephone Encounter (Signed)
Medication Refill: Flonase Hydrocodone-homatropine Tessalon  Pharmacy: [facility]: [phone]   Pt is scheduled for a CPE on July 7th which is [facility] next availability for a CPE

## 2019-07-28 NOTE — Telephone Encounter (Signed)
Is she having cough? Tessalon perles and hycodan are not regular rx for her. Please get more info.   If she wants physical sooner; we can put in any 30 min time slot.

## 2019-07-28 NOTE — Telephone Encounter (Signed)
Spoke with the Morgan Wolfe and informed her of the message below.  Morgan Wolfe stated she had a cough and it went away and this is what Dr Burnice Logan always gave her.  Offered earlier appt and the Morgan Wolfe stated she will await CPE appt in July.

## 2019-08-07 ENCOUNTER — Ambulatory Visit: Payer: 59 | Admitting: Orthopedic Surgery

## 2019-08-08 ENCOUNTER — Other Ambulatory Visit: Payer: Self-pay

## 2019-08-08 ENCOUNTER — Ambulatory Visit
Admission: RE | Admit: 2019-08-08 | Discharge: 2019-08-08 | Disposition: A | Discharge status: Home / Self Care | Payer: 59 | Source: Ambulatory Visit | Attending: Physician Assistant | Admitting: Physician Assistant

## 2019-08-08 DIAGNOSIS — M7672 Peroneal tendinitis, left leg: Secondary | ICD-10-CM

## 2019-08-15 ENCOUNTER — Ambulatory Visit (INDEPENDENT_AMBULATORY_CARE_PROVIDER_SITE_OTHER): Payer: 59 | Admitting: Orthopedic Surgery

## 2019-08-15 ENCOUNTER — Encounter: Payer: Self-pay | Admitting: Orthopedic Surgery

## 2019-08-15 ENCOUNTER — Other Ambulatory Visit: Payer: Self-pay

## 2019-08-15 VITALS — Ht 66.0 in | Wt 201.0 lb

## 2019-08-15 DIAGNOSIS — M7672 Peroneal tendinitis, left leg: Secondary | ICD-10-CM

## 2019-08-15 NOTE — Progress Notes (Signed)
Office Visit Note   Patient: Morgan Wolfe           Date of Birth: [birth year]           MRN: [id number] Visit Date: 08/15/2019              Requested by: Caren Macadam, MD [address] [place],   [address] PCP: Caren Macadam, MD  Chief Complaint  Patient presents with  . Left Ankle - Follow-up    MRI review       HPI: Patient is a 64 year old woman who presents in follow-up status post her MRI scan.  Patient states that her pain primarily at this time is over the peroneal tendons.  She states it starts from the inferior aspect of the fibula and extends proximally.  Assessment & Plan: Visit Diagnoses:  1. Peroneal tendinitis of left lower leg     Plan: Discussed for nonoperative treatment options would be to use a sole . Orthotic to decrease the planovalgus impingement over the peroneal tendons.  Recommended the ASO to also provide additional support.  Discussed that if she fails conservative treatment debridement of the peroneal tendons and possible reinforcement of the peroneus brevis with the peroneus longus may be necessary. Follow-Up Instructions: Return if symptoms worsen or fail to improve.   Ortho Exam  Patient is alert, oriented, no adenopathy, well-dressed, normal affect, normal respiratory effort.  Patient has a good dorsalis pedis pulse she has a planovalgus foot with weightbearing patient does not reconstitute hindfoot varus with attempted single limb heel raise.  She is point tender to palpation along the peroneal tendons posterior to the fibula.  Review of the MRI scan does show partial tearing of the peroneus brevis tendon longitudinally patient also has some edema in the distal tip of the lateral malleolus consistent with impingement from the planovalgus foot deformity.   Imaging: No results found. No images are attached to the encounter.  Labs: No results found for: HGBA1C, ESRSEDRATE, CRP, LABURIC, REPTSTATUS, GRAMSTAIN, CULT,  LABORGA   Lab Results  Component Value Date   ALBUMIN 4.1 01/10/2018   ALBUMIN 3.7 01/04/2017   ALBUMIN 4.0 02/06/2015    No results found for: MG No results found for: VD25OH  No results found for: PREALBUMIN CBC EXTENDED Latest Ref Rng & Units 01/10/2018 01/04/2017 02/06/2015  WBC 4.0 - 10.5 K/uL 7.8 9.3 8.5  RBC 3.87 - 5.11 Mil/uL 4.49 4.36 4.51  HGB 12.0 - 15.0 g/dL 12.6 12.4 12.8  HCT 36.0 - 46.0 % 37.2 37.2 38.3  PLT 150.0 - 400.0 K/uL 324.0 359.0 332.0  NEUTROABS 1.4 - 7.7 K/uL 2.9 3.8 3.6  LYMPHSABS 0.7 - 4.0 K/uL 4.4(H) 4.9(H) 4.1(H)     Body mass index is 32.44 kg/m.  Orders:  No orders of the defined types were placed in this encounter.  No orders of the defined types were placed in this encounter.    Procedures: No procedures performed  Clinical Data: No additional findings.  ROS:  All other systems negative, except as noted in the HPI. Review of Systems  Objective: Vital Signs: Ht 5\' 6"  (1.676 m)   Wt 201 lb (91.2 kg)   BMI 32.44 kg/m   Specialty Comments:  No specialty comments available.  PMFS History: Patient Active Problem List   Diagnosis Date Noted  . Impingement syndrome of left ankle 01/11/2018  . Lateral epicondylitis (tennis elbow) 08/31/2016  . Essential hypertension 12/06/2006  . Allergic rhinitis 12/06/2006  Past Medical History:  Diagnosis Date  . ALLERGIC RHINITIS   . Hypertension   . PONV (postoperative nausea and vomiting)   . Right shoulder pain     Family History  Problem Relation Age of Onset  . Coronary artery disease Other        1st degress relatives <50  . Hypertension Other   . Heart attack Brother 12  . Hyperlipidemia Other   . Glaucoma Mother   . Pancreatic cancer Father   . Brain cancer Sister   . Stroke Brother 31  . Colon cancer Neg Hx   . Stomach cancer Neg Hx     Past Surgical History:  Procedure Laterality Date  . ANKLE ARTHROSCOPY Left 01/03/2019   Procedure: LEFT ANKLE ARTHROSCOPY AND  DEBRIDEMENT;  Surgeon: Newt Minion, MD;  Location: [facility];  Service: Orthopedics;  Laterality: Left;  . ANKLE SURGERY Left 2000  . ELBOW ARTHROSCOPY Right 2019  . TOE SURGERY Right 1998   bone spur  . VAGINAL HYSTERECTOMY  1980   per Dr. Harrington Challenger for fibroids; no cancer   Social History   Occupational History  . Not on file  Tobacco Use  . Smoking status: Never Smoker  . Smokeless tobacco: Never Used  Substance and Sexual Activity  . Alcohol use: No  . Drug use: No  . Sexual activity: Not on file

## 2019-08-15 NOTE — Telephone Encounter (Signed)
disregard

## 2019-09-04 ENCOUNTER — Other Ambulatory Visit: Payer: Self-pay | Admitting: Family Medicine

## 2019-09-04 DIAGNOSIS — J3089 Other allergic rhinitis: Secondary | ICD-10-CM

## 2019-09-04 DIAGNOSIS — I10 Essential (primary) hypertension: Secondary | ICD-10-CM

## 2019-09-07 ENCOUNTER — Other Ambulatory Visit: Payer: Self-pay | Admitting: Family Medicine

## 2019-09-07 DIAGNOSIS — I10 Essential (primary) hypertension: Secondary | ICD-10-CM

## 2019-09-07 DIAGNOSIS — J3089 Other allergic rhinitis: Secondary | ICD-10-CM

## 2019-09-11 ENCOUNTER — Telehealth: Payer: Self-pay | Admitting: Orthopedic Surgery

## 2019-09-11 NOTE — Telephone Encounter (Signed)
Patient called.   She didn't disclose why but she is requesting a call back   Call back: [phone]

## 2019-09-11 NOTE — Telephone Encounter (Signed)
Pt requested that her office note from her last visit on 08/15/19 to be faxed to [facility]. Fax # is [phone] and claim # [id number]

## 2019-10-08 ENCOUNTER — Other Ambulatory Visit: Payer: Self-pay | Admitting: Family Medicine

## 2019-10-08 DIAGNOSIS — I10 Essential (primary) hypertension: Secondary | ICD-10-CM

## 2019-10-08 DIAGNOSIS — J3089 Other allergic rhinitis: Secondary | ICD-10-CM

## 2019-10-11 ENCOUNTER — Telehealth: Payer: Self-pay | Admitting: Orthopedic Surgery

## 2019-10-11 NOTE — Telephone Encounter (Signed)
Received call from [place] with [facility] she asked if the MRI report can be faxed to her. The fax# is [phone]   The ph# if [phone]

## 2019-10-11 NOTE — Telephone Encounter (Signed)
Faxed MRI report as requested to number below.

## 2019-10-13 ENCOUNTER — Encounter: Payer: Self-pay | Admitting: Family Medicine

## 2019-10-13 ENCOUNTER — Telehealth (INDEPENDENT_AMBULATORY_CARE_PROVIDER_SITE_OTHER): Payer: 59 | Admitting: Family Medicine

## 2019-10-13 VITALS — BP 148/86 | HR 63 | Temp 98.2°F | Wt 198.0 lb

## 2019-10-13 DIAGNOSIS — R319 Hematuria, unspecified: Secondary | ICD-10-CM | POA: Diagnosis not present

## 2019-10-13 DIAGNOSIS — R1030 Lower abdominal pain, unspecified: Secondary | ICD-10-CM | POA: Diagnosis not present

## 2019-10-13 MED ORDER — PREDNISONE 20 MG PO TABS: ORAL_TABLET | ORAL | 0 refills | Status: DC

## 2019-10-13 NOTE — Progress Notes (Signed)
Virtual Visit via Video Note Patient unable to connect via video. Visit completed by telephone.  I connected with Morgan Wolfe on 10/13/19 at  2:30 PM EDT by a video enabled telemedicine application and verified that I am speaking with the correct person using two identifiers.  Location patient: home Location provider:work or home office Persons participating in the virtual visit: patient, provider  I discussed the limitations of evaluation and management by telemedicine and the availability of in person appointments. The patient expressed understanding and agreed to proceed.   Morgan Wolfe DOB: June 03, 1955 Encounter date: 10/13/2019  This is a 64 y.o. female who presents with Chief Complaint  Patient presents with  . Abdominal Pain    patient complains of left-sided abdominal pain x3 weeks, had a video visit recently and advised to go to an urgent care, seen at an urgent care yesterday and diagnosed with hematuria, given Naproxen and advised to follow up with PCP  . Back Pain    complains of pain when walking and while lying down, no known injury, question if ankle pain is causing back pain    History of present illness: Lower part of abd, on left hurts and lower part of back. Really painful with walking, sitting down, getting up. Pain improves with walking. When she goes to bed at night has to turn in bed so it doesn't hurt. Has been going on for 3 weeks. Was slight at the beginning, but hasn't gone away. Has to hold stomach with walking.   No fevers.   Did zoom call Monday and did urgent care yesterday. Had hematuria. Advised to get with primary doc. No discomfort with urination. No vaginal discharge.   Was having 2-3 BM/day and this has been consistent for her. Not had blood in the stools. It is sore to push down on abdomen just with standing, but not with sitting. Pain across lower back. No tenderness with lateral hip pressure. Twisting from right to left bothers back/lower abd.  No swelling in groin area. Hurts to arch backward - lower back and then lower belly/upper leg.   No known injury.   Has been taking advil. Triage nurse told her to stop this. Also using heating pad, which helps with lower back. Still hurts when getting up out of bed. No nausea or vomiting. Eating has been normal.     Allergies  Allergen Reactions  . Augmentin [Amoxicillin-Pot Clavulanate] Nausea And Vomiting    Diarrhea   . Sulfonamide Derivatives Itching   Current Meds  Medication Sig  . estrogens, conjugated, (PREMARIN) 0.625 MG tablet TAKE 1 TABLET BY MOUTH  DAILY FOR 21 DAYS THEN DO  NOT TAKE FOR 7 DAYS  . fluticasone (FLONASE) 50 MCG/ACT nasal spray Place 2 sprays into both nostrils daily.  . meclizine (ANTIVERT) 25 MG tablet Take 1 tablet (25 mg total) by mouth 3 (three) times daily as needed for dizziness.  . naproxen (NAPROSYN) 500 MG tablet Take 500 mg by mouth 2 (two) times daily with a meal.  . potassium chloride SA (K-DUR,KLOR-CON) 20 MEQ tablet Take 1 tablet (20 mEq total) by mouth daily.  Marland Kitchen triamterene-hydrochlorothiazide (DYAZIDE) 37.5-25 MG capsule TAKE 1 CAPSULE BY MOUTH EVERY DAY    Review of Systems  Constitutional: Negative for chills, fatigue and fever.  Respiratory: Negative for cough, chest tightness, shortness of breath and wheezing.   Cardiovascular: Negative for chest pain, palpitations and leg swelling.  Gastrointestinal: Positive for abdominal pain. Negative for abdominal distention, blood in  stool, constipation, diarrhea, nausea and vomiting.  Genitourinary: Negative for decreased urine volume, difficulty urinating, dysuria, flank pain, frequency, urgency, vaginal bleeding and vaginal discharge. Hematuria: microscopic; noted just at urgent care.  Musculoskeletal: Positive for back pain.    Objective:  BP (!) 148/86 Comment: taken at urgent care on 5/27-jaf  Pulse 63   Temp 98.2 F (36.8 C)   Wt 198 lb (89.8 kg)   BMI 31.96 kg/m   Weight: 198 lb  (89.8 kg)   BP Readings from Last 3 Encounters:  10/13/19 (!) 148/86  01/03/19 110/66  07/20/18 118/60   Wt Readings from Last 3 Encounters:  10/13/19 198 lb (89.8 kg)  08/15/19 201 lb (91.2 kg)  07/13/19 201 lb (91.2 kg)    EXAM:  GENERAL: sounds alert, oriented, and well and in no acute distress LUNGS: breathing sounds comfortable on phone, no shortness of breath PSYCH/NEURO: pleasant and cooperative, no obvious depression or anxiety, speech and thought processing grossly intact   Assessment/Plan  1. Hematuria, unspecified type Patient is not having any symptoms of urinary tract infection.  When she comes back to the office we will recheck urine.  She is not aware of having UAs in the past so is not aware of whether or not she has had hematuria previously.  There are no symptoms suggestive of urinary tract stone.  2. Lower abdominal pain On further questioning, the pain really sounds musculoskeletal since it does improve with movement and she is unable to reproduce it with palpation.  She has been taking anti-inflammatory without any improvement.  I am limited since this is a telephone visit, but we discussed options including seeing sports medicine, and she would like to try available treatments no with follow-up in the office with me.  Going to send in a short burst of prednisone to see if we can calm down inflammation, and then we will determine next steps pending follow-up visit.  I have asked her to be evaluated if any worsening of pain.     I discussed the assessment and treatment plan with the patient. The patient was provided an opportunity to ask questions and all were answered. The patient agreed with the plan and demonstrated an understanding of the instructions.   The patient was advised to call back or seek an in-person evaluation if the symptoms worsen or if the condition fails to improve as anticipated.  I provided 25 minutes of non-face-to-face time during this  encounter.   Micheline Rough, MD

## 2019-10-17 ENCOUNTER — Telehealth: Payer: Self-pay | Admitting: *Deleted

## 2019-10-17 NOTE — Telephone Encounter (Signed)
Spoke with the pt and scheduled an appt for 6/4 to arrive at 11am.

## 2019-10-17 NOTE — Telephone Encounter (Signed)
-----   Message from Caren Macadam, MD sent at 10/13/2019  3:21 PM EDT ----- Please set up follow up for patient in 7-10 days in office

## 2019-10-19 ENCOUNTER — Other Ambulatory Visit: Payer: Self-pay

## 2019-10-20 ENCOUNTER — Encounter: Payer: Self-pay | Admitting: Family Medicine

## 2019-10-20 ENCOUNTER — Telehealth: Payer: Self-pay

## 2019-10-20 ENCOUNTER — Ambulatory Visit (INDEPENDENT_AMBULATORY_CARE_PROVIDER_SITE_OTHER): Payer: 59 | Admitting: Family Medicine

## 2019-10-20 VITALS — BP 130/70 | HR 88 | Temp 97.5°F | Ht 66.0 in | Wt 198.8 lb

## 2019-10-20 DIAGNOSIS — M25512 Pain in left shoulder: Secondary | ICD-10-CM | POA: Diagnosis not present

## 2019-10-20 DIAGNOSIS — M7062 Trochanteric bursitis, left hip: Secondary | ICD-10-CM

## 2019-10-20 DIAGNOSIS — M25511 Pain in right shoulder: Secondary | ICD-10-CM

## 2019-10-20 DIAGNOSIS — G8929 Other chronic pain: Secondary | ICD-10-CM | POA: Diagnosis not present

## 2019-10-20 DIAGNOSIS — I10 Essential (primary) hypertension: Secondary | ICD-10-CM | POA: Diagnosis not present

## 2019-10-20 DIAGNOSIS — R319 Hematuria, unspecified: Secondary | ICD-10-CM

## 2019-10-20 DIAGNOSIS — R1032 Left lower quadrant pain: Secondary | ICD-10-CM

## 2019-10-20 LAB — COMPREHENSIVE METABOLIC PANEL
ALT: 9 U/L (ref 0–35)
AST: 10 U/L (ref 0–37)
Albumin: 3.9 g/dL (ref 3.5–5.2)
Alkaline Phosphatase: 69 U/L (ref 39–117)
BUN: 19 mg/dL (ref 6–23)
CO2: 31 mEq/L (ref 19–32)
Calcium: 9.6 mg/dL (ref 8.4–10.5)
Chloride: 97 mEq/L (ref 96–112)
Creatinine, Ser: 0.89 mg/dL (ref 0.40–1.20)
GFR: 77.82 mL/min (ref >60.00)
Glucose, Bld: 96 mg/dL (ref 70–99)
Potassium: 3.2 mEq/L — ABNORMAL LOW (ref 3.5–5.1)
Sodium: 137 mEq/L (ref 135–145)
Total Bilirubin: 0.3 mg/dL (ref 0.2–1.2)
Total Protein: 6.9 g/dL (ref 6.0–8.3)

## 2019-10-20 LAB — CBC WITH DIFFERENTIAL/PLATELET
Basophils Absolute: 0 10*3/uL (ref 0.0–0.1)
Basophils Relative: 0.2 % (ref 0.0–3.0)
Eosinophils Absolute: 0 10*3/uL (ref 0.0–0.7)
Eosinophils Relative: 0.1 % (ref 0.0–5.0)
HCT: 35.5 % — ABNORMAL LOW (ref 36.0–46.0)
Hemoglobin: 11.9 g/dL — ABNORMAL LOW (ref 12.0–15.0)
Lymphocytes Relative: 26 % (ref 12.0–46.0)
Lymphs Abs: 4.7 10*3/uL — ABNORMAL HIGH (ref 0.7–4.0)
MCHC: 33.6 g/dL (ref 30.0–36.0)
MCV: 84.7 fl (ref 78.0–100.0)
Monocytes Absolute: 0.9 10*3/uL (ref 0.1–1.0)
Monocytes Relative: 5.1 % (ref 3.0–12.0)
Neutro Abs: 12.5 10*3/uL — ABNORMAL HIGH (ref 1.4–7.7)
Neutrophils Relative %: 68.6 % (ref 43.0–77.0)
Platelets: 402 10*3/uL — ABNORMAL HIGH (ref 150.0–400.0)
RBC: 4.19 Mil/uL (ref 3.87–5.11)
RDW: 13.9 % (ref 11.5–15.5)
WBC: 18.2 10*3/uL (ref 4.0–10.5)

## 2019-10-20 LAB — POC URINALSYSI DIPSTICK (AUTOMATED)
Bilirubin, UA: NEGATIVE
Glucose, UA: NEGATIVE
Ketones, UA: NEGATIVE
Nitrite, UA: NEGATIVE
Protein, UA: NEGATIVE
Spec Grav, UA: 1.02 (ref 1.010–1.025)
Urobilinogen, UA: 0.2 E.U./dL
pH, UA: 6 (ref 5.0–8.0)

## 2019-10-20 LAB — SEDIMENTATION RATE: Sed Rate: 33 mm/hr — ABNORMAL HIGH (ref 0–30)

## 2019-10-20 LAB — C-REACTIVE PROTEIN: CRP: 1.4 mg/dL (ref 0.5–20.0)

## 2019-10-20 NOTE — Telephone Encounter (Signed)
Hyman Bible with [facility] called in with a critical lab. WB count is at 18.2

## 2019-10-20 NOTE — Patient Instructions (Addendum)
Great toe pose - using belt on ball of foot  While lying on back to stretch hamstring - take central with both hands, then to each side. Repeat on other side.   Runners stretch with pelvic tilt  Figure four with leg crossed  Work on above stretches twice daily.    Hip Bursitis  Hip bursitis is inflammation of a fluid-filled sac (bursa) in the hip joint. The bursa prevents the bones in the hip joint from rubbing against each other. Hip bursitis can cause mild to moderate pain, and symptoms often come and go over time. What are the causes? This condition may be caused by:  Injury to the hip.  Overuse of the muscles that surround the hip joint.  Previous injury or surgery of the hip.  Arthritis or gout.  Diabetes.  Thyroid disease.  Infection. In some cases, the cause may not be known. What are the signs or symptoms? Symptoms of this condition include:  Mild or moderate pain in the hip area. Pain may get worse with movement.  Tenderness and swelling of the hip, especially on the outer side of the hip.  In rare cases, the bursa may become infected. This may cause a fever, as well as warmth and redness in the area. Symptoms may come and go. How is this diagnosed? This condition may be diagnosed based on:  A physical exam.  Your medical history.  X-rays.  Removal of fluid from your inflamed bursa for testing (biopsy). You may be sent to a health care provider who specializes in bone diseases (orthopedist) or a provider who specializes in joint inflammation (rheumatologist). How is this treated? This condition is treated by resting, icing, applying pressure (compression), and raising (elevating) the injured area. This is called RICE treatment. In some cases, this may be enough to make your symptoms go away. Treatment may also include:  Using crutches.  Draining fluid out of the bursa to help relieve swelling.  Injecting medicine that helps to reduce inflammation  (cortisone).  Additional medicines if the bursa is infected. Follow these instructions at home: Managing pain, stiffness, and swelling   If directed, put ice on the painful area. ? Put ice in a plastic bag. ? Place a towel between your skin and the bag. ? Leave the ice on for 20 minutes, 2-3 times a day. ? Raise (elevate) your hip above the level of your heart as much as you can without pain. To do this, try putting a pillow under your hips while you lie down. Activity  Return to your normal activities as told by your health care provider. Ask your health care provider what activities are safe for you.  Rest and protect your hip as much as possible until your pain and swelling get better. General instructions  Take over-the-counter and prescription medicines only as told by your health care provider.  Wear compression wraps only as told by your health care provider.  Do not use your hip to support your body weight until your health care provider says that you can. Use crutches as told by your health care provider.  Gently massage and stretch your injured area as often as is comfortable.  Keep all follow-up visits as told by your health care provider. This is important. How is this prevented?  Exercise regularly, as told by your health care provider.  Warm up and stretch before being active.  Cool down and stretch after being active.  If an activity irritates your hip or causes  pain, avoid the activity as much as possible.  Avoid sitting down for long periods at a time. Contact a health care provider if you:  Have a fever.  Develop new symptoms.  Have difficulty walking or doing everyday activities.  Have pain that gets worse or does not get better with medicine.  Develop red skin or a feeling of warmth in your hip area. Get help right away if you:  Cannot move your hip.  Have severe pain. Summary  Hip bursitis is inflammation of a fluid-filled sac (bursa) in  the hip joint.  Hip bursitis can cause mild to moderate pain, and symptoms often come and go over time.  This condition is treated with rest, ice, compression, elevation, and medicines. This information is not intended to replace advice given to you by your health care provider. Make sure you discuss any questions you have with your health care provider. Document Revised: 01/10/2018 Document Reviewed: 01/10/2018 Elsevier Patient Education  [place].

## 2019-10-20 NOTE — Progress Notes (Signed)
Morgan Wolfe DOB: 1955/05/26 Encounter date: 10/20/2019  This is a 64 y.o. female who presents with Chief Complaint  Patient presents with  . Follow-up    History of present illness: Previous virtual visit on 5/28 for follow-up with left-sided abdominal pain and hematuria.  Pain seemed to be more musculoskeletal in nature, worsened with changes in position.  Patient put on prednisone course and asked to follow-up in 1 week to recheck urine and symptoms.  No previous UAs for comparison, so not sure if she has any hematuria at baseline.  She is better, but not better. Not as bad as it was when she talked with me. But still left lower abd and left lower back. Better with prednisone, but not "cured".   Bowels are ok.   Thinks bending over makes it worse in back. Nothing changes anterior pain. Rates pain currently at 5.    Has been frustrated with dealing with left ankle issues.  Had surgery, but postoperatively was found to have further need for repair.  Allergies  Allergen Reactions  . Augmentin [Amoxicillin-Pot Clavulanate] Nausea And Vomiting    Diarrhea   . Sulfonamide Derivatives Itching   Current Meds  Medication Sig  . estrogens, conjugated, (PREMARIN) 0.625 MG tablet TAKE 1 TABLET BY MOUTH  DAILY FOR 21 DAYS THEN DO  NOT TAKE FOR 7 DAYS  . fluticasone (FLONASE) 50 MCG/ACT nasal spray Place 2 sprays into both nostrils daily.  . meclizine (ANTIVERT) 25 MG tablet Take 1 tablet (25 mg total) by mouth 3 (three) times daily as needed for dizziness.  . naproxen (NAPROSYN) 500 MG tablet Take 500 mg by mouth 2 (two) times daily with a meal.  . potassium chloride SA (K-DUR,KLOR-CON) 20 MEQ tablet Take 1 tablet (20 mEq total) by mouth daily.  . predniSONE (DELTASONE) 20 MG tablet Take 3 tabs PO days 1, 2, then 2 tabs PO days 3,4,5; then 1 tab PO days 6,7 then 1/2 tab PO days 8,9  . triamterene-hydrochlorothiazide (DYAZIDE) 37.5-25 MG capsule TAKE 1 CAPSULE BY MOUTH EVERY DAY     Review of Systems  Constitutional: Negative for chills, fatigue and fever.  Respiratory: Negative for cough, chest tightness, shortness of breath and wheezing.   Cardiovascular: Negative for chest pain, palpitations and leg swelling.  Gastrointestinal: Positive for abdominal pain. Negative for blood in stool, constipation and diarrhea.  Genitourinary: Negative for difficulty urinating, dysuria, frequency, hematuria, vaginal discharge and vaginal pain.  Musculoskeletal: Positive for arthralgias and back pain. Negative for joint swelling.    Objective:  BP 130/70 (BP Location: Left Arm, Patient Position: Sitting, Cuff Size: Large)   Pulse 88   Temp (!) 97.5 F (36.4 C) (Temporal)   Ht 5\' 6"  (1.676 m)   Wt 198 lb 12.8 oz (90.2 kg)   SpO2 98%   BMI 32.09 kg/m   Weight: 198 lb 12.8 oz (90.2 kg)   BP Readings from Last 3 Encounters:  10/20/19 130/70  10/13/19 (!) 148/86  01/03/19 110/66   Wt Readings from Last 3 Encounters:  10/20/19 198 lb 12.8 oz (90.2 kg)  10/13/19 198 lb (89.8 kg)  08/15/19 201 lb (91.2 kg)    Physical Exam Constitutional:      General: She is not in acute distress.    Appearance: She is well-developed.  Cardiovascular:     Rate and Rhythm: Normal rate and regular rhythm.     Heart sounds: Murmur present. Systolic murmur present with a grade of 2/6. No friction rub.  Pulmonary:     Effort: Pulmonary effort is normal. No respiratory distress.     Breath sounds: Normal breath sounds. No wheezing or rales.  Abdominal:     General: Abdomen is flat. Bowel sounds are normal.     Palpations: Abdomen is soft.     Tenderness: There is abdominal tenderness. There is no right CVA tenderness, left CVA tenderness, guarding or rebound. Negative signs include Murphy's sign and McBurney's sign.     Hernia: There is no hernia in the umbilical area.     Comments: She is really only tender with palpation right over the left inguinal area.  Musculoskeletal:     Right  lower leg: No edema.     Left lower leg: No edema.     Comments: She is very tender over left trochanteric bursa.  She has increased lower back tenderness with extension, flexion, but range of motion is not greatly restricted.  No pain within hip joints with flexion or external or internal rotation.  She does have tenderness over IT band.  Neurological:     Mental Status: She is alert and oriented to person, place, and time.  Psychiatric:        Behavior: Behavior normal.     Assessment/Plan  1. Hematuria, unspecified type This was confirmed today on UA.  We will send for culture and consider further evaluation pending this result.  She is not having any UTI symptoms presently. - POCT Urinalysis Dipstick (Automated) - Culture, Urine  2. Trochanteric bursitis, left hip We discussed option of injection today.  She is tired of having injections/procedures, so she would like to work on stretches, icing and will consider injection if not improving.  We discussed stretching and specific terms today including great toe stretch, specific hip and gluteal stretches which I think will help with her inguinal discomfort.  3. Essential hypertension Blood pressure is well controlled.  Continue current medication. - CBC with Differential/Platelet - Comprehensive metabolic panel  4. Chronic pain of both shoulders Previous diagnosis.  I do think it would be helpful for [place] to check inflammatory markers due to ongoing pain. - C-reactive protein - Sedimentation rate - ANA  5.  Left inguinal pain.  This improved 50% after prednisone treatment.  I have given her stretches and recommendations to help with musculoskeletal inflammation and will check in with her after we get lab results.  We discussed giving stretches and exercises a couple of weeks to see if she gets some improvement.  If not, we discussed a trochanteric injection as well as sports medicine.  She is tired of seeing multiple providers so would  prefer to take a conservative approach to treatment.  Advised her to let me know if any worsening of discomfort.    Return for pending bloodwork.  Micheline Rough, MD

## 2019-10-20 NOTE — Telephone Encounter (Signed)
This is a patient of Dr. Ethlyn Gallery

## 2019-10-20 NOTE — Telephone Encounter (Signed)
We have had her on steroids, so I suspect this has contributed, but please advise her that if any worsening of pain, I want her to go to ER for evaluation. I will review other labs and touch base with her on Monday to check in and with regards to these results.

## 2019-10-20 NOTE — Telephone Encounter (Signed)
Spoke with the pt and informed her of the message below.   

## 2019-10-23 ENCOUNTER — Other Ambulatory Visit: Payer: Self-pay | Admitting: Family Medicine

## 2019-10-23 LAB — URINE CULTURE
MICRO NUMBER:: 10554496
SPECIMEN QUALITY:: ADEQUATE

## 2019-10-23 LAB — ANTI-NUCLEAR AB-TITER (ANA TITER): ANA Titer 1: 1:80 {titer} — ABNORMAL HIGH

## 2019-10-23 LAB — ANA: Anti Nuclear Antibody (ANA): POSITIVE — AB

## 2019-10-23 MED ORDER — CIPROFLOXACIN HCL 500 MG PO TABS
500.0000 mg | ORAL_TABLET | Freq: Two times a day (BID) | ORAL | 0 refills | Status: AC
Start: 2019-10-23 — End: 2019-10-28

## 2019-10-27 ENCOUNTER — Other Ambulatory Visit: Payer: Self-pay | Admitting: Family Medicine

## 2019-10-27 ENCOUNTER — Telehealth: Payer: Self-pay | Admitting: Family Medicine

## 2019-10-27 DIAGNOSIS — J3089 Other allergic rhinitis: Secondary | ICD-10-CM

## 2019-10-27 NOTE — Telephone Encounter (Signed)
Pt is calling to give an update on how she is doing on Cipro 500 mg.  Pt is doing better on medication and is in a little bit of pain when she walks. Thanks

## 2019-10-30 ENCOUNTER — Other Ambulatory Visit: Payer: Self-pay | Admitting: Family Medicine

## 2019-10-30 ENCOUNTER — Other Ambulatory Visit (INDEPENDENT_AMBULATORY_CARE_PROVIDER_SITE_OTHER): Payer: 59

## 2019-10-30 DIAGNOSIS — R319 Hematuria, unspecified: Secondary | ICD-10-CM | POA: Diagnosis not present

## 2019-10-30 LAB — URINALYSIS
Bilirubin Urine: NEGATIVE
Ketones, ur: NEGATIVE
Leukocytes,Ua: NEGATIVE
Nitrite: NEGATIVE
Specific Gravity, Urine: 1.015 (ref 1.000–1.030)
Total Protein, Urine: NEGATIVE
Urine Glucose: NEGATIVE
Urobilinogen, UA: 0.2 (ref 0.0–1.0)
pH: 7.5 (ref 5.0–8.0)

## 2019-10-30 NOTE — Telephone Encounter (Signed)
Spoke to the pt and informed her of below message.  Instructed her to go to the [place] lab.

## 2019-10-30 NOTE — Telephone Encounter (Signed)
Can you have her please drop off another urine for culture to make sure infection has cleared. I will touch base once I see these results.

## 2019-10-30 NOTE — Progress Notes (Signed)
Order sent for [facility] lab.

## 2019-11-02 ENCOUNTER — Other Ambulatory Visit: Payer: Self-pay | Admitting: Family Medicine

## 2019-11-02 ENCOUNTER — Other Ambulatory Visit: Payer: 59

## 2019-11-02 DIAGNOSIS — R319 Hematuria, unspecified: Secondary | ICD-10-CM

## 2019-11-03 LAB — URINE CULTURE
MICRO NUMBER:: 10603382
Result:: NO GROWTH
SPECIMEN QUALITY:: ADEQUATE

## 2019-11-09 ENCOUNTER — Other Ambulatory Visit: Payer: Self-pay | Admitting: Family Medicine

## 2019-11-09 DIAGNOSIS — J3089 Other allergic rhinitis: Secondary | ICD-10-CM

## 2019-11-09 DIAGNOSIS — I10 Essential (primary) hypertension: Secondary | ICD-10-CM

## 2019-11-22 ENCOUNTER — Ambulatory Visit (INDEPENDENT_AMBULATORY_CARE_PROVIDER_SITE_OTHER): Payer: 59 | Admitting: Family Medicine

## 2019-11-22 ENCOUNTER — Encounter: Payer: Self-pay | Admitting: Family Medicine

## 2019-11-22 ENCOUNTER — Other Ambulatory Visit: Payer: Self-pay

## 2019-11-22 VITALS — BP 112/88 | HR 75 | Temp 96.8°F | Ht 66.75 in | Wt 195.0 lb

## 2019-11-22 DIAGNOSIS — M7061 Trochanteric bursitis, right hip: Secondary | ICD-10-CM

## 2019-11-22 DIAGNOSIS — I10 Essential (primary) hypertension: Secondary | ICD-10-CM

## 2019-11-22 DIAGNOSIS — M7062 Trochanteric bursitis, left hip: Secondary | ICD-10-CM

## 2019-11-22 DIAGNOSIS — R1032 Left lower quadrant pain: Secondary | ICD-10-CM

## 2019-11-22 DIAGNOSIS — Z0000 Encounter for general adult medical examination without abnormal findings: Secondary | ICD-10-CM

## 2019-11-22 MED ORDER — HYDROCODONE-HOMATROPINE 5-1.5 MG/5ML PO SYRP: 5.0000 mL | ORAL_SOLUTION | Freq: Four times a day (QID) | ORAL | 0 refills | Status: AC | PRN

## 2019-11-22 NOTE — Patient Instructions (Signed)
Ice hips 20 minutes twice daily    Hip Bursitis  Hip bursitis is inflammation of a fluid-filled sac (bursa) in the hip joint. The bursa prevents the bones in the hip joint from rubbing against each other. Hip bursitis can cause mild to moderate pain, and symptoms often come and go over time. What are the causes? This condition may be caused by:  Injury to the hip.  Overuse of the muscles that surround the hip joint.  Previous injury or surgery of the hip.  Arthritis or gout.  Diabetes.  Thyroid disease.  Infection. In some cases, the cause may not be known. What are the signs or symptoms? Symptoms of this condition include:  Mild or moderate pain in the hip area. Pain may get worse with movement.  Tenderness and swelling of the hip, especially on the outer side of the hip.  In rare cases, the bursa may become infected. This may cause a fever, as well as warmth and redness in the area. Symptoms may come and go. How is this diagnosed? This condition may be diagnosed based on:  A physical exam.  Your medical history.  X-rays.  Removal of fluid from your inflamed bursa for testing (biopsy). You may be sent to a health care provider who specializes in bone diseases (orthopedist) or a provider who specializes in joint inflammation (rheumatologist). How is this treated? This condition is treated by resting, icing, applying pressure (compression), and raising (elevating) the injured area. This is called RICE treatment. In some cases, this may be enough to make your symptoms go away. Treatment may also include:  Using crutches.  Draining fluid out of the bursa to help relieve swelling.  Injecting medicine that helps to reduce inflammation (cortisone).  Additional medicines if the bursa is infected. Follow these instructions at home: Managing pain, stiffness, and swelling   If directed, put ice on the painful area. ? Put ice in a plastic bag. ? Place a towel between  your skin and the bag. ? Leave the ice on for 20 minutes, 2-3 times a day. ? Raise (elevate) your hip above the level of your heart as much as you can without pain. To do this, try putting a pillow under your hips while you lie down. Activity  Return to your normal activities as told by your health care provider. Ask your health care provider what activities are safe for you.  Rest and protect your hip as much as possible until your pain and swelling get better. General instructions  Take over-the-counter and prescription medicines only as told by your health care provider.  Wear compression wraps only as told by your health care provider.  Do not use your hip to support your body weight until your health care provider says that you can. Use crutches as told by your health care provider.  Gently massage and stretch your injured area as often as is comfortable.  Keep all follow-up visits as told by your health care provider. This is important. How is this prevented?  Exercise regularly, as told by your health care provider.  Warm up and stretch before being active.  Cool down and stretch after being active.  If an activity irritates your hip or causes pain, avoid the activity as much as possible.  Avoid sitting down for long periods at a time. Contact a health care provider if you:  Have a fever.  Develop new symptoms.  Have difficulty walking or doing everyday activities.  Have pain that gets  worse or does not get better with medicine.  Develop red skin or a feeling of warmth in your hip area. Get help right away if you:  Cannot move your hip.  Have severe pain. Summary  Hip bursitis is inflammation of a fluid-filled sac (bursa) in the hip joint.  Hip bursitis can cause mild to moderate pain, and symptoms often come and go over time.  This condition is treated with rest, ice, compression, elevation, and medicines. This information is not intended to replace advice  given to you by your health care provider. Make sure you discuss any questions you have with your health care provider. Document Revised: 01/10/2018 Document Reviewed: 01/10/2018 Elsevier Patient Education  2020 [place].  Hip Bursitis Rehab Ask your health care provider which exercises are safe for you. Do exercises exactly as told by your health care provider and adjust them as directed. It is normal to feel mild stretching, pulling, tightness, or discomfort as you do these exercises. Stop right away if you feel sudden pain or your pain gets worse. Do not begin these exercises until told by your health care provider. Stretching exercise This exercise warms up your muscles and joints and improves the movement and flexibility of your hip. This exercise also helps to relieve pain and stiffness. Iliotibial band stretch An iliotibial band is a strong band of muscle tissue that runs from the outer side of your hip to the outer side of your thigh and knee. 1. Lie on your side with your left / right leg in the top position. 2. Bend your left / right knee and grab your ankle. Stretch out your bottom arm to help you balance. 3. Slowly bring your knee back so your thigh is behind your body. 4. Slowly lower your knee toward the floor until you feel a gentle stretch on the outside of your left / right thigh. If you do not feel a stretch and your knee will not fall farther, place the heel of your other foot on top of your knee and pull your knee down toward the floor with your foot. 5. Hold this position for __________ seconds. 6. Slowly return to the starting position. Repeat __________ times. Complete this exercise __________ times a day. Strengthening exercises These exercises build strength and endurance in your hip and pelvis. Endurance is the ability to use your muscles for a long time, even after they get tired. Bridge This exercise strengthens the muscles that move your thigh backward (hip  extensors). 1. Lie on your back on a firm surface with your knees bent and your feet flat on the floor. 2. Tighten your buttocks muscles and lift your buttocks off the floor until your trunk is level with your thighs. ? Do not arch your back. ? You should feel the muscles working in your buttocks and the back of your thighs. If you do not feel these muscles, slide your feet 1-2 inches (2.5-5 cm) farther away from your buttocks. ? If this exercise is too easy, try doing it with your arms crossed over your chest. 3. Hold this position for __________ seconds. 4. Slowly lower your hips to the starting position. 5. Let your muscles relax completely after each repetition. Repeat __________ times. Complete this exercise __________ times a day. Squats This exercise strengthens the muscles in front of your thigh and knee (quadriceps). 1. Stand in front of a table, with your feet and knees pointing straight ahead. You may rest your hands on the table for  balance but not for support. 2. Slowly bend your knees and lower your hips like you are going to sit in a chair. ? Keep your weight over your heels, not over your toes. ? Keep your lower legs upright so they are parallel with the table legs. ? Do not let your hips go lower than your knees. ? Do not bend lower than told by your health care provider. ? If your hip pain increases, do not bend as low. 3. Hold the squat position for __________ seconds. 4. Slowly push with your legs to return to standing. Do not use your hands to pull yourself to standing. Repeat __________ times. Complete this exercise __________ times a day. Hip hike 1. Stand sideways on a bottom step. Stand on your left / right leg with your other foot unsupported next to the step. You can hold on to the railing or wall for balance if needed. 2. Keep your knees straight and your torso square. Then lift your left / right hip up toward the ceiling. 3. Hold this position for __________  seconds. 4. Slowly let your left / right hip lower toward the floor, past the starting position. Your foot should get closer to the floor. Do not lean or bend your knees. Repeat __________ times. Complete this exercise __________ times a day. Single leg stand 1. Without shoes, stand near a railing or in a doorway. You may hold on to the railing or door frame as needed for balance. 2. Squeeze your left / right buttock muscles, then lift up your other foot. ? Do not let your left / right hip push out to the side. ? It is helpful to stand in front of a mirror for this exercise so you can watch your hip. 3. Hold this position for __________ seconds. Repeat __________ times. Complete this exercise __________ times a day. This information is not intended to replace advice given to you by your health care provider. Make sure you discuss any questions you have with your health care provider. Document Revised: 08/29/2018 Document Reviewed: 08/29/2018 Elsevier Patient Education  [place]. Zoster Vaccine, Recombinant injection What is this medicine? ZOSTER VACCINE (ZOS ter vak SEEN) is used to prevent shingles in adults 62 years old and over. This vaccine is not used to treat shingles or nerve pain from shingles. This medicine may be used for other purposes; ask your health care provider or pharmacist if you have questions. COMMON BRAND NAME(S): [facility] What should I tell my health care provider before I take this medicine? They need to know if you have any of these conditions:  blood disorders or disease  cancer like leukemia or lymphoma  immune system problems or therapy  an unusual or allergic reaction to vaccines, other medications, foods, dyes, or preservatives  pregnant or trying to get pregnant  breast-feeding How should I use this medicine? This vaccine is for injection in a muscle. It is given by a health care professional. Talk to your pediatrician regarding the use of  this medicine in children. This medicine is not approved for use in children. Overdosage: If you think you have taken too much of this medicine contact a poison control center or emergency room at once. NOTE: This medicine is only for you. Do not share this medicine with others. What if I miss a dose? Keep appointments for follow-up (booster) doses as directed. It is important not to miss your dose. Call your doctor or health care professional if you are  unable to keep an appointment. What may interact with this medicine?  medicines that suppress your immune system  medicines to treat cancer  steroid medicines like prednisone or cortisone This list may not describe all possible interactions. Give your health care provider a list of all the medicines, herbs, non-prescription drugs, or dietary supplements you use. Also tell them if you smoke, drink alcohol, or use illegal drugs. Some items may interact with your medicine. What should I watch for while using this medicine? Visit your doctor for regular check ups. This vaccine, like all vaccines, may not fully protect everyone. What side effects may I notice from receiving this medicine? Side effects that you should report to your doctor or health care professional as soon as possible:  allergic reactions like skin rash, itching or hives, swelling of the face, lips, or tongue  breathing problems Side effects that usually do not require medical attention (report these to your doctor or health care professional if they continue or are bothersome):  chills  headache  fever  nausea, vomiting  redness, warmth, pain, swelling or itching at site where injected  tiredness This list may not describe all possible side effects. Call your doctor for medical advice about side effects. You may report side effects to FDA at 1-800-FDA-1088. Where should I keep my medicine? This vaccine is only given in a clinic, pharmacy, doctor's office, or other  health care setting and will not be stored at home. NOTE: This sheet is a summary. It may not cover all possible information. If you have questions about this medicine, talk to your doctor, pharmacist, or health care provider.  2020 Elsevier/Gold Standard (2016-12-14 13:20:30)

## 2019-11-22 NOTE — Progress Notes (Signed)
Morgan Wolfe DOB: 1956-03-22 Encounter date: 11/22/2019  This is a 64 y.o. female who presents for complete physical   History of present illness/Additional concerns: Last visit was a month ago and at that time she was having complaints of some back pain.  She was also found to have urinary tract infection. Still has pain and is so bad in morning feels like she will cry. Somewhat better throughout day, but still present. Hasn't changed. Hasn't seen blood in urine. No change in pain with bowel movements or urination. Notes more after resting. Still with left lower abd pain, left lower back pain. Still pain with turning over in bed. Most comfortable on side. No fevers. Antibiotic helped a little, prednisone helped a little. advil 800mg  was really only thing that seemed to knock out pain.   Hypertension: has been checking at home - this morning was 114/80's.   Has had a hysterectomy for fibroids.  No further Pap smears are indicated.  Last colonoscopy was in 01/2012.  Repeat suggested in 10 years. Last mammogram was in November/2020.  This was normal.  shingles vaccine: she has not had this previously.   Past Medical History:  Diagnosis Date   ALLERGIC RHINITIS    Hypertension    PONV (postoperative nausea and vomiting)    Right shoulder pain    Past Surgical History:  Procedure Laterality Date   ANKLE ARTHROSCOPY Left 01/03/2019   Procedure: LEFT ANKLE ARTHROSCOPY AND DEBRIDEMENT;  Surgeon: Newt Minion, MD;  Location: [facility];  Service: Orthopedics;  Laterality: Left;   ANKLE SURGERY Left 2000   ELBOW ARTHROSCOPY Right 2019   TOE SURGERY Right 1998   bone spur   VAGINAL HYSTERECTOMY  1980   per Dr. Harrington Challenger for fibroids; no cancer   Allergies  Allergen Reactions   Augmentin [Amoxicillin-Pot Clavulanate] Nausea And Vomiting    Diarrhea    Sulfonamide Derivatives Itching   Current Meds  Medication Sig   estrogens, conjugated, (PREMARIN) 0.625 MG  tablet TAKE 1 TABLET BY MOUTH  DAILY FOR 21 DAYS THEN DO  NOT TAKE FOR 7 DAYS   fluticasone (FLONASE) 50 MCG/ACT nasal spray SHAKE LIQUID AND USE 2 SPRAYS IN EACH NOSTRIL DAILY   meclizine (ANTIVERT) 25 MG tablet Take 1 tablet (25 mg total) by mouth 3 (three) times daily as needed for dizziness.   naproxen (NAPROSYN) 500 MG tablet Take 500 mg by mouth 2 (two) times daily with a meal.   potassium chloride SA (K-DUR,KLOR-CON) 20 MEQ tablet Take 1 tablet (20 mEq total) by mouth daily.   triamterene-hydrochlorothiazide (DYAZIDE) 37.5-25 MG capsule TAKE 1 CAPSULE BY MOUTH EVERY DAY   [DISCONTINUED] predniSONE (DELTASONE) 20 MG tablet Take 3 tabs PO days 1, 2, then 2 tabs PO days 3,4,5; then 1 tab PO days 6,7 then 1/2 tab PO days 8,9   Social History   Tobacco Use   Smoking status: Never Smoker   Smokeless tobacco: Never Used  Substance Use Topics   Alcohol use: No   Family History  Problem Relation Age of Onset   Coronary artery disease Other        1st degress relatives <50   Hypertension Other    Heart attack Brother 33   Hyperlipidemia Other    Glaucoma Mother    Pancreatic cancer Father    Brain cancer Sister    Stroke Brother 10   Colon cancer Neg Hx    Stomach cancer Neg Hx  Review of Systems  Constitutional: Negative for activity change, appetite change, chills, fatigue, fever and unexpected weight change.  HENT: Negative for congestion, ear pain, hearing loss, sinus pressure, sinus pain, sore throat and trouble swallowing.   Eyes: Negative for pain and visual disturbance.  Respiratory: Negative for cough, chest tightness, shortness of breath and wheezing.   Cardiovascular: Negative for chest pain, palpitations and leg swelling.  Gastrointestinal: Positive for abdominal pain. Negative for blood in stool, constipation, diarrhea, nausea and vomiting.  Genitourinary: Negative for difficulty urinating, dysuria, flank pain, frequency, hematuria, menstrual  problem, vaginal bleeding, vaginal discharge and vaginal pain.  Musculoskeletal: Positive for back pain and gait problem (when back hurting). Negative for arthralgias.  Skin: Negative for rash.  Neurological: Negative for dizziness, weakness, numbness and headaches.  Hematological: Negative for adenopathy. Does not bruise/bleed easily.  Psychiatric/Behavioral: Negative for sleep disturbance and suicidal ideas. The patient is not nervous/anxious.     CBC:  Lab Results  Component Value Date   WBC 18.2 Repeated and verified X2. (HH) 10/20/2019   HGB 11.9 (L) 10/20/2019   HCT 35.5 (L) 10/20/2019   MCHC 33.6 10/20/2019   RDW 13.9 10/20/2019   PLT 402.0 (H) 10/20/2019   CMP: Lab Results  Component Value Date   NA 137 10/20/2019   K 3.2 (L) 10/20/2019   CL 97 10/20/2019   CO2 31 10/20/2019   ANIONGAP 10 01/02/2019   GLUCOSE 96 10/20/2019   GLUCOSE 95 03/02/2006   BUN 19 10/20/2019   CREATININE 0.89 10/20/2019   GFRAA >60 01/02/2019   CALCIUM 9.6 10/20/2019   PROT 6.9 10/20/2019   BILITOT 0.3 10/20/2019   ALKPHOS 69 10/20/2019   ALT 9 10/20/2019   AST 10 10/20/2019   LIPID: Lab Results  Component Value Date   CHOL 181 01/10/2018   TRIG 174.0 (H) 01/10/2018   TRIG 174 (H) 03/02/2006   HDL 60.90 01/10/2018   LDLCALC 85 01/10/2018    Objective:  BP 112/88 (BP Location: Left Arm, Patient Position: Sitting, Cuff Size: Large)    Pulse 75    Temp (!) 96.8 F (36 C) (Temporal)    Ht 5' 6.75" (1.695 m)    Wt 195 lb (88.5 kg)    BMI 30.77 kg/m   Weight: 195 lb (88.5 kg)   BP Readings from Last 3 Encounters:  11/22/19 112/88  10/20/19 130/70  10/13/19 (!) 148/86   Wt Readings from Last 3 Encounters:  11/22/19 195 lb (88.5 kg)  10/20/19 198 lb 12.8 oz (90.2 kg)  10/13/19 198 lb (89.8 kg)    Physical Exam Constitutional:      General: She is not in acute distress.    Appearance: She is well-developed.  HENT:     Head: Normocephalic and atraumatic.     Right Ear:  External ear normal.     Left Ear: External ear normal.     Mouth/Throat:     Pharynx: No oropharyngeal exudate.  Eyes:     Conjunctiva/sclera: Conjunctivae normal.     Pupils: Pupils are equal, round, and reactive to light.  Neck:     Thyroid: No thyromegaly.  Cardiovascular:     Rate and Rhythm: Normal rate and regular rhythm.     Heart sounds: Normal heart sounds. No murmur heard.  No friction rub. No gallop.   Pulmonary:     Effort: Pulmonary effort is normal.     Breath sounds: Normal breath sounds.  Abdominal:     General: Bowel sounds  are normal. There is no distension.     Palpations: Abdomen is soft. There is no mass.     Tenderness: There is abdominal tenderness in the right lower quadrant. There is no right CVA tenderness, left CVA tenderness, guarding or rebound. Negative signs include Murphy's sign and McBurney's sign.     Hernia: No hernia is present. There is no hernia in the left inguinal area, right femoral area, left femoral area or right inguinal area.  Genitourinary:    Exam position: Supine.     Comments: Cervix, uterus surgically absent. There is anterior left lower quadrant tenderness to palpation on bimanual exam, non specific. Palpation in this area reproduces patients LLQ abd pain.  Musculoskeletal:        General: No tenderness or deformity. Normal range of motion.     Cervical back: Normal range of motion and neck supple.     Comments: Bilateral good trochanteric bursa tenderness left more pronounced than right.  Bilateral IT band tenderness to palpation.  There is no pain with internal or external rotation of the hips bilaterally.  There is no pain with hip flexion or extension.  There is bilateral sacroiliac tenderness to palpation.  No significant increase in pain with extension or flexion.  Mild discomfort with twisting extension in lower back/sacroiliac area.  Lymphadenopathy:     Cervical: No cervical adenopathy.  Skin:    General: Skin is warm and  dry.     Findings: No rash.  Neurological:     Mental Status: She is alert and oriented to person, place, and time.     Deep Tendon Reflexes: Reflexes normal.     Reflex Scores:      Tricep reflexes are 2+ on the right side and 2+ on the left side.      Bicep reflexes are 2+ on the right side and 2+ on the left side.      Brachioradialis reflexes are 2+ on the right side and 2+ on the left side.      Patellar reflexes are 2+ on the right side and 2+ on the left side. Psychiatric:        Speech: Speech normal.        Behavior: Behavior normal.        Thought Content: Thought content normal.     Assessment/Plan: Health Maintenance Due  Topic Date Due   HIV Screening  Never done   Health Maintenance reviewed -I encouraged her to consider the shingles vaccination.  She states that she will think about this when she is in the office next time.  Information provided on this.  She is otherwise up-to-date with preventative health maintenance..  1. Preventative health care Currently discomfort she is having in the abdomen, hip, back are limiting her mobility and willingness to exercise.  Encouraged her to keep up with some daily exercise and healthy eating.  2. Essential hypertension Well-controlled.  Continue current medication.  3. Trochanteric bursitis of both hips Encouraged her to consider injections, since she had some improvement with prednisone but pain is very specific to bursa.  We discussed specific stretches to help with trochanteric bursitis and discussed icing.  I do think that this as well as her sacroiliac pain is a big factor for her ongoing discomfort, but I do not feel that this is the only source of discomfort for her.  See below.  4. Left lower quadrant abdominal pain Patient has had ongoing left lower quadrant abdominal pain for a couple  of months without resolution.  Pain had slight improvement with antibiotics and slight improvement with prednisone, but has never  resolved.  I do think it is worthwhile to obtain some further imaging and make sure nothing is going on internally to cause this.  If imaging is negative, I would focus on treating the bursitis and lower back discomfort potentially with sports medicine or physical therapy. - CT Abdomen Pelvis W Contrast; Future  Return for pending imaging results.  Micheline Rough, MD

## 2019-12-06 ENCOUNTER — Ambulatory Visit
Admission: RE | Admit: 2019-12-06 | Discharge: 2019-12-06 | Disposition: A | Discharge status: Home / Self Care | Payer: 59 | Source: Ambulatory Visit | Attending: Family Medicine | Admitting: Family Medicine

## 2019-12-06 DIAGNOSIS — R1032 Left lower quadrant pain: Secondary | ICD-10-CM

## 2019-12-06 MED ORDER — IOPAMIDOL (ISOVUE-300) INJECTION 61%
100.0000 mL | Freq: Once | INTRAVENOUS | Status: AC | PRN
  Administered 2019-12-06: 100 mL via INTRAVENOUS

## 2019-12-29 ENCOUNTER — Other Ambulatory Visit: Payer: Self-pay

## 2019-12-29 ENCOUNTER — Telehealth (INDEPENDENT_AMBULATORY_CARE_PROVIDER_SITE_OTHER): Payer: 59 | Admitting: Family Medicine

## 2019-12-29 ENCOUNTER — Encounter: Payer: Self-pay | Admitting: Family Medicine

## 2019-12-29 VITALS — BP 120/78 | HR 71 | Temp 98.2°F | Ht 66.75 in | Wt 199.2 lb

## 2019-12-29 DIAGNOSIS — M7061 Trochanteric bursitis, right hip: Secondary | ICD-10-CM | POA: Diagnosis not present

## 2019-12-29 DIAGNOSIS — M533 Sacrococcygeal disorders, not elsewhere classified: Secondary | ICD-10-CM | POA: Diagnosis not present

## 2019-12-29 DIAGNOSIS — M545 Low back pain, unspecified: Secondary | ICD-10-CM

## 2019-12-29 DIAGNOSIS — D72829 Elevated white blood cell count, unspecified: Secondary | ICD-10-CM

## 2019-12-29 DIAGNOSIS — M7062 Trochanteric bursitis, left hip: Secondary | ICD-10-CM | POA: Diagnosis not present

## 2019-12-29 DIAGNOSIS — E876 Hypokalemia: Secondary | ICD-10-CM

## 2019-12-29 MED ORDER — TRIAMCINOLONE ACETONIDE 40 MG/ML IJ SUSP
40.0000 mg | Freq: Once | INTRAMUSCULAR | Status: AC
  Administered 2019-12-29: 40 mg via INTRA_ARTICULAR

## 2019-12-29 MED ORDER — ESTROGENS CONJUGATED 0.625 MG PO TABS: ORAL_TABLET | ORAL | 3 refills | Status: DC

## 2019-12-29 NOTE — Patient Instructions (Signed)
Try to decrease the premarin - take alternating half and whole tablets and see how you do with this. If you tolerate this ok; then decrease to 1/2 tab rather than whole.

## 2019-12-29 NOTE — Progress Notes (Signed)
Morgan Wolfe DOB: October 18, 1955 Encounter date: 12/29/2019  This is a 64 y.o. female who presents with No chief complaint on file.   History of present illness: Still having a lot of pain across lower back. Pain with turning over at night. Was taking tylenols and didn't feel like this was helping. Returned to advil.   Abdominal pain was gone, but is back.   Doing side to side stretches. Doing figure 4 stretches. Also doing hamstring stretch on back.   Has been icing hips. Also icing lower back.   White count had been high with previous blood work.  We will plan to recheck blood work today.  Patient has not had any fevers or chills.   Allergies  Allergen Reactions  . Augmentin [Amoxicillin-Pot Clavulanate] Nausea And Vomiting    Diarrhea   . Sulfonamide Derivatives Itching   No outpatient medications have been marked as taking for the 12/29/19 encounter (Telemedicine) with Caren Macadam, MD.    Review of Systems  Constitutional: Negative for chills, fatigue and fever.  Respiratory: Negative for cough, chest tightness, shortness of breath and wheezing.   Cardiovascular: Negative for chest pain, palpitations and leg swelling.  Gastrointestinal: Positive for abdominal pain (sometimes wrapping around from back). Negative for blood in stool, constipation, diarrhea, nausea and vomiting.  Genitourinary: Negative for difficulty urinating, dysuria, flank pain and frequency.  Musculoskeletal: Positive for back pain and gait problem (sometimes due to back/hips). Negative for joint swelling.  Neurological: Negative for weakness.    Objective:  BP 120/78 (BP Location: Left Arm, Patient Position: Sitting, Cuff Size: Large)   Pulse 71   Temp 98.2 F (36.8 C) (Oral)   Ht 5' 6.75" (1.695 m)   Wt 199 lb 3.2 oz (90.4 kg)   BMI 31.43 kg/m   Weight: 199 lb 3.2 oz (90.4 kg)   BP Readings from Last 3 Encounters:  12/29/19 120/78  11/22/19 112/88  10/20/19 130/70   Wt Readings  from Last 3 Encounters:  12/29/19 199 lb 3.2 oz (90.4 kg)  11/22/19 195 lb (88.5 kg)  10/20/19 198 lb 12.8 oz (90.2 kg)    Physical Exam Constitutional:      General: She is not in acute distress.    Appearance: She is well-developed.  Cardiovascular:     Rate and Rhythm: Normal rate and regular rhythm.     Heart sounds: Normal heart sounds. No murmur heard.  No friction rub.  Pulmonary:     Effort: Pulmonary effort is normal. No respiratory distress.     Breath sounds: Normal breath sounds. No wheezing or rales.  Musculoskeletal:     Right lower leg: No edema.     Left lower leg: No edema.     Comments: Bilateral trochanteric bursa tenderness.  Bilateral SI tenderness to palpation.  Difficulty with extension, twisting due to discomfort and SI area.  Neurological:     Mental Status: She is alert and oriented to person, place, and time.  Psychiatric:        Behavior: Behavior normal.    Procedure: Greater trochanter bursa injection Same technique was used for bilateral hips.  After obtaining verbal consent, area of maximum tenderness/bursa palpated and marked with pen tip.  ChloraPrep used to sterilely cleanse area with patient positioned laterally.  Combination of 1 cc Kenalog with 4 cc lidocaine injected into the bursa.  Patient tolerated procedure well and had some immediate improvement in may need specialty evaluation injection site dressed with sterile bandage.  Patient  was advised to post injection pain and to ice the area.  Additional stretches were given to help with hip pain, IT band tenderness, and low back pain.  Assessment/Plan  1. Trochanteric bursitis of both hips Patient tolerated injection well today in the office.  Consider further evaluation pending lumbar x-ray, patient symptoms. - triamcinolone acetonide (KENALOG-40) injection 40 mg - triamcinolone acetonide (KENALOG-40) injection 40 mg  2. Midline low back pain without sciatica, unspecified chronicity See  above.  Suspect patient will need some physical therapy to work out current discomfort.  May need further evaluation with specialist to achieve pain relief. - DG Lumbar Spine Complete; Future  3. Sacroiliac pain See above. - DG Lumbar Spine Complete; Future - Sedimentation rate; Future  4. Leukocytosis, unspecified type - CBC with Differential/Platelet; Future  5. Hypokalemia - Comprehensive metabolic panel; Future    Return for pending xray and lab work.    Micheline Rough, MD

## 2020-01-01 ENCOUNTER — Other Ambulatory Visit: Payer: Self-pay

## 2020-01-01 ENCOUNTER — Other Ambulatory Visit (INDEPENDENT_AMBULATORY_CARE_PROVIDER_SITE_OTHER): Payer: 59

## 2020-01-01 ENCOUNTER — Other Ambulatory Visit: Payer: Self-pay | Admitting: Family Medicine

## 2020-01-01 ENCOUNTER — Other Ambulatory Visit: Payer: Self-pay | Admitting: *Deleted

## 2020-01-01 ENCOUNTER — Ambulatory Visit (INDEPENDENT_AMBULATORY_CARE_PROVIDER_SITE_OTHER)
Admission: RE | Admit: 2020-01-01 | Discharge: 2020-01-01 | Disposition: A | Discharge status: Home / Self Care | Payer: 59 | Source: Ambulatory Visit | Attending: Family Medicine | Admitting: Family Medicine

## 2020-01-01 DIAGNOSIS — E876 Hypokalemia: Secondary | ICD-10-CM

## 2020-01-01 DIAGNOSIS — M533 Sacrococcygeal disorders, not elsewhere classified: Secondary | ICD-10-CM

## 2020-01-01 DIAGNOSIS — M545 Low back pain, unspecified: Secondary | ICD-10-CM

## 2020-01-01 DIAGNOSIS — D72829 Elevated white blood cell count, unspecified: Secondary | ICD-10-CM

## 2020-01-01 DIAGNOSIS — M7061 Trochanteric bursitis, right hip: Secondary | ICD-10-CM

## 2020-01-01 DIAGNOSIS — J3089 Other allergic rhinitis: Secondary | ICD-10-CM

## 2020-01-01 DIAGNOSIS — M7062 Trochanteric bursitis, left hip: Secondary | ICD-10-CM

## 2020-01-01 DIAGNOSIS — I10 Essential (primary) hypertension: Secondary | ICD-10-CM

## 2020-01-01 DIAGNOSIS — Z0000 Encounter for general adult medical examination without abnormal findings: Secondary | ICD-10-CM

## 2020-01-01 LAB — CBC WITH DIFFERENTIAL/PLATELET
Basophils Absolute: 0.1 10*3/uL (ref 0.0–0.1)
Basophils Relative: 0.4 % (ref 0.0–3.0)
Eosinophils Absolute: 0 10*3/uL (ref 0.0–0.7)
Eosinophils Relative: 0.2 % (ref 0.0–5.0)
HCT: 37.1 % (ref 36.0–46.0)
Hemoglobin: 12.3 g/dL (ref 12.0–15.0)
Lymphocytes Relative: 44.3 % (ref 12.0–46.0)
Lymphs Abs: 5.5 10*3/uL — ABNORMAL HIGH (ref 0.7–4.0)
MCHC: 33.1 g/dL (ref 30.0–36.0)
MCV: 85.1 fl (ref 78.0–100.0)
Monocytes Absolute: 0.6 10*3/uL (ref 0.1–1.0)
Monocytes Relative: 5.1 % (ref 3.0–12.0)
Neutro Abs: 6.2 10*3/uL (ref 1.4–7.7)
Neutrophils Relative %: 50 % (ref 43.0–77.0)
Platelets: 352 10*3/uL (ref 150.0–400.0)
RBC: 4.36 Mil/uL (ref 3.87–5.11)
RDW: 13.9 % (ref 11.5–15.5)
WBC: 12.4 10*3/uL — ABNORMAL HIGH (ref 4.0–10.5)

## 2020-01-01 LAB — COMPREHENSIVE METABOLIC PANEL
ALT: 11 U/L (ref 0–35)
AST: 9 U/L (ref 0–37)
Albumin: 3.9 g/dL (ref 3.5–5.2)
Alkaline Phosphatase: 64 U/L (ref 39–117)
BUN: 15 mg/dL (ref 6–23)
CO2: 28 mEq/L (ref 19–32)
Calcium: 8.9 mg/dL (ref 8.4–10.5)
Chloride: 101 mEq/L (ref 96–112)
Creatinine, Ser: 0.86 mg/dL (ref 0.40–1.20)
GFR: 80.91 mL/min (ref >60.00)
Glucose, Bld: 146 mg/dL — ABNORMAL HIGH (ref 70–99)
Potassium: 2.9 mEq/L — ABNORMAL LOW (ref 3.5–5.1)
Sodium: 139 mEq/L (ref 135–145)
Total Bilirubin: 0.3 mg/dL (ref 0.2–1.2)
Total Protein: 7.2 g/dL (ref 6.0–8.3)

## 2020-01-01 LAB — SEDIMENTATION RATE: Sed Rate: 27 mm/hr (ref 0–30)

## 2020-01-01 MED ORDER — POTASSIUM CHLORIDE CRYS ER 20 MEQ PO TBCR: 20.0000 meq | EXTENDED_RELEASE_TABLET | Freq: Every day | ORAL | 3 refills | Status: DC

## 2020-01-03 ENCOUNTER — Other Ambulatory Visit: Payer: Self-pay | Admitting: Family Medicine

## 2020-01-03 DIAGNOSIS — E876 Hypokalemia: Secondary | ICD-10-CM

## 2020-01-05 ENCOUNTER — Telehealth: Payer: Self-pay | Admitting: Family Medicine

## 2020-01-05 NOTE — Telephone Encounter (Signed)
Pt called to say medication potassium chloride SA (KLOR-CON) 20 MEQ tablet   Is cramping her stomach really bad. She wants to know if she should continue to take it?  Please advise

## 2020-01-08 ENCOUNTER — Other Ambulatory Visit: Payer: Self-pay

## 2020-01-08 ENCOUNTER — Other Ambulatory Visit (INDEPENDENT_AMBULATORY_CARE_PROVIDER_SITE_OTHER): Payer: 59

## 2020-01-08 DIAGNOSIS — E876 Hypokalemia: Secondary | ICD-10-CM | POA: Diagnosis not present

## 2020-01-08 NOTE — Telephone Encounter (Signed)
I don't have a great alternative. Even with looking at highest potassium foods, she isn't going to get quite enough. Making sure to take the potassium with at least 8 ounces of water or juice can help with the stomach upset. If she is already doing this, then we might have to look at different blood pressure medication to avoid the potassium drop due to meds. If she were able to drink tomato juice regularly - 6 oz has about 66meq potassium in it; this may help to stabilize. Let me know so I can help pending her thoughts.

## 2020-01-08 NOTE — Telephone Encounter (Signed)
Noted/good

## 2020-01-08 NOTE — Telephone Encounter (Signed)
Spoke with the pt and informed her of the message below.  Patient stated she has not been taking the medication with a total of 8oz of water or juice, will begin doing so and agreed to try tomato juice also.  Message sent to PCP.

## 2020-01-09 LAB — BASIC METABOLIC PANEL
BUN: 18 mg/dL (ref 7–25)
CO2: 28 mmol/L (ref 20–32)
Calcium: 9.1 mg/dL (ref 8.6–10.4)
Chloride: 101 mmol/L (ref 98–110)
Creat: 0.83 mg/dL (ref 0.50–0.99)
Glucose, Bld: 85 mg/dL (ref 65–99)
Potassium: 4 mmol/L (ref 3.5–5.3)
Sodium: 137 mmol/L (ref 135–146)

## 2020-01-12 NOTE — Progress Notes (Signed)
Noted.  Great!

## 2020-01-17 ENCOUNTER — Encounter: Payer: Self-pay | Admitting: Family Medicine

## 2020-01-17 ENCOUNTER — Telehealth (INDEPENDENT_AMBULATORY_CARE_PROVIDER_SITE_OTHER): Payer: 59 | Admitting: Family Medicine

## 2020-01-17 ENCOUNTER — Other Ambulatory Visit: Payer: Self-pay

## 2020-01-17 ENCOUNTER — Telehealth: Payer: Self-pay | Admitting: Family Medicine

## 2020-01-17 VITALS — Temp 100.4°F | Ht 66.0 in | Wt 194.0 lb

## 2020-01-17 DIAGNOSIS — R05 Cough: Secondary | ICD-10-CM

## 2020-01-17 DIAGNOSIS — R509 Fever, unspecified: Secondary | ICD-10-CM

## 2020-01-17 DIAGNOSIS — R059 Cough, unspecified: Secondary | ICD-10-CM

## 2020-01-17 MED ORDER — BENZONATATE 100 MG PO CAPS
100.0000 mg | ORAL_CAPSULE | Freq: Three times a day (TID) | ORAL | 0 refills | Status: DC | PRN
Start: 2020-01-17 — End: 2021-02-25

## 2020-01-17 NOTE — Progress Notes (Signed)
Patient ID: Morgan Wolfe, female   DOB: 1955/07/16, 64 y.o.   MRN: [id number]  This visit type was conducted due to national recommendations for restrictions regarding the COVID-19 pandemic in an effort to limit this patient's exposure and mitigate transmission in our community.   Virtual Visit via Telephone Note  I connected with Morgan Wolfe on 01/17/20 at  4:15 PM EDT by telephone and verified that I am speaking with the correct person using two identifiers.   I discussed the limitations, risks, security and privacy concerns of performing an evaluation and management service by telephone and the availability of in person appointments. I also discussed with the patient that there may be a patient responsible charge related to this service. The patient expressed understanding and agreed to proceed.  Location patient: home Location provider: work or home office Participants present for the call: patient, provider Patient did not have a visit in the prior 7 days to address this/these issue(s).   History of Present Illness: Ms. Morgan Wolfe relates onset of cough this past Sunday.  She thought initially this may be some allergies.  She took her temperature this morning it was 100.4.  No fever since then.  No loss of taste or smell.  Denies any shortness of breath.  Denies any nausea, vomiting, or diarrhea.  She did get the J&J vaccine back in March.  She had prescription for Hydromet sent in but even with taking that her cough has been fairly persistent especially at night.  She had frequent leg cramps.  She feels she is hydrating fairly well.  She is on Dyazide for hypertension.  She did have recent potassium 2.9 back in August and repeat came back normal.  She states she has been very isolated and no known sick exposures.  No dyspnea at rest.  She does not have home pulse oximeter.  Past Medical History:  Diagnosis Date   ALLERGIC RHINITIS    Hypertension    PONV (postoperative nausea and  vomiting)    Right shoulder pain    Past Surgical History:  Procedure Laterality Date   ANKLE ARTHROSCOPY Left 01/03/2019   Procedure: LEFT ANKLE ARTHROSCOPY AND DEBRIDEMENT;  Surgeon: Newt Minion, MD;  Location: [place];  Service: Orthopedics;  Laterality: Left;   ANKLE SURGERY Left 2000   ELBOW ARTHROSCOPY Right 2019   TOE SURGERY Right 1998   bone spur   VAGINAL HYSTERECTOMY  1980   per Dr. Harrington Challenger for fibroids; no cancer    reports that she has never smoked. She has never used smokeless tobacco. She reports that she does not drink alcohol and does not use drugs. family history includes Brain cancer in her sister; Coronary artery disease in an other family member; Glaucoma in her mother; Heart attack (age of onset: 70) in her brother; Heart failure in her mother; Hyperlipidemia in an other family member; Hypertension in an other family member; Kidney failure in her mother; Pancreatic cancer in her father; Stroke (age of onset: 30) in her brother. Allergies  Allergen Reactions   Augmentin [Amoxicillin-Pot Clavulanate] Nausea And Vomiting    Diarrhea    Sulfonamide Derivatives Itching      Observations/Objective: Patient sounds cheerful and well on the phone. I do not appreciate any SOB. Speech and thought processing are grossly intact. Patient reported vitals:  Assessment and Plan: Patient relates 3-day history of cough and low-grade fever.  She is in no respiratory distress at this time.  She has had previous  Covid vaccine as above.  -Proceed with Covid testing tomorrow as scheduled -Stressed importance of quarantine until further clarified -We will send in Tessalon Perles 100 mg every 8 hours as needed for cough -Recommend ER evaluation if she develops increased dyspnea or other concerns in the meantime  Follow Up Instructions:  -As above   99441 5-10 99442 11-20 99443 21-30 I did not refer this patient for an OV in the next 24 hours for  this/these issue(s).  I discussed the assessment and treatment plan with the patient. The patient was provided an opportunity to ask questions and all were answered. The patient agreed with the plan and demonstrated an understanding of the instructions.   The patient was advised to call back or seek an in-person evaluation if the symptoms worsen or if the condition fails to improve as anticipated.  I provided 14 minutes of non-face-to-face time during this encounter.   Carolann Littler, MD

## 2020-01-17 NOTE — Telephone Encounter (Signed)
Spoke with the pt and she stated she has a COVID test scheduled for tomorrow.  Virtual appt scheduled for today at 4:15pm with Dr Elease Hashimoto.

## 2020-01-17 NOTE — Telephone Encounter (Signed)
Sounds like she should have e-visit. Would recommend COVID testing as well to help facilitate treatment plan.

## 2020-01-17 NOTE — Telephone Encounter (Signed)
Pt stated she has started coughing this past Sunday and she has been taking the Hydromet cough syrup she had but it has not helped. She is wondering if something else can be called in? Pt also stated she took her temp was 100.4-she stated it has stayed around 99.00 but now it has gone up to 100  Pt can be reached at [phone]  Pharmacy:  [facility] [place], [place] Phone:  [phone]  Fax:  [phone]

## 2020-01-23 ENCOUNTER — Telehealth: Payer: Self-pay | Admitting: Family Medicine

## 2020-01-23 DIAGNOSIS — I10 Essential (primary) hypertension: Secondary | ICD-10-CM

## 2020-01-23 DIAGNOSIS — J3089 Other allergic rhinitis: Secondary | ICD-10-CM

## 2020-01-23 MED ORDER — TRIAMTERENE-HCTZ 37.5-25 MG PO CAPS: ORAL_CAPSULE | ORAL | 0 refills | Status: DC

## 2020-01-23 NOTE — Telephone Encounter (Signed)
Pt is calling in stating that she is out and need Rx triamterene-hydrochlorothiazide (DYAZIDE) 37.5-25 MG #90 per the pharmacy that is the only way that they are able to refill. Pharm:  [facility] on [address].

## 2020-01-23 NOTE — Telephone Encounter (Signed)
Rx done. 

## 2020-01-25 ENCOUNTER — Telehealth: Payer: Self-pay | Admitting: *Deleted

## 2020-01-25 NOTE — Telephone Encounter (Signed)
Morgan Wolfe spoke with nurse triage. Patient reports she is positive for Covid and she was instructed to call office for further instructions. She states her upper leg hurts.

## 2020-01-25 NOTE — Telephone Encounter (Signed)
Spoke with the pt and offered a virtual visit today with Dr Maudie Mercury.  Patient states she was diagnosed with COVID 1 week ago,  talked with someone, was given instructions, is feeling fine and was told quarantine time ends on tomorrow.  Message sent to PCP.

## 2020-01-26 NOTE — Telephone Encounter (Signed)
Not sure I am understanding message.  If she wanting to see a provider for the leg pain?  If she is feeling fine with regards to Covid, she does not need to have follow-up.  If leg pain is reason for her call, please get more details so I can make recommendations.

## 2020-01-26 NOTE — Telephone Encounter (Signed)
Noted  

## 2020-01-26 NOTE — Telephone Encounter (Signed)
Spoke with the pt and she stated initially she had bilateral leg pain she feels was due to her entire body hurting after coughing for a week.  Patient denies any leg pain or other symptoms at this time and agreed to call back if needed.  Message sent to PCP.

## 2020-02-22 ENCOUNTER — Ambulatory Visit: Payer: 59

## 2020-02-28 ENCOUNTER — Ambulatory Visit: Payer: 59 | Admitting: Physical Therapy

## 2020-03-06 ENCOUNTER — Encounter: Payer: 59 | Admitting: Physical Therapy

## 2020-04-19 ENCOUNTER — Other Ambulatory Visit: Payer: Self-pay | Admitting: Family Medicine

## 2020-04-19 DIAGNOSIS — I10 Essential (primary) hypertension: Secondary | ICD-10-CM

## 2020-04-19 DIAGNOSIS — J3089 Other allergic rhinitis: Secondary | ICD-10-CM

## 2020-04-30 ENCOUNTER — Telehealth: Payer: Self-pay | Admitting: Orthopedic Surgery

## 2020-04-30 NOTE — Telephone Encounter (Signed)
Patient's medical records/disability request sent to [employer].

## 2020-05-14 ENCOUNTER — Other Ambulatory Visit: Payer: Self-pay | Admitting: Family Medicine

## 2020-05-14 ENCOUNTER — Other Ambulatory Visit: Payer: Self-pay | Admitting: Emergency Medicine

## 2020-05-14 DIAGNOSIS — Z1231 Encounter for screening mammogram for malignant neoplasm of breast: Secondary | ICD-10-CM

## 2020-05-30 ENCOUNTER — Telehealth: Payer: Self-pay | Admitting: Family Medicine

## 2020-05-30 DIAGNOSIS — M545 Low back pain, unspecified: Secondary | ICD-10-CM

## 2020-05-30 NOTE — Telephone Encounter (Signed)
Patient is calling and wanted to see if provider can refer her to an orthopedic specialist for pain in her lower back , please advise. CB is [phone]

## 2020-05-31 NOTE — Telephone Encounter (Signed)
That's fine; if she doesn't have preference we can do [facility].

## 2020-05-31 NOTE — Telephone Encounter (Signed)
Patient informed of the message below.  Patient agreed to the referral to [facility] and is aware someone will call with appt info.

## 2020-07-01 ENCOUNTER — Other Ambulatory Visit: Payer: Self-pay

## 2020-07-01 ENCOUNTER — Ambulatory Visit
Admission: RE | Admit: 2020-07-01 | Discharge: 2020-07-01 | Disposition: A | Discharge status: Home / Self Care | Payer: 59 | Source: Ambulatory Visit | Attending: Family Medicine | Admitting: Family Medicine

## 2020-07-01 DIAGNOSIS — Z1231 Encounter for screening mammogram for malignant neoplasm of breast: Secondary | ICD-10-CM

## 2020-07-12 ENCOUNTER — Telehealth: Payer: Self-pay | Admitting: Family Medicine

## 2020-07-12 NOTE — Telephone Encounter (Signed)
Patient is calling and is requesting a call back regarding her medication, please advise. CB is [phone]

## 2020-07-12 NOTE — Telephone Encounter (Signed)
Spoke with the pt and informed her a prior auth was sent to [email or web address] (Key: [id number])  for Premarin due to fax from [facility].  Patient was informed and is aware she will be contacted with further information once a determination is received.

## 2020-07-16 NOTE — Telephone Encounter (Signed)
Fax received from [facility] requesting additional info and given to PCP to complete.

## 2020-07-19 ENCOUNTER — Other Ambulatory Visit: Payer: Self-pay | Admitting: Family Medicine

## 2020-07-19 MED ORDER — ESTRADIOL 0.5 MG PO TABS: 0.5000 mg | ORAL_TABLET | Freq: Every day | ORAL | 1 refills | Status: DC

## 2020-07-19 NOTE — Telephone Encounter (Signed)
Her insurance won't cover premarin any longer. They suggested an alternative (estradiol) so I have sent this in for her. She will have to let me know how she does on this as we may need to dose adjust, but I started with lower end dosing to minimize risk.

## 2020-07-19 NOTE — Telephone Encounter (Signed)
Patient informed of the message below.

## 2020-07-23 ENCOUNTER — Other Ambulatory Visit: Payer: Self-pay | Admitting: Family Medicine

## 2020-07-23 DIAGNOSIS — J3089 Other allergic rhinitis: Secondary | ICD-10-CM

## 2020-07-23 DIAGNOSIS — I10 Essential (primary) hypertension: Secondary | ICD-10-CM

## 2020-08-22 ENCOUNTER — Telehealth: Payer: Self-pay | Admitting: Family Medicine

## 2020-08-22 NOTE — Telephone Encounter (Signed)
Spoke with the pt and informed her the Rx was sent to [facility] and received electronically on 3/4.  Patient agreed to contact the pharmacy.

## 2020-08-22 NOTE — Telephone Encounter (Signed)
Patient is calling and stated that she still haven't received the rx for estradiol (ESTRACE) 0.5 MG tablet and would like a call back, please advise. CB is [phone]

## 2020-09-19 ENCOUNTER — Telehealth: Payer: Self-pay | Admitting: Family Medicine

## 2020-09-19 NOTE — Telephone Encounter (Signed)
Patient dropped off paperwork for a parking placard to be completed by Dr. Ethlyn Gallery.  Patient would like a call when paperwork is ready for pickup at [phone].  Paperwork will be placed in folder.  Please advise.

## 2020-09-23 NOTE — Telephone Encounter (Signed)
Spoke with the pt and informed her the 83-month temporary handicapped placard application was left at the front desk for pick up.

## 2020-09-23 NOTE — Telephone Encounter (Signed)
Form completed; returned to you.

## 2020-10-02 IMAGING — DX DG LUMBAR SPINE COMPLETE 4+V
5 series · 5 of 5 positions shown · non-contrast
Comparison: None.

CLINICAL DATA: Low back pain for 3 months.  No known injury.

EXAM:
LUMBAR SPINE - COMPLETE 4+ VIEW

[l-spine ap]
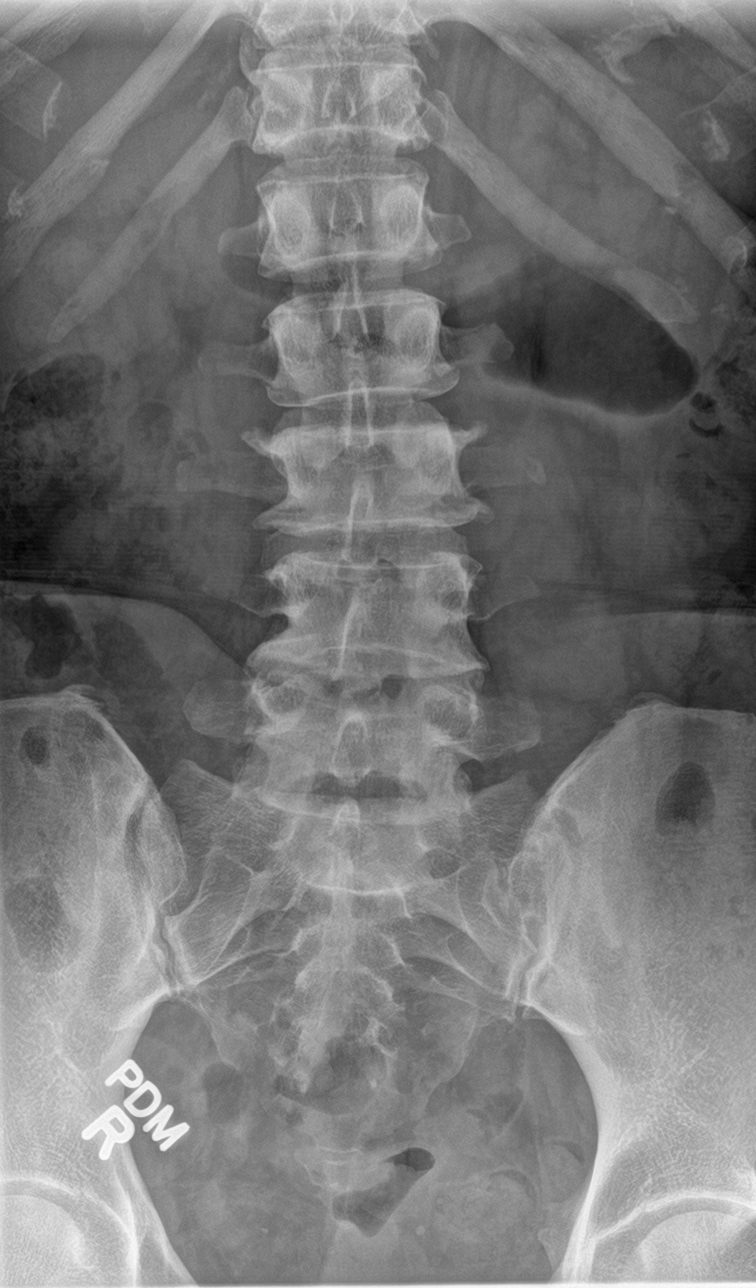

[l-spine obl (1 of 2)]
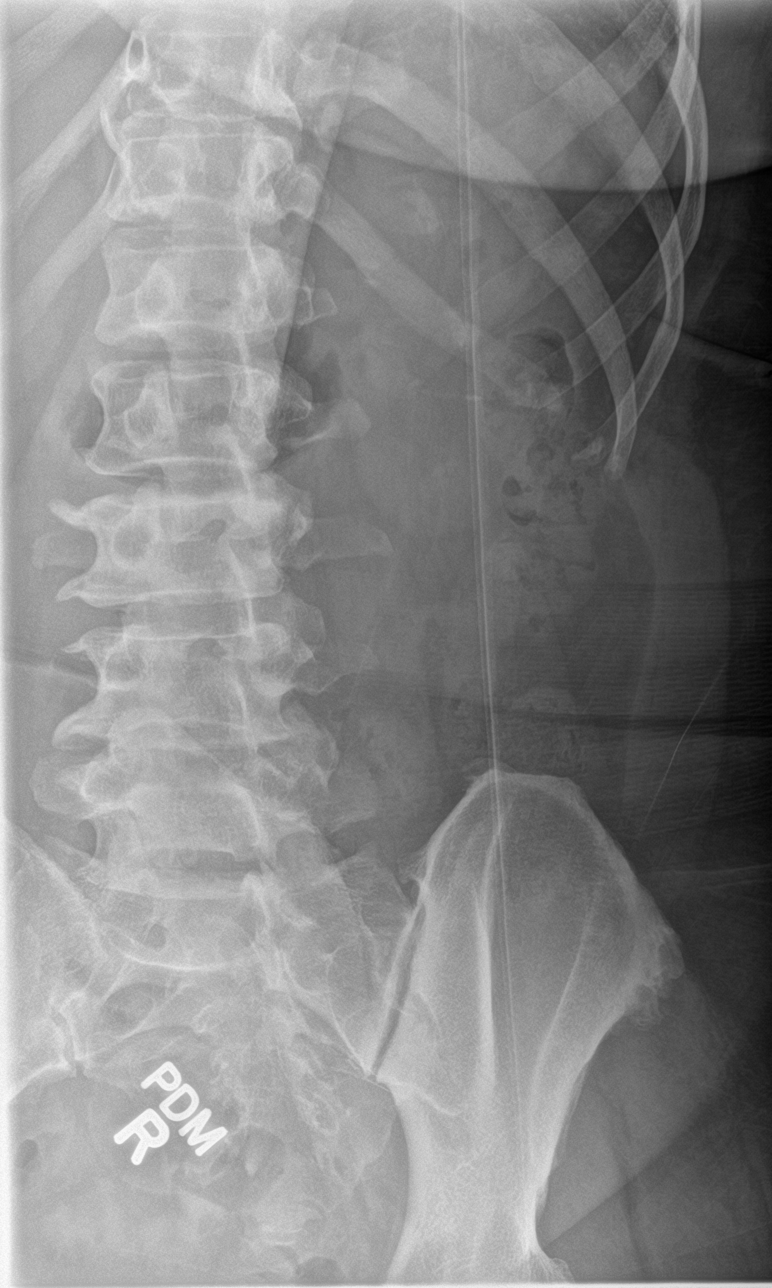

[l-spine obl (2 of 2)]
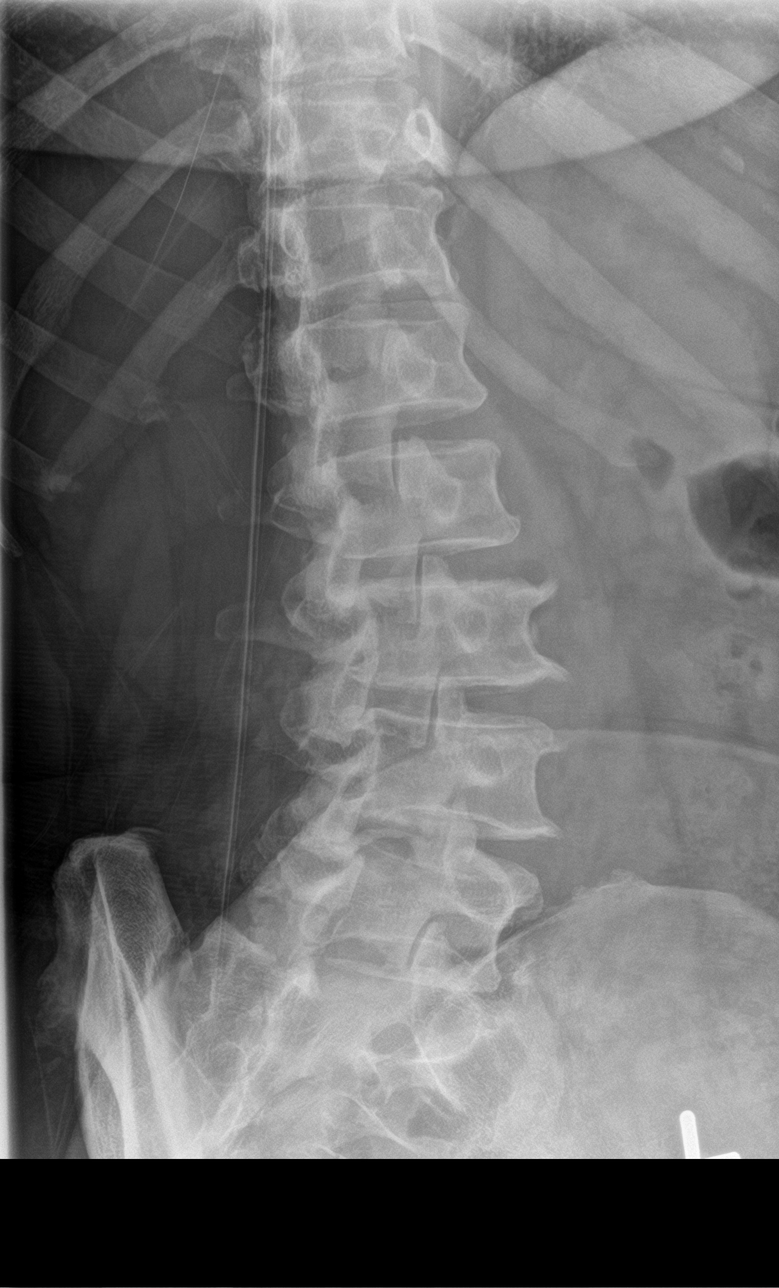

[l-spine lat]
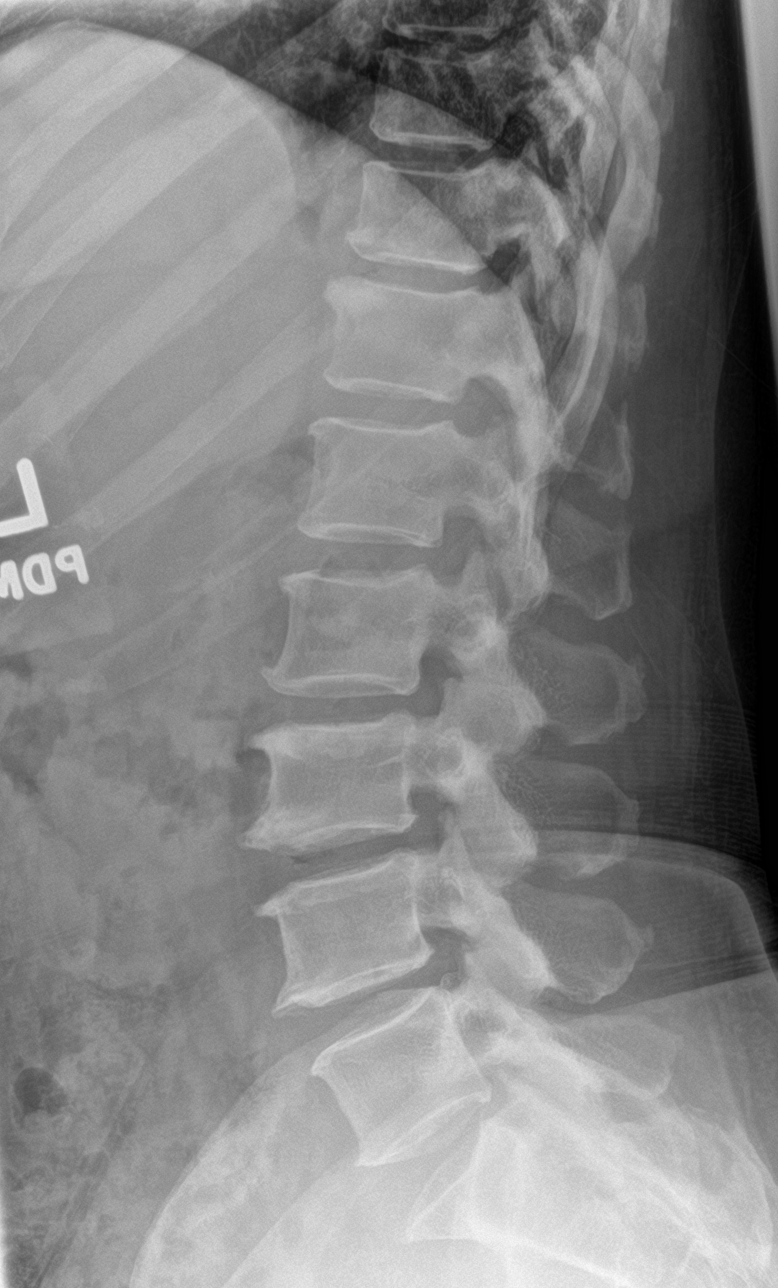

[l-spine spot]
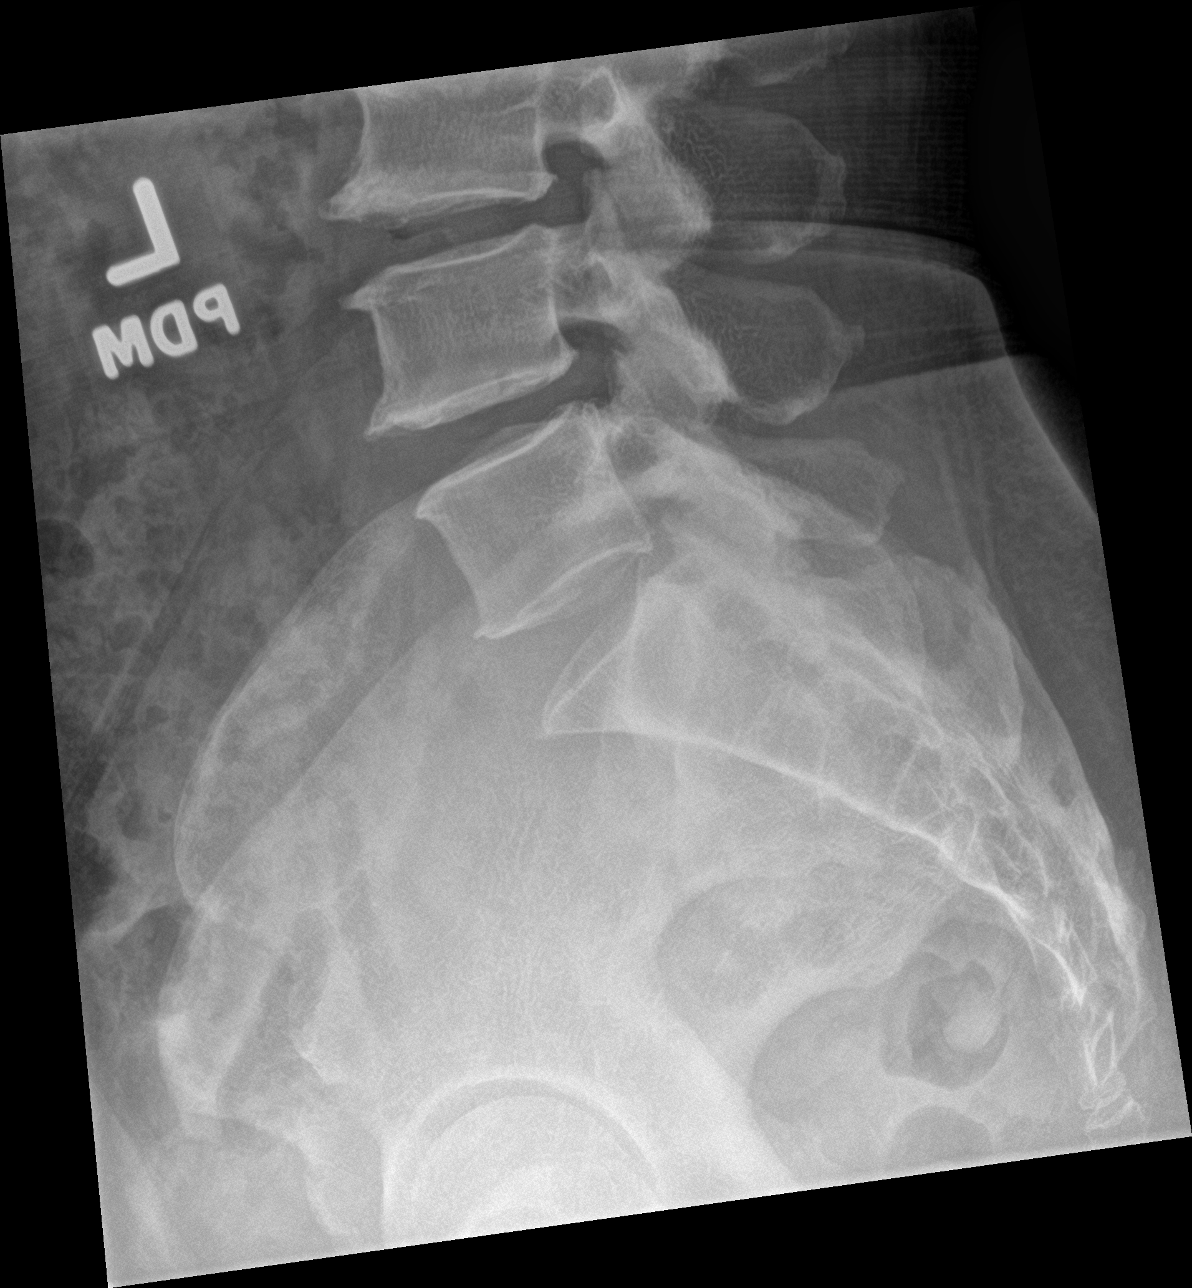

[5 of 5 positions shown; findings below may reference images not displayed]

FINDINGS: There is no evidence of lumbar spine fracture. Alignment is normal.
Intervertebral disc spaces are maintained. There is multilevel
endplate spurring. Mild vacuum disc phenomenon is seen at L3-4.
Paraspinous structures are unremarkable.
IMPRESSION: Mild appearing multilevel degenerative change most notable at L3-4.

## 2020-10-20 ENCOUNTER — Other Ambulatory Visit: Payer: Self-pay | Admitting: Family Medicine

## 2020-10-20 DIAGNOSIS — I10 Essential (primary) hypertension: Secondary | ICD-10-CM

## 2020-10-20 DIAGNOSIS — J3089 Other allergic rhinitis: Secondary | ICD-10-CM

## 2020-10-28 ENCOUNTER — Ambulatory Visit (INDEPENDENT_AMBULATORY_CARE_PROVIDER_SITE_OTHER): Payer: 59 | Admitting: Family Medicine

## 2020-10-28 ENCOUNTER — Other Ambulatory Visit: Payer: Self-pay

## 2020-10-28 ENCOUNTER — Encounter: Payer: Self-pay | Admitting: Family Medicine

## 2020-10-28 VITALS — BP 120/72 | HR 68 | Temp 97.8°F | Ht 66.0 in | Wt 202.1 lb

## 2020-10-28 DIAGNOSIS — J3089 Other allergic rhinitis: Secondary | ICD-10-CM | POA: Diagnosis not present

## 2020-10-28 DIAGNOSIS — M545 Low back pain, unspecified: Secondary | ICD-10-CM

## 2020-10-28 DIAGNOSIS — I10 Essential (primary) hypertension: Secondary | ICD-10-CM

## 2020-10-28 DIAGNOSIS — Z114 Encounter for screening for human immunodeficiency virus [HIV]: Secondary | ICD-10-CM

## 2020-10-28 DIAGNOSIS — D72829 Elevated white blood cell count, unspecified: Secondary | ICD-10-CM | POA: Diagnosis not present

## 2020-10-28 DIAGNOSIS — M25511 Pain in right shoulder: Secondary | ICD-10-CM

## 2020-10-28 DIAGNOSIS — Z13220 Encounter for screening for lipoid disorders: Secondary | ICD-10-CM | POA: Diagnosis not present

## 2020-10-28 LAB — COMPREHENSIVE METABOLIC PANEL
ALT: 11 U/L (ref 0–35)
AST: 13 U/L (ref 0–37)
Albumin: 4.2 g/dL (ref 3.5–5.2)
Alkaline Phosphatase: 80 U/L (ref 39–117)
BUN: 12 mg/dL (ref 6–23)
CO2: 32 mEq/L (ref 19–32)
Calcium: 9.5 mg/dL (ref 8.4–10.5)
Chloride: 99 mEq/L (ref 96–112)
Creatinine, Ser: 0.85 mg/dL (ref 0.40–1.20)
GFR: 73.23 mL/min (ref >60.00)
Glucose, Bld: 92 mg/dL (ref 70–99)
Potassium: 3.2 mEq/L — ABNORMAL LOW (ref 3.5–5.1)
Sodium: 139 mEq/L (ref 135–145)
Total Bilirubin: 0.4 mg/dL (ref 0.2–1.2)
Total Protein: 7.6 g/dL (ref 6.0–8.3)

## 2020-10-28 LAB — CBC WITH DIFFERENTIAL/PLATELET
Basophils Absolute: 0.1 10*3/uL (ref 0.0–0.1)
Basophils Relative: 0.8 % (ref 0.0–3.0)
Eosinophils Absolute: 0.1 10*3/uL (ref 0.0–0.7)
Eosinophils Relative: 1.3 % (ref 0.0–5.0)
HCT: 38.7 % (ref 36.0–46.0)
Hemoglobin: 13 g/dL (ref 12.0–15.0)
Lymphocytes Relative: 58.3 % — ABNORMAL HIGH (ref 12.0–46.0)
Lymphs Abs: 4.3 10*3/uL — ABNORMAL HIGH (ref 0.7–4.0)
MCHC: 33.6 g/dL (ref 30.0–36.0)
MCV: 83.5 fl (ref 78.0–100.0)
Monocytes Absolute: 0.5 10*3/uL (ref 0.1–1.0)
Monocytes Relative: 6.1 % (ref 3.0–12.0)
Neutro Abs: 2.5 10*3/uL (ref 1.4–7.7)
Neutrophils Relative %: 33.5 % — ABNORMAL LOW (ref 43.0–77.0)
Platelets: 322 10*3/uL (ref 150.0–400.0)
RBC: 4.64 Mil/uL (ref 3.87–5.11)
RDW: 13.1 % (ref 11.5–15.5)
WBC: 7.4 10*3/uL (ref 4.0–10.5)

## 2020-10-28 LAB — LIPID PANEL
Cholesterol: 184 mg/dL (ref 0–200)
HDL: 51.5 mg/dL (ref >39.00)
LDL Cholesterol: 99 mg/dL (ref 0–99)
NonHDL: 132.18
Total CHOL/HDL Ratio: 4
Triglycerides: 166 mg/dL — ABNORMAL HIGH (ref 0.0–149.0)
VLDL: 33.2 mg/dL (ref 0.0–40.0)

## 2020-10-28 MED ORDER — LORATADINE 10 MG PO TABS: 10.0000 mg | ORAL_TABLET | Freq: Every day | ORAL | 1 refills | Status: DC

## 2020-10-28 NOTE — Addendum Note (Signed)
Addended by: Amanda Cockayne on: 10/28/2020 09:22 AM   Modules accepted: Orders

## 2020-10-28 NOTE — Progress Notes (Signed)
Morgan Wolfe DOB: 1955/12/28 Encounter date: 10/28/2020  This is a 65 y.o. female who presents with Chief Complaint  Patient presents with   Follow-up     History of present illness: Last visit with me was 12/29/2019.  BP looks good today - was checking at home but stopped when back was bothering her. Dyazide 37.5-25mg  daily. Taking potassium daily - 53meq. Does fine with this.   Using otc allegra. Using flonase regularly. Did better with rx allegra.   Has spells with vertigo- not regular. Hasn't affected her this year. Just keeps on hand if needed.   On occasion uses tessalon perles; just if cough occurs.   Still using estradiol. No hot flashes.    Still dealing with back pain. She is doing dry needling now, which is helping, but she still is having some pain. They are also working with right shoulder. She is seeing Dr. Tonita Cong right now. She did have some additional imaging at ortho. Insurance is questioning coverage - she has received a couple of bills. Dry needling seems to help with muscle spasm, pain. Takes naproxen regularly.  Has handicap paper she would like filled out today. She is wanting permament card (I had filled out for 6 months and she has been approved for permanent disability since her last visit). Ankle is constantly hurting her; daily. Limits walking. This is in addition to current back issues.    Allergies  Allergen Reactions   Augmentin [Amoxicillin-Pot Clavulanate] Nausea And Vomiting    Diarrhea    Sulfonamide Derivatives Itching   Current Meds  Medication Sig   benzonatate (TESSALON PERLES) 100 MG capsule Take 1 capsule (100 mg total) by mouth 3 (three) times daily as needed for cough.   estradiol (ESTRACE) 0.5 MG tablet Take 1 tablet (0.5 mg total) by mouth daily.   fluticasone (FLONASE) 50 MCG/ACT nasal spray SHAKE LIQUID AND USE 2 SPRAYS IN EACH NOSTRIL DAILY   loratadine (CLARITIN) 10 MG tablet Take 1 tablet (10 mg total) by mouth daily.    meclizine (ANTIVERT) 25 MG tablet Take 1 tablet (25 mg total) by mouth 3 (three) times daily as needed for dizziness.   naproxen (NAPROSYN) 500 MG tablet Take 500 mg by mouth 2 (two) times daily with a meal.   potassium chloride SA (KLOR-CON) 20 MEQ tablet Take 1 tablet (20 mEq total) by mouth daily.   triamterene-hydrochlorothiazide (DYAZIDE) 37.5-25 MG capsule TAKE 1 CAPSULE BY MOUTH EVERY DAY    Review of Systems  Constitutional:  Negative for chills, fatigue and fever.  Respiratory:  Negative for cough, chest tightness, shortness of breath and wheezing.   Cardiovascular:  Negative for chest pain, palpitations and leg swelling.   Objective:  BP 120/72 (BP Location: Left Arm, Patient Position: Sitting, Cuff Size: Large)   Pulse 68   Temp 97.8 F (36.6 C) (Oral)   Ht 5\' 6"  (1.676 m)   Wt 202 lb 1.6 oz (91.7 kg)   SpO2 96%   BMI 32.62 kg/m   Weight: 202 lb 1.6 oz (91.7 kg)   BP Readings from Last 3 Encounters:  10/28/20 120/72  12/29/19 120/78  11/22/19 112/88   Wt Readings from Last 3 Encounters:  10/28/20 202 lb 1.6 oz (91.7 kg)  01/17/20 194 lb (88 kg)  12/29/19 199 lb 3.2 oz (90.4 kg)    Physical Exam Constitutional:      General: She is not in acute distress.    Appearance: She is well-developed.  Cardiovascular:  Rate and Rhythm: Normal rate and regular rhythm.     Heart sounds: Normal heart sounds. No murmur heard.   No friction rub.  Pulmonary:     Effort: Pulmonary effort is normal. No respiratory distress.     Breath sounds: Normal breath sounds. No wheezing or rales.  Musculoskeletal:     Right lower leg: No edema.     Left lower leg: No edema.  Neurological:     Mental Status: She is alert and oriented to person, place, and time.  Psychiatric:        Behavior: Behavior normal.    Assessment/Plan  1. Essential hypertension Well controlled; continue current medication.  - CBC with Differential/Platelet; Future - Comprehensive metabolic panel;  Future  2. Allergic rhinitis due to fungal spores, unspecified seasonality Typically uses allegra, flonase just if needed.  - loratadine (CLARITIN) 10 MG tablet; Take 1 tablet (10 mg total) by mouth daily.  Dispense: 90 tablet; Refill: 1  3. Leukocytosis, unspecified type Recheck bloodwork today.   4. Lipid screening - Lipid panel; Future  5. Screening for HIV (human immunodeficiency virus) - HIV Antibody (routine testing w rflx); Future  6. Low back pain - she is following regularly with ortho for this and currently in PT and getting benefit from treatment. I have asked our manager to look at patient's papers from insurance companies - may need to submit record request for insurance purposes.   7. Right shoulder pain - also following with ortho for this. She is getting benefit from dry needling through them.   Return in about 6 months (around 04/29/2021) for physical exam.      Micheline Rough, MD

## 2020-10-29 LAB — HIV ANTIBODY (ROUTINE TESTING W REFLEX): HIV 1&2 Ab, 4th Generation: NONREACTIVE

## 2020-10-31 ENCOUNTER — Other Ambulatory Visit: Payer: Self-pay | Admitting: Family Medicine

## 2020-10-31 DIAGNOSIS — D72829 Elevated white blood cell count, unspecified: Secondary | ICD-10-CM

## 2020-11-04 ENCOUNTER — Other Ambulatory Visit: Payer: Self-pay | Admitting: Family Medicine

## 2020-11-04 ENCOUNTER — Other Ambulatory Visit: Payer: 59

## 2020-11-04 ENCOUNTER — Other Ambulatory Visit: Payer: Self-pay

## 2020-11-04 DIAGNOSIS — D72829 Elevated white blood cell count, unspecified: Secondary | ICD-10-CM

## 2020-11-05 LAB — PATHOLOGIST SMEAR REVIEW

## 2020-11-11 ENCOUNTER — Telehealth: Payer: Self-pay | Admitting: Family Medicine

## 2020-11-11 NOTE — Telephone Encounter (Signed)
Pt is calling back to get her lab results

## 2020-11-12 NOTE — Telephone Encounter (Signed)
This has been taking care of.

## 2020-11-13 ENCOUNTER — Telehealth: Payer: Self-pay | Admitting: Internal Medicine

## 2020-11-13 NOTE — Telephone Encounter (Signed)
Received a new hem referral from Dr. Ethlyn Gallery for leukocytosis. Morgan Wolfe has been cld and scheduled to see Dr. Julien Nordmann on 7/6 at 11:33m w/labs at 11:15am. Pt aware to arrive 15 minutes early.

## 2020-11-20 ENCOUNTER — Inpatient Hospital Stay: Payer: 59 | Attending: Internal Medicine | Admitting: Internal Medicine

## 2020-11-20 ENCOUNTER — Other Ambulatory Visit: Payer: Self-pay

## 2020-11-20 ENCOUNTER — Inpatient Hospital Stay: Payer: 59

## 2020-11-20 ENCOUNTER — Other Ambulatory Visit: Payer: Self-pay | Admitting: Internal Medicine

## 2020-11-20 VITALS — BP 117/76 | HR 80 | Temp 97.7°F | Resp 19 | Ht 66.0 in | Wt 201.8 lb

## 2020-11-20 DIAGNOSIS — D72820 Lymphocytosis (symptomatic): Secondary | ICD-10-CM

## 2020-11-20 DIAGNOSIS — I10 Essential (primary) hypertension: Secondary | ICD-10-CM | POA: Diagnosis not present

## 2020-11-20 LAB — CMP (CANCER CENTER ONLY)
ALT: 9 U/L (ref 0–44)
AST: 13 U/L — ABNORMAL LOW (ref 15–41)
Albumin: 3.7 g/dL (ref 3.5–5.0)
Alkaline Phosphatase: 79 U/L (ref 38–126)
Anion gap: 8 (ref 5–15)
BUN: 10 mg/dL (ref 8–23)
CO2: 30 mmol/L (ref 22–32)
Calcium: 9.8 mg/dL (ref 8.9–10.3)
Chloride: 101 mmol/L (ref 98–111)
Creatinine: 0.92 mg/dL (ref 0.44–1.00)
GFR, Estimated: >60 mL/min (ref >60)
Glucose, Bld: 92 mg/dL (ref 70–99)
Potassium: 3.5 mmol/L (ref 3.5–5.1)
Sodium: 139 mmol/L (ref 135–145)
Total Bilirubin: 0.5 mg/dL (ref 0.3–1.2)
Total Protein: 8 g/dL (ref 6.5–8.1)

## 2020-11-20 LAB — CBC WITH DIFFERENTIAL (CANCER CENTER ONLY)
Abs Immature Granulocytes: 0.01 10*3/uL (ref 0.00–0.07)
Basophils Absolute: 0.1 10*3/uL (ref 0.0–0.1)
Basophils Relative: 1 %
Eosinophils Absolute: 0.1 10*3/uL (ref 0.0–0.5)
Eosinophils Relative: 1 %
HCT: 37.6 % (ref 36.0–46.0)
Hemoglobin: 12.7 g/dL (ref 12.0–15.0)
Immature Granulocytes: 0 %
Lymphocytes Relative: 57 %
Lymphs Abs: 4.5 10*3/uL — ABNORMAL HIGH (ref 0.7–4.0)
MCH: 28.2 pg (ref 26.0–34.0)
MCHC: 33.8 g/dL (ref 30.0–36.0)
MCV: 83.6 fL (ref 80.0–100.0)
Monocytes Absolute: 0.5 10*3/uL (ref 0.1–1.0)
Monocytes Relative: 6 %
Neutro Abs: 2.8 10*3/uL (ref 1.7–7.7)
Neutrophils Relative %: 35 %
Platelet Count: 316 10*3/uL (ref 150–400)
RBC: 4.5 MIL/uL (ref 3.87–5.11)
RDW: 12.8 % (ref 11.5–15.5)
WBC Count: 7.9 10*3/uL (ref 4.0–10.5)
nRBC: 0 % (ref 0.0–0.2)

## 2020-11-20 LAB — LACTATE DEHYDROGENASE: LDH: 155 U/L (ref 98–192)

## 2020-11-20 NOTE — Progress Notes (Signed)
[facility] [phone]   [phone]  CONSULT NOTE  REFERRING PHYSICIAN: Dr. Micheline Rough  REASON FOR CONSULTATION:  65 years old white female with lymphocytosis.  HPI Morgan Wolfe is a 65 y.o. female with past medical history significant for hypertension, allergic rhinitis, right shoulder pain and currently undergoing physical therapy and rehabilitation.  The patient was seen recently by her primary care physician for routine physical examination and refill of her blood pressure medications. During her evaluation she had CBC performed on 10/28/2020 and that showed lymphocytosis with relative lymphocyte count of 58.3% but the absolute lymphocyte count was 5300.  The patient had similar finding of elevated lymphocyte count over the last few years.  She was referred to me today for evaluation and recommendation regarding her condition. When seen today the patient is feeling fine with no concerning complaints except for an anxiety about her visit today.  She denied having any weight loss or night sweats.  She has no palpable lymphadenopathy she has no bleeding, bruises or ecchymosis.  She has no chest pain, shortness of breath, cough or hemoptysis.  She has no nausea, vomiting, diarrhea or constipation.  She has no headache or visual changes. Family history significant for mother with congestive heart failure and end-stage renal disease.  Father had pancreatic cancer.  Brother had a stroke and another brother had heart disease and a sister had brain tumor. The patient is single and has no children.  She is currently on disability and used to work in a warehouse.  She has no history of smoking, alcohol or drug abuse.  HPI  Past Medical History:  Diagnosis Date   ALLERGIC RHINITIS    Hypertension    PONV (postoperative nausea and vomiting)    Right shoulder pain     Past Surgical History:  Procedure Laterality Date   ANKLE ARTHROSCOPY Left  01/03/2019   Procedure: LEFT ANKLE ARTHROSCOPY AND DEBRIDEMENT;  Surgeon: Newt Minion, MD;  [facility] [facility];  Service: Orthopedics;  Laterality: Left;   ANKLE SURGERY Left 2000   ELBOW ARTHROSCOPY Right 2019   TOE SURGERY Right 1998   bone spur   VAGINAL HYSTERECTOMY  1980   per Dr. Harrington Challenger for fibroids; no cancer    Family History  Problem Relation Age of Onset   Coronary artery disease Other        1st degress relatives <50   Hypertension Other    Heart attack Brother 80   Hyperlipidemia Other    Glaucoma Mother    Heart failure Mother    Kidney failure Mother    Pancreatic cancer Father    Brain cancer Sister    Stroke Brother 64   Colon cancer Neg Hx    Stomach cancer Neg Hx     Social History Social History   Tobacco Use   Smoking status: Never   Smokeless tobacco: Never  Substance Use Topics   Alcohol use: No   Drug use: No    Allergies  Allergen Reactions   Augmentin [Amoxicillin-Pot Clavulanate] Nausea And Vomiting    Diarrhea    Sulfonamide Derivatives Itching    Current Outpatient Medications  Medication Sig Dispense Refill   benzonatate (TESSALON PERLES) 100 MG capsule Take 1 capsule (100 mg total) by mouth 3 (three) times daily as needed for cough. 30 capsule 0   estradiol (ESTRACE) 0.5 MG tablet Take 1 tablet (0.5 mg total) by mouth daily. 90 tablet 1  fluticasone (FLONASE) 50 MCG/ACT nasal spray SHAKE LIQUID AND USE 2 SPRAYS IN EACH NOSTRIL DAILY 16 g 2   loratadine (CLARITIN) 10 MG tablet Take 1 tablet (10 mg total) by mouth daily. 90 tablet 1   meclizine (ANTIVERT) 25 MG tablet Take 1 tablet (25 mg total) by mouth 3 (three) times daily as needed for dizziness. 30 tablet 4   naproxen (NAPROSYN) 500 MG tablet Take 500 mg by mouth 2 (two) times daily with a meal.     potassium chloride SA (KLOR-CON) 20 MEQ tablet Take 1 tablet (20 mEq total) by mouth daily. 90 tablet 3   triamterene-hydrochlorothiazide (DYAZIDE) 37.5-25 MG  capsule TAKE 1 CAPSULE BY MOUTH EVERY DAY 90 capsule 1   No current facility-administered medications for this visit.    Review of Systems  Constitutional: negative Eyes: negative Ears, nose, mouth, throat, and face: negative Respiratory: negative Cardiovascular: negative Gastrointestinal: negative Genitourinary:negative Integument/breast: negative Hematologic/lymphatic: negative Musculoskeletal:negative Neurological: negative Behavioral/Psych: positive for anxiety Endocrine: negative Allergic/Immunologic: negative  Physical Exam  GDJ:MEQAS, healthy, no distress, well nourished, well developed, and anxious SKIN: skin color, texture, turgor are normal, no rashes or significant lesions HEAD: Normocephalic, No masses, lesions, tenderness or abnormalities EYES: normal, PERRLA, Conjunctiva are pink and non-injected EARS: External ears normal, Canals clear OROPHARYNX:no exudate, no erythema, and lips, buccal mucosa, and tongue normal  NECK: supple, no adenopathy, no JVD LYMPH:  no palpable lymphadenopathy, no hepatosplenomegaly BREAST:not examined LUNGS: clear to auscultation , and palpation HEART: regular rate & rhythm, no murmurs, and no gallops ABDOMEN:abdomen soft, non-tender, normal bowel sounds, and no masses or organomegaly BACK: No CVA tenderness, Range of motion is normal EXTREMITIES:no joint deformities, effusion, or inflammation, no edema  NEURO: alert & oriented x 3 with fluent speech, no focal motor/sensory deficits  PERFORMANCE STATUS: ECOG 0  LABORATORY DATA: Lab Results  Component Value Date   WBC 7.9 11/20/2020   HGB 12.7 11/20/2020   HCT 37.6 11/20/2020   MCV 83.6 11/20/2020   PLT 316 11/20/2020      Chemistry      Component Value Date/Time   NA 139 10/28/2020 0922   K 3.2 (L) 10/28/2020 0922   CL 99 10/28/2020 0922   CO2 32 10/28/2020 0922   BUN 12 10/28/2020 0922   CREATININE 0.85 10/28/2020 0922   CREATININE 0.83 01/08/2020 0736       Component Value Date/Time   CALCIUM 9.5 10/28/2020 0922   ALKPHOS 80 10/28/2020 0922   AST 13 10/28/2020 0922   ALT 11 10/28/2020 0922   BILITOT 0.4 10/28/2020 0922       RADIOGRAPHIC STUDIES: No results found.  ASSESSMENT: This is a very pleasant 65 years old African-American female presented for evaluation of lymphocytosis was concern about chronic lymphocytic leukemia.  Her absolute lymphocyte count is less than 5000 and she would not meet the criteria for CLL at this point.   PLAN: I had a lengthy discussion with the patient today about her current condition and further investigation to confirm her diagnosis. I ordered repeat CBC today which showed normal total white blood count but her absolute lymphocyte count was 4500. I also order comprehensive metabolic panel and LDH and flow cytometry of the peripheral blood for CLL panel. I do not think the patient meets the criteria for CLL at this point but we will need to monitor her closely. I recommended for her to continue on observation with repeat CBC, comprehensive metabolic panel and LDH in 6 months. For the  hypertension, she will continue her routine follow-up visit and evaluation by her primary care physician. She was advised to call immediately if she has any other concerning symptoms in the interval.  The patient voices understanding of current disease status and treatment options and is in agreement with the current care plan.  All questions were answered. The patient knows to call the clinic with any problems, questions or concerns. We can certainly see the patient much sooner if necessary.  Thank you so much for allowing me to participate in the care of [name]. I will continue to follow up the patient with you and assist in her care.  The total time spent in the appointment was 60 minutes.  Disclaimer: This note was dictated with voice recognition software. Similar sounding words can inadvertently be transcribed  and may not be corrected upon review.   Eilleen Kempf November 20, 2020, 12:12 PM

## 2020-11-21 LAB — SURGICAL PATHOLOGY

## 2020-11-22 LAB — FLOW CYTOMETRY

## 2020-11-25 ENCOUNTER — Telehealth: Payer: Self-pay | Admitting: Family Medicine

## 2020-11-25 NOTE — Telephone Encounter (Signed)
Patient dropped off DMV form to be completed. Call patient when ready for pick up. [phone]. Placed in folder.

## 2020-11-26 NOTE — Telephone Encounter (Signed)
Spoke with the pt and informed her Dr Ethlyn Gallery completed the permanent handicapped placard and this was left at the front desk for her to pick up.

## 2021-01-24 ENCOUNTER — Other Ambulatory Visit: Payer: Self-pay | Admitting: Family Medicine

## 2021-02-11 ENCOUNTER — Other Ambulatory Visit: Payer: Self-pay | Admitting: Family Medicine

## 2021-02-25 ENCOUNTER — Telehealth (INDEPENDENT_AMBULATORY_CARE_PROVIDER_SITE_OTHER): Payer: 59 | Admitting: Family Medicine

## 2021-02-25 ENCOUNTER — Encounter: Payer: Self-pay | Admitting: Family Medicine

## 2021-02-25 VITALS — Temp 98.5°F | Wt 193.0 lb

## 2021-02-25 DIAGNOSIS — J029 Acute pharyngitis, unspecified: Secondary | ICD-10-CM | POA: Diagnosis not present

## 2021-02-25 DIAGNOSIS — R059 Cough, unspecified: Secondary | ICD-10-CM

## 2021-02-25 DIAGNOSIS — R0981 Nasal congestion: Secondary | ICD-10-CM | POA: Diagnosis not present

## 2021-02-25 MED ORDER — BENZONATATE 100 MG PO CAPS: 100.0000 mg | ORAL_CAPSULE | Freq: Three times a day (TID) | ORAL | 0 refills | Status: DC | PRN

## 2021-02-25 NOTE — Progress Notes (Signed)
Virtual Visit via Telephone Note  I connected with Morgan Wolfe on 02/25/21 at 12:00 PM EDT by telephone and verified that I am speaking with the correct person using two identifiers.   I discussed the limitations, risks, security and privacy concerns of performing an evaluation and management service by telephone and the availability of in person appointments. I also discussed with the patient that there may be a patient responsible charge related to this service. The patient expressed understanding and agreed to proceed.  Location patient: home, [place] Location provider: work or home office Participants present for the call: patient, provider Patient did not have a visit with me in the prior 7 days to address this/these issue(s).   History of Present Illness:  Acute telemedicine visit for cough: -Onset: about 3 days ago -Symptoms include: nasal congestion, cough, sore throat -Denies: fevers, body aches, CP, SOB, NVD, inability to eat/drink/get out of bed -Has tried:none -Pertinent past medical history: see below -Pertinent medication allergies:  Allergies  Allergen Reactions   Augmentin [Amoxicillin-Pot Clavulanate] Nausea And Vomiting    Diarrhea    Sulfonamide Derivatives Itching  -COVID-19 vaccine status: has had the J and J   Past Medical History:  Diagnosis Date   ALLERGIC RHINITIS    Hypertension    PONV (postoperative nausea and vomiting)    Right shoulder pain      Observations/Objective: Patient sounds cheerful and well on the phone. I do not appreciate any SOB. Speech and thought processing are grossly intact. Patient reported vitals:  Assessment and Plan:  Nasal congestion  Sore throat  Cough, unspecified type  -we discussed possible serious and likely etiologies, options for evaluation and workup, limitations of telemedicine visit vs in person visit, treatment, treatment risks and precautions. Pt prefers to treat via telemedicine empirically rather than  in person at this moment. Query VURI, covid19 vs other. Opted for treatment with tessalon and symptomatic care per pt instruction. Also advised of covid testing options, limitation and treatment options if positive and in treatment window ([facility] pharmacy or virtual visit follow up. ) Advised to seek prompt in person care if worsening, new symptoms arise, or if is not improving with treatment. Advised of options for inperson care in case PCP office not available. Did let the patient know that I only do telemedicine shifts for [facility] on Tuesdays and Thursdays and advised a follow up visit with PCP or at an [facility] if has further questions or concerns.   Follow Up Instructions:  I did not refer this patient for an OV with me in the next 24 hours for this/these issue(s).  I discussed the assessment and treatment plan with the patient. The patient was provided an opportunity to ask questions and all were answered. The patient agreed with the plan and demonstrated an understanding of the instructions.   I spent 18 minutes on the date of this visit in the care of this patient. See summary of tasks completed to properly care for this patient in the detailed notes above which also included counseling of above, review of PMH, medications, allergies, evaluation of the patient and ordering and/or  instructing patient on testing and care options.     Lucretia Kern, DO

## 2021-02-25 NOTE — Patient Instructions (Signed)
  HOME CARE TIPS:  -COVID19 testing information: [email or web address].[email or web address]  Most pharmacies also offer testing and home test kits. If the Covid19 test is positive and you desire antiviral treatment, please contact a [facility] or schedule a follow up virtual visit through your primary care office or through the [facility].  Other test to treat options: ConnectRV.is?[email or web address]  -I sent the medication(s) we discussed to your pharmacy: Meds ordered this encounter  Medications   benzonatate (TESSALON PERLES) 100 MG capsule    Sig: Take 1 capsule (100 mg total) by mouth 3 (three) times daily as needed.    Dispense:  20 capsule    Refill:  0     -can use tylenol or aleve if needed for fevers, aches and pains per instructions  -can use nasal saline a few times per day if you have nasal congestion; sometimes  a short course of Afrin nasal spray for 3 days can help with symptoms as well  -stay hydrated, drink plenty of fluids and eat small healthy meals - avoid dairy  -can take 1000 IU (13mcg) Vit D3 and 100-500 mg of Vit C daily per instructions  -If the Covid test is positive, check out the [facility] website for more information on home care, transmission and treatment for COVID19  -follow up with your doctor in 2-3 days unless improving and feeling better  -stay home while sick, except to seek medical care. If you have COVID19, ideally it would be best to stay home for a full 10 days since the onset of symptoms PLUS one day of no fever and feeling better. Wear a good mask that fits snugly (such as N95 or KN95) if around others to reduce the risk of transmission.  It was nice to meet you today, and I really hope you are feeling better soon. I help [facility] out with telemedicine visits on Tuesdays and Thursdays and am available for visits on those days. If you have any concerns or questions following this visit please schedule a  follow up visit with your Primary Care doctor or seek care at a local urgent care clinic to avoid delays in care.    Seek in person care or schedule a follow up video visit promptly if your symptoms worsen, new concerns arise or you are not improving with treatment. Call 911 and/or seek emergency care if your symptoms are severe or life threatening.

## 2021-02-27 ENCOUNTER — Telehealth: Payer: Self-pay | Admitting: Family Medicine

## 2021-02-27 NOTE — Telephone Encounter (Signed)
Pt had video visit with dr Maudie Mercury on 02-25-2021 and pt is calling to report the benzonatate for her cough is not work . [address]. Pt would like to know the next step. Please advise

## 2021-02-27 NOTE — Telephone Encounter (Signed)
Patient informed of the message below.

## 2021-03-04 ENCOUNTER — Telehealth: Payer: Self-pay | Admitting: *Deleted

## 2021-03-04 NOTE — Telephone Encounter (Signed)
Spoke with the patient and she complains of a recurrent non-productive cough and stated she has noticed some shortness of breath since last week but does not feel worse.  Stated she tried Mucinex and Delsym with no relief (advised per nurse practitioner via phone call from 10/13).  Appt offered for a virtual visit today, patient declined and an appt was scheduled for 10/19 with Dr Elease Hashimoto.  Patient advised to go to the ER if she develops any increased breathing problems, worsening of shortness of breath or other symptoms.

## 2021-03-04 NOTE — Telephone Encounter (Signed)
-----   Message from Caren Macadam, MD sent at 03/03/2021  7:41 PM EDT ----- Patient called nurse line for visit for cough to set up appointment; don't see that she got an appointment. Please make sure she is feeling better/offer appointment if needed. ----- Message ----- From: Clarita Leber Sent: 03/01/2021   2:22 PM EDT To: Caren Macadam, MD

## 2021-03-05 ENCOUNTER — Ambulatory Visit (INDEPENDENT_AMBULATORY_CARE_PROVIDER_SITE_OTHER): Payer: 59 | Admitting: Family Medicine

## 2021-03-05 ENCOUNTER — Other Ambulatory Visit: Payer: Self-pay

## 2021-03-05 VITALS — BP 132/80 | HR 97 | Temp 98.1°F | Wt 197.5 lb

## 2021-03-05 DIAGNOSIS — J209 Acute bronchitis, unspecified: Secondary | ICD-10-CM

## 2021-03-05 MED ORDER — HYDROCODONE BIT-HOMATROP MBR 5-1.5 MG/5ML PO SOLN: 5.0000 mL | Freq: Four times a day (QID) | ORAL | 0 refills | Status: DC | PRN

## 2021-03-05 NOTE — Progress Notes (Signed)
Established Patient Office Visit  Subjective:  Patient ID: Morgan Wolfe, female    DOB: [birth year]  Age: 65 y.o. MRN: [id number]  CC:  Chief Complaint  Patient presents with   Nasal Congestion    X 2 weeks, congestion, runny nose, drainage, sob, green mucus, mucinex did not help    HPI Morgan Wolfe presents for persistent cough.  She states that on October 8 she went out with some friends to eat out and she was sitting under a cold air vent.  By the next day she developed some nasal congestion followed by cough.  No fever.  COVID testing negative x2.  She did virtual visit on 11 October.  Was prescribed Tessalon but this did not help her cough.  Cough is fairly severe at night.  She tried over-the-counter Mucinex which has not helped.  Still has some nasal congestion but mostly cough at this time.  Occasional green mucus.  No chronic lung disease.  No sick contacts.  Past Medical History:  Diagnosis Date   ALLERGIC RHINITIS    Hypertension    PONV (postoperative nausea and vomiting)    Right shoulder pain     Past Surgical History:  Procedure Laterality Date   ANKLE ARTHROSCOPY Left 01/03/2019   Procedure: LEFT ANKLE ARTHROSCOPY AND DEBRIDEMENT;  Surgeon: Newt Minion, MD;  Location: [facility];  Service: Orthopedics;  Laterality: Left;   ANKLE SURGERY Left 2000   ELBOW ARTHROSCOPY Right 2019   TOE SURGERY Right 1998   bone spur   VAGINAL HYSTERECTOMY  1980   per Dr. Harrington Challenger for fibroids; no cancer    Family History  Problem Relation Age of Onset   Coronary artery disease Other        1st degress relatives <50   Hypertension Other    Heart attack Brother 61   Hyperlipidemia Other    Glaucoma Mother    Heart failure Mother    Kidney failure Mother    Pancreatic cancer Father    Brain cancer Sister    Stroke Brother 22   Colon cancer Neg Hx    Stomach cancer Neg Hx     Social History   Socioeconomic History   Marital status: Single    Spouse  name: Not on file   Number of children: Not on file   Years of education: Not on file   Highest education level: Not on file  Occupational History   Not on file  Tobacco Use   Smoking status: Never   Smokeless tobacco: Never  Substance and Sexual Activity   Alcohol use: No   Drug use: No   Sexual activity: Not on file  Other Topics Concern   Not on file  Social History Narrative   Not on file   Social Determinants of Health   Financial Resource Strain: Not on file  Food Insecurity: Not on file  Transportation Needs: Not on file  Physical Activity: Not on file  Stress: Not on file  Social Connections: Not on file  Intimate Partner Violence: Not on file    Outpatient Medications Prior to Visit  Medication Sig Dispense Refill   estradiol (ESTRACE) 0.5 MG tablet TAKE 1 TABLET(0.5 MG) BY MOUTH DAILY 90 tablet 1   fluticasone (FLONASE) 50 MCG/ACT nasal spray SHAKE LIQUID AND USE 2 SPRAYS IN EACH NOSTRIL DAILY 16 g 2   loratadine (CLARITIN) 10 MG tablet Take 1 tablet (10 mg total) by mouth daily. 90 tablet  1   meclizine (ANTIVERT) 25 MG tablet Take 1 tablet (25 mg total) by mouth 3 (three) times daily as needed for dizziness. 30 tablet 4   triamterene-hydrochlorothiazide (DYAZIDE) 37.5-25 MG capsule TAKE 1 CAPSULE BY MOUTH EVERY DAY 90 capsule 1   benzonatate (TESSALON PERLES) 100 MG capsule Take 1 capsule (100 mg total) by mouth 3 (three) times daily as needed. 20 capsule 0   No facility-administered medications prior to visit.    Allergies  Allergen Reactions   Augmentin [Amoxicillin-Pot Clavulanate] Nausea And Vomiting    Diarrhea    Sulfonamide Derivatives Itching    ROS Review of Systems  Constitutional:  Negative for chills and fever.  HENT:  Negative for ear pain, sinus pain and sore throat.   Respiratory:  Positive for cough. Negative for shortness of breath and wheezing.   Cardiovascular:  Negative for chest pain.     Objective:    Physical Exam Vitals  reviewed.  Constitutional:      Appearance: Normal appearance.  Cardiovascular:     Rate and Rhythm: Normal rate and regular rhythm.  Pulmonary:     Effort: Pulmonary effort is normal.     Breath sounds: Normal breath sounds. No wheezing or rales.  Musculoskeletal:     Cervical back: Neck supple.  Lymphadenopathy:     Cervical: No cervical adenopathy.  Neurological:     Mental Status: She is alert.    BP 132/80 (BP Location: Left Arm, Patient Position: Sitting, Cuff Size: Normal)   Pulse 97   Temp 98.1 F (36.7 C) (Oral)   Wt 197 lb 8 oz (89.6 kg)   SpO2 99%   BMI 31.88 kg/m  Wt Readings from Last 3 Encounters:  03/05/21 197 lb 8 oz (89.6 kg)  02/25/21 193 lb (87.5 kg)  11/20/20 201 lb 12.8 oz (91.5 kg)     Health Maintenance Due  Topic Date Due   Zoster Vaccines- Shingrix (1 of 2) Never done   COVID-19 Vaccine (2 - Booster for Janssen series) 09/11/2019   TETANUS/TDAP  01/25/2021    There are no preventive care reminders to display for this patient.  Lab Results  Component Value Date   TSH 0.91 01/10/2018   Lab Results  Component Value Date   WBC 7.9 11/20/2020   HGB 12.7 11/20/2020   HCT 37.6 11/20/2020   MCV 83.6 11/20/2020   PLT 316 11/20/2020   Lab Results  Component Value Date   NA 139 11/20/2020   K 3.5 11/20/2020   CO2 30 11/20/2020   GLUCOSE 92 11/20/2020   BUN 10 11/20/2020   CREATININE 0.92 11/20/2020   BILITOT 0.5 11/20/2020   ALKPHOS 79 11/20/2020   AST 13 (L) 11/20/2020   ALT 9 11/20/2020   PROT 8.0 11/20/2020   ALBUMIN 3.7 11/20/2020   CALCIUM 9.8 11/20/2020   ANIONGAP 8 11/20/2020   GFR 73.23 10/28/2020   Lab Results  Component Value Date   CHOL 184 10/28/2020   Lab Results  Component Value Date   HDL 51.50 10/28/2020   Lab Results  Component Value Date   LDLCALC 99 10/28/2020   Lab Results  Component Value Date   TRIG 166.0 (H) 10/28/2020   Lab Results  Component Value Date   CHOLHDL 4 10/28/2020   No results  found for: HGBA1C    Assessment & Plan:   Problem List Items Addressed This Visit   None Visit Diagnoses     Acute bronchitis, unspecified organism    -  Primary     Suspect viral.  Lung exam unremarkable. -Suggested over-the-counter Mucinex 1200 mg twice daily -Stay well-hydrated -Hycodan cough syrup as needed for severe cough.  She is aware this may cause some sedation -Follow-up promptly for any fever or shortness of breath-or if cough not resolving over the next couple weeks  Meds ordered this encounter  Medications   HYDROcodone bit-homatropine (HYCODAN) 5-1.5 MG/5ML syrup    Sig: Take 5 mLs by mouth every 6 (six) hours as needed for cough.    Dispense:  120 mL    Refill:  0    Follow-up: No follow-ups on file.    Carolann Littler, MD

## 2021-03-05 NOTE — Patient Instructions (Signed)
May take up to 1,200 mg Mucinex twice daily  Stay well hydrated  Follow up for any fever or increased shortness of breath.

## 2021-04-30 ENCOUNTER — Ambulatory Visit (INDEPENDENT_AMBULATORY_CARE_PROVIDER_SITE_OTHER): Payer: 59 | Admitting: Family Medicine

## 2021-04-30 ENCOUNTER — Encounter: Payer: Self-pay | Admitting: Family Medicine

## 2021-04-30 VITALS — BP 118/78 | HR 68 | Temp 97.5°F | Ht 67.25 in | Wt 200.4 lb

## 2021-04-30 DIAGNOSIS — J3089 Other allergic rhinitis: Secondary | ICD-10-CM | POA: Diagnosis not present

## 2021-04-30 DIAGNOSIS — Z0000 Encounter for general adult medical examination without abnormal findings: Secondary | ICD-10-CM

## 2021-04-30 DIAGNOSIS — I10 Essential (primary) hypertension: Secondary | ICD-10-CM | POA: Diagnosis not present

## 2021-04-30 MED ORDER — FLUTICASONE PROPIONATE 50 MCG/ACT NA SUSP
NASAL | 5 refills | Status: AC
Start: 2021-04-30 — End: ?

## 2021-04-30 MED ORDER — LORATADINE 10 MG PO TABS: 10.0000 mg | ORAL_TABLET | Freq: Every day | ORAL | 1 refills | Status: DC

## 2021-04-30 MED ORDER — MECLIZINE HCL 25 MG PO TABS: 25.0000 mg | ORAL_TABLET | Freq: Three times a day (TID) | ORAL | 4 refills | Status: DC | PRN

## 2021-04-30 MED ORDER — TRIAMTERENE-HCTZ 37.5-25 MG PO CAPS: 1.0000 | ORAL_CAPSULE | Freq: Every day | ORAL | 1 refills | Status: DC

## 2021-04-30 NOTE — Progress Notes (Signed)
Morgan Wolfe DOB: 1955/08/24 Encounter date: 04/30/2021  This is a 65 y.o. female who presents for complete physical   History of present illness/Additional concerns:  last visit with me was 10/28/20  She had to put pause on dry needling due to cost. It was really helping; better than therapy, but too expensive.   Colonoscopy: due 01/2022.  Mammogram:   HTN: has been "normal" at home. Dyazide 37.5-25mg  daily.   Seasonal allergies: doing ok; flonase and claritin.   No issues with dizziness; but keeps meclizine on hand in case.   Mammogram normal from February.  Doesn't see dermatology.  Hysterectomy done in 1980. Has had pelvic. Doesn't want today.   Past Medical History:  Diagnosis Date   ALLERGIC RHINITIS    Hypertension    PONV (postoperative nausea and vomiting)    Right shoulder pain    Past Surgical History:  Procedure Laterality Date   ANKLE ARTHROSCOPY Left 01/03/2019   Procedure: LEFT ANKLE ARTHROSCOPY AND DEBRIDEMENT;  Surgeon: Newt Minion, MD;  Location: [place];  Service: Orthopedics;  Laterality: Left;   ANKLE SURGERY Left 2000   ELBOW ARTHROSCOPY Right 2019   TOE SURGERY Right 1998   bone spur   VAGINAL HYSTERECTOMY  1980   per Dr. Harrington Challenger for fibroids; no cancer   Allergies  Allergen Reactions   Augmentin [Amoxicillin-Pot Clavulanate] Nausea And Vomiting    Diarrhea    Sulfonamide Derivatives Itching   Current Meds  Medication Sig   estradiol (ESTRACE) 0.5 MG tablet TAKE 1 TABLET(0.5 MG) BY MOUTH DAILY   HYDROcodone bit-homatropine (HYCODAN) 5-1.5 MG/5ML syrup Take 5 mLs by mouth every 6 (six) hours as needed for cough.   [DISCONTINUED] fluticasone (FLONASE) 50 MCG/ACT nasal spray SHAKE LIQUID AND USE 2 SPRAYS IN EACH NOSTRIL DAILY   [DISCONTINUED] loratadine (CLARITIN) 10 MG tablet Take 1 tablet (10 mg total) by mouth daily.   [DISCONTINUED] meclizine (ANTIVERT) 25 MG tablet Take 1 tablet (25 mg total) by mouth 3 (three) times  daily as needed for dizziness.   [DISCONTINUED] triamterene-hydrochlorothiazide (DYAZIDE) 37.5-25 MG capsule TAKE 1 CAPSULE BY MOUTH EVERY DAY   Social History   Tobacco Use   Smoking status: Never   Smokeless tobacco: Never  Substance Use Topics   Alcohol use: No   Family History  Problem Relation Age of Onset   Coronary artery disease Other        1st degress relatives <50   Hypertension Other    Heart attack Brother 34   Hyperlipidemia Other    Glaucoma Mother    Heart failure Mother    Kidney failure Mother    Pancreatic cancer Father    Brain cancer Sister    Stroke Brother 46   Colon cancer Neg Hx    Stomach cancer Neg Hx      Review of Systems  Constitutional:  Negative for activity change, appetite change, chills, fatigue, fever and unexpected weight change.  HENT:  Negative for congestion, ear pain, hearing loss, sinus pressure, sinus pain, sore throat and trouble swallowing.   Eyes:  Negative for pain and visual disturbance.  Respiratory:  Negative for cough, chest tightness, shortness of breath and wheezing.   Cardiovascular:  Negative for chest pain, palpitations and leg swelling.  Gastrointestinal:  Negative for abdominal pain, blood in stool, constipation, diarrhea, nausea and vomiting.  Genitourinary:  Negative for difficulty urinating and menstrual problem.  Musculoskeletal:  Positive for arthralgias (shoulder) and back pain (was doing  well with dry needling; now having harder time).  Skin:  Negative for rash.  Neurological:  Negative for dizziness (hasn't had recent episodes of dizziness), weakness, numbness and headaches.  Hematological:  Negative for adenopathy. Does not bruise/bleed easily.  Psychiatric/Behavioral:  Negative for sleep disturbance and suicidal ideas. The patient is not nervous/anxious.    CBC:  Lab Results  Component Value Date   WBC 7.9 11/20/2020   WBC 7.4 10/28/2020   HGB 12.7 11/20/2020   HCT 37.6 11/20/2020   MCH 28.2  11/20/2020   MCHC 33.8 11/20/2020   RDW 12.8 11/20/2020   PLT 316 11/20/2020   CMP: Lab Results  Component Value Date   NA 139 11/20/2020   K 3.5 11/20/2020   CL 101 11/20/2020   CO2 30 11/20/2020   ANIONGAP 8 11/20/2020   GLUCOSE 92 11/20/2020   GLUCOSE 95 03/02/2006   BUN 10 11/20/2020   CREATININE 0.92 11/20/2020   CREATININE 0.83 01/08/2020   GFRAA >60 01/02/2019   CALCIUM 9.8 11/20/2020   PROT 8.0 11/20/2020   BILITOT 0.5 11/20/2020   ALKPHOS 79 11/20/2020   ALT 9 11/20/2020   AST 13 (L) 11/20/2020   LIPID: Lab Results  Component Value Date   CHOL 184 10/28/2020   TRIG 166.0 (H) 10/28/2020   TRIG 174 (H) 03/02/2006   HDL 51.50 10/28/2020   LDLCALC 99 10/28/2020    Objective:  BP 118/78 (BP Location: Left Arm, Patient Position: Sitting, Cuff Size: Large)    Pulse 68    Temp (!) 97.5 F (36.4 C) (Oral)    Ht 5' 7.25" (1.708 m)    Wt 200 lb 6.4 oz (90.9 kg)    SpO2 99%    BMI 31.15 kg/m   Weight: 200 lb 6.4 oz (90.9 kg)   BP Readings from Last 3 Encounters:  04/30/21 118/78  03/05/21 132/80  11/20/20 117/76   Wt Readings from Last 3 Encounters:  04/30/21 200 lb 6.4 oz (90.9 kg)  03/05/21 197 lb 8 oz (89.6 kg)  02/25/21 193 lb (87.5 kg)    Physical Exam Constitutional:      General: She is not in acute distress.    Appearance: She is well-developed.  HENT:     Head: Normocephalic and atraumatic.     Right Ear: External ear normal.     Left Ear: External ear normal.     Mouth/Throat:     Pharynx: No oropharyngeal exudate.  Eyes:     Conjunctiva/sclera: Conjunctivae normal.     Pupils: Pupils are equal, round, and reactive to light.  Neck:     Thyroid: No thyromegaly.  Cardiovascular:     Rate and Rhythm: Normal rate and regular rhythm.     Heart sounds: Normal heart sounds. No murmur heard.   No friction rub. No gallop.  Pulmonary:     Effort: Pulmonary effort is normal.     Breath sounds: Normal breath sounds.  Abdominal:     General: Bowel  sounds are normal. There is no distension.     Palpations: Abdomen is soft. There is no mass.     Tenderness: There is no abdominal tenderness. There is no guarding.     Hernia: No hernia is present.  Musculoskeletal:        General: No tenderness or deformity. Normal range of motion.     Cervical back: Normal range of motion and neck supple.  Lymphadenopathy:     Cervical: No cervical adenopathy.  Skin:  General: Skin is warm and dry.     Findings: No rash.  Neurological:     Mental Status: She is alert and oriented to person, place, and time.     Deep Tendon Reflexes: Reflexes normal.     Reflex Scores:      Tricep reflexes are 2+ on the right side and 2+ on the left side.      Bicep reflexes are 2+ on the right side and 2+ on the left side.      Brachioradialis reflexes are 2+ on the right side and 2+ on the left side.      Patellar reflexes are 2+ on the right side and 2+ on the left side. Psychiatric:        Speech: Speech normal.        Behavior: Behavior normal.        Thought Content: Thought content normal.    Assessment/Plan: There are no preventive care reminders to display for this patient.  Health Maintenance reviewed - she is due for Tdap, covid booster, shingrix. She does not want to get these today, but will complete after upcoming trip.  1. Preventative health care See above. UTD. Keep up with regular exercise. Discussed free yoga at [employer] rec which may be helpful for back.  2. Essential hypertension Well controlled. Continue with current medications. - triamterene-hydrochlorothiazide (DYAZIDE) 37.5-25 MG capsule; Take 1 each (1 capsule total) by mouth daily.  Dispense: 90 capsule; Refill: 1  3. Allergic rhinitis due to fungal spores, unspecified seasonality Controlled with flonase, claritin. - triamterene-hydrochlorothiazide (DYAZIDE) 37.5-25 MG capsule; Take 1 each (1 capsule total) by mouth daily.  Dispense: 90 capsule; Refill: 1 - loratadine  (CLARITIN) 10 MG tablet; Take 1 tablet (10 mg total) by mouth daily.  Dispense: 90 tablet; Refill: 1 - fluticasone (FLONASE) 50 MCG/ACT nasal spray; SHAKE LIQUID AND USE 2 SPRAYS IN EACH NOSTRIL DAILY  Dispense: 16 g; Refill: 5  Return in about 6 months (around 10/29/2021) for Chronic condition visit.  Micheline Rough, MD

## 2021-04-30 NOTE — Patient Instructions (Addendum)
Can get covid booster at pharmacy along with Tdap.  Get shingrix after you get the above immunizations.  Adult Exercise Classes   [employer]: [place].gov [phone]) All classes are free, but you have to register. Zoom classes are offered. Variety includes yoga, chair yoga, arthritis classes, Tai Chi, line dancing, balance classes, boot camp, and more!   PBS  Daily chair exercises/Yoga classes

## 2021-05-29 ENCOUNTER — Inpatient Hospital Stay: Payer: PRIVATE HEALTH INSURANCE | Attending: Internal Medicine

## 2021-05-29 ENCOUNTER — Inpatient Hospital Stay (HOSPITAL_BASED_OUTPATIENT_CLINIC_OR_DEPARTMENT_OTHER): Payer: PRIVATE HEALTH INSURANCE | Admitting: Internal Medicine

## 2021-05-29 ENCOUNTER — Other Ambulatory Visit: Payer: Self-pay

## 2021-05-29 VITALS — BP 123/74 | HR 65 | Temp 97.1°F | Resp 18 | Ht 67.25 in | Wt 198.1 lb

## 2021-05-29 DIAGNOSIS — D72820 Lymphocytosis (symptomatic): Secondary | ICD-10-CM

## 2021-05-29 LAB — CMP (CANCER CENTER ONLY)
ALT: 12 U/L (ref 0–44)
AST: 14 U/L — ABNORMAL LOW (ref 15–41)
Albumin: 4 g/dL (ref 3.5–5.0)
Alkaline Phosphatase: 72 U/L (ref 38–126)
Anion gap: 8 (ref 5–15)
BUN: 12 mg/dL (ref 8–23)
CO2: 32 mmol/L (ref 22–32)
Calcium: 9.3 mg/dL (ref 8.9–10.3)
Chloride: 99 mmol/L (ref 98–111)
Creatinine: 0.98 mg/dL (ref 0.44–1.00)
GFR, Estimated: >60 mL/min (ref >60)
Glucose, Bld: 104 mg/dL — ABNORMAL HIGH (ref 70–99)
Potassium: 3.3 mmol/L — ABNORMAL LOW (ref 3.5–5.1)
Sodium: 139 mmol/L (ref 135–145)
Total Bilirubin: 0.5 mg/dL (ref 0.3–1.2)
Total Protein: 7.6 g/dL (ref 6.5–8.1)

## 2021-05-29 LAB — CBC WITH DIFFERENTIAL (CANCER CENTER ONLY)
Abs Immature Granulocytes: 0.01 10*3/uL (ref 0.00–0.07)
Basophils Absolute: 0.1 10*3/uL (ref 0.0–0.1)
Basophils Relative: 1 %
Eosinophils Absolute: 0.2 10*3/uL (ref 0.0–0.5)
Eosinophils Relative: 2 %
HCT: 38.3 % (ref 36.0–46.0)
Hemoglobin: 12.9 g/dL (ref 12.0–15.0)
Immature Granulocytes: 0 %
Lymphocytes Relative: 58 %
Lymphs Abs: 4.6 10*3/uL — ABNORMAL HIGH (ref 0.7–4.0)
MCH: 28 pg (ref 26.0–34.0)
MCHC: 33.7 g/dL (ref 30.0–36.0)
MCV: 83.1 fL (ref 80.0–100.0)
Monocytes Absolute: 0.4 10*3/uL (ref 0.1–1.0)
Monocytes Relative: 6 %
Neutro Abs: 2.6 10*3/uL (ref 1.7–7.7)
Neutrophils Relative %: 33 %
Platelet Count: 311 10*3/uL (ref 150–400)
RBC: 4.61 MIL/uL (ref 3.87–5.11)
RDW: 13.2 % (ref 11.5–15.5)
WBC Count: 7.9 10*3/uL (ref 4.0–10.5)
nRBC: 0 % (ref 0.0–0.2)

## 2021-05-29 LAB — LACTATE DEHYDROGENASE: LDH: 130 U/L (ref 98–192)

## 2021-05-29 NOTE — Progress Notes (Signed)
[facility] [phone]   [phone]  OFFICE PROGRESS NOTE  Caren Macadam, MD [place] [address]  DIAGNOSIS: Lymphocytosis, likely reactive in nature.  Flow cytometry was negative for CLL.  PRIOR THERAPY: None  CURRENT THERAPY: Observation.  INTERVAL HISTORY: Morgan Wolfe 66 y.o. female returns to the clinic today for follow-up visit.  The patient is feeling fine today with no concerning complaints.  She has no recent weight loss or night sweats.  She has no palpable lymphadenopathy.  She has no nausea, vomiting, diarrhea or constipation.  She has no chest pain, shortness of breath, cough or hemoptysis.  She has no headache or visual changes.  She had flow cytometry that was unremarkable for any myeloproliferative abnormality or monoclonal population of lymphocytes.  She is here today for evaluation and repeat blood work.   MEDICAL HISTORY: Past Medical History:  Diagnosis Date   ALLERGIC RHINITIS    Hypertension    PONV (postoperative nausea and vomiting)    Right shoulder pain     ALLERGIES:  is allergic to augmentin [amoxicillin-pot clavulanate] and sulfonamide derivatives.  MEDICATIONS:  Current Outpatient Medications  Medication Sig Dispense Refill   estradiol (ESTRACE) 0.5 MG tablet TAKE 1 TABLET(0.5 MG) BY MOUTH DAILY 90 tablet 1   fluticasone (FLONASE) 50 MCG/ACT nasal spray SHAKE LIQUID AND USE 2 SPRAYS IN EACH NOSTRIL DAILY 16 g 5   HYDROcodone bit-homatropine (HYCODAN) 5-1.5 MG/5ML syrup Take 5 mLs by mouth every 6 (six) hours as needed for cough. 120 mL 0   loratadine (CLARITIN) 10 MG tablet Take 1 tablet (10 mg total) by mouth daily. 90 tablet 1   meclizine (ANTIVERT) 25 MG tablet Take 1 tablet (25 mg total) by mouth 3 (three) times daily as needed for dizziness. 30 tablet 4   triamterene-hydrochlorothiazide (DYAZIDE) 37.5-25 MG capsule Take 1 each (1 capsule total) by mouth daily. 90 capsule  1   No current facility-administered medications for this visit.    SURGICAL HISTORY:  Past Surgical History:  Procedure Laterality Date   ANKLE ARTHROSCOPY Left 01/03/2019   Procedure: LEFT ANKLE ARTHROSCOPY AND DEBRIDEMENT;  Surgeon: Newt Minion, MD;  Location: [facility];  Service: Orthopedics;  Laterality: Left;   ANKLE SURGERY Left 2000   ELBOW ARTHROSCOPY Right 2019   TOE SURGERY Right 1998   bone spur   VAGINAL HYSTERECTOMY  1980   per Dr. Harrington Challenger for fibroids; no cancer    REVIEW OF SYSTEMS:  A comprehensive review of systems was negative.   PHYSICAL EXAMINATION: General appearance: alert, cooperative, and no distress Head: Normocephalic, without obvious abnormality, atraumatic Neck: no adenopathy, no JVD, supple, symmetrical, trachea midline, and thyroid not enlarged, symmetric, no tenderness/mass/nodules Lymph nodes: Cervical, supraclavicular, and axillary nodes normal. Resp: clear to auscultation bilaterally Back: symmetric, no curvature. ROM normal. No CVA tenderness. Cardio: regular rate and rhythm, S1, S2 normal, no murmur, click, rub or gallop GI: soft, non-tender; bowel sounds normal; no masses,  no organomegaly Extremities: extremities normal, atraumatic, no cyanosis or edema  ECOG PERFORMANCE STATUS: 0 - Asymptomatic  Blood pressure 123/74, pulse 65, temperature (!) 97.1 F (36.2 C), temperature source Tympanic, resp. rate 18, height 5' 7.25" (1.708 m), weight 198 lb 1.6 oz (89.9 kg), SpO2 99 %.  LABORATORY DATA: Lab Results  Component Value Date   WBC 7.9 05/29/2021   HGB 12.9 05/29/2021   HCT 38.3 05/29/2021   MCV 83.1 05/29/2021  PLT 311 05/29/2021      Chemistry      Component Value Date/Time   NA 139 11/20/2020 1152   K 3.5 11/20/2020 1152   CL 101 11/20/2020 1152   CO2 30 11/20/2020 1152   BUN 10 11/20/2020 1152   CREATININE 0.92 11/20/2020 1152   CREATININE 0.83 01/08/2020 0736      Component Value Date/Time   CALCIUM  9.8 11/20/2020 1152   ALKPHOS 79 11/20/2020 1152   AST 13 (L) 11/20/2020 1152   ALT 9 11/20/2020 1152   BILITOT 0.5 11/20/2020 1152       RADIOGRAPHIC STUDIES: No results found.  ASSESSMENT AND PLAN: This is a very pleasant 66 years old African-American female with mild lymphocytosis likely reactive in nature.  The patient had flow cytometry on November 20, 2020 that showed predominance of T lymphocytes with no monoclonal B cell population. Also the absolute lymphocyte count does not meet the criteria for CLL. I recommended for the patient to continue on observation with routine follow-up visit by her primary care physician at this point.  I will be happy to see the patient in the future if she has any other concerning hematologic abnormality. She was advised to call if she has any concerning symptoms. The patient voices understanding of current disease status and treatment options and is in agreement with the current care plan.  All questions were answered. The patient knows to call the clinic with any problems, questions or concerns. We can certainly see the patient much sooner if necessary.  The total time spent in the appointment was 20 minutes.  Disclaimer: This note was dictated with voice recognition software. Similar sounding words can inadvertently be transcribed and may not be corrected upon review.

## 2021-07-07 ENCOUNTER — Other Ambulatory Visit: Payer: Self-pay | Admitting: Family Medicine

## 2021-07-07 DIAGNOSIS — Z1231 Encounter for screening mammogram for malignant neoplasm of breast: Secondary | ICD-10-CM

## 2021-07-08 ENCOUNTER — Ambulatory Visit
Admission: RE | Admit: 2021-07-08 | Discharge: 2021-07-08 | Disposition: A | Discharge status: Home / Self Care | Payer: Medicare HMO | Source: Ambulatory Visit | Attending: Family Medicine | Admitting: Family Medicine

## 2021-07-08 DIAGNOSIS — Z1231 Encounter for screening mammogram for malignant neoplasm of breast: Secondary | ICD-10-CM

## 2021-07-14 ENCOUNTER — Telehealth: Payer: Self-pay | Admitting: Family Medicine

## 2021-07-14 NOTE — Telephone Encounter (Signed)
Patient called in requesting to speak with someone about her triamterene-hydrochlorothiazide (DYAZIDE) 37.5-25 MG capsule [[id number]] and estradiol (ESTRACE) 0.5 MG tablet [[id number]] .  Please advise

## 2021-07-14 NOTE — Telephone Encounter (Signed)
Spoke with the patient and she stated she has noticed dizziness, lightheadedness, headache, blurred vision for some time and questioned if this was related to Triamterene-HCTZ.  Patient stated she discontinued this 2 days ago and symptoms resolved.  Questioned if she has checked her blood pressure at home-patient stated "it was good and cannot recall the actual numbers".  I informed the patient an appt is needed for evaluation and scheduled a visit with Dr Elease Hashimoto on 2/28.  Patient wanted to know if Dr Ethlyn Gallery would change Estradiol tablets to cream instead?  Message sent to PCP.

## 2021-07-15 ENCOUNTER — Ambulatory Visit (INDEPENDENT_AMBULATORY_CARE_PROVIDER_SITE_OTHER): Payer: Medicare HMO | Admitting: Family Medicine

## 2021-07-15 VITALS — BP 130/78 | HR 90 | Temp 98.3°F | Ht 67.25 in | Wt 208.3 lb

## 2021-07-15 DIAGNOSIS — R519 Headache, unspecified: Secondary | ICD-10-CM

## 2021-07-15 DIAGNOSIS — N952 Postmenopausal atrophic vaginitis: Secondary | ICD-10-CM

## 2021-07-15 DIAGNOSIS — I10 Essential (primary) hypertension: Secondary | ICD-10-CM | POA: Diagnosis not present

## 2021-07-15 MED ORDER — ESTRADIOL 0.1 MG/GM VA CREA: TOPICAL_CREAM | VAGINAL | 11 refills | Status: DC

## 2021-07-15 NOTE — Progress Notes (Signed)
Established Patient Office Visit  Subjective:  Patient ID: Morgan Wolfe, female    DOB: 1955-07-08  Age: 66 y.o. MRN: [id number]  CC:  Chief Complaint  Patient presents with   Headache    X2 weeks, states she discontinued Triamterene/HCTZ 4 days ago   Blurred Vision    X2 weeks   Dizziness    X2 weeks    HPI Morgan Wolfe presents with complaints over the past couple weeks of some bilateral frontal headaches, bilateral blurred vision, dizziness.  Patient has history of hypertension and has been on Dyazide for several years.  She stopped this about 4 days ago thinking this was related to her blood pressure.  She did have some lightheadedness.  No syncope.  Symptoms have improved some since stopping medication.  Her headaches are some better.  Denies any diplopia, slurred speech, focal weakness, chest pains, dyspnea, fever, chills.  No alcohol use.  She tries to watch sodium intake closely.  She is on oral estrogen tablets and is inquiring about coming off this because of some risks she has heard about recently.  She would like to consider topical estrogen.  Past Medical History:  Diagnosis Date   ALLERGIC RHINITIS    Hypertension    PONV (postoperative nausea and vomiting)    Right shoulder pain     Past Surgical History:  Procedure Laterality Date   ANKLE ARTHROSCOPY Left 01/03/2019   Procedure: LEFT ANKLE ARTHROSCOPY AND DEBRIDEMENT;  Surgeon: Newt Minion, MD;  Location: [facility];  Service: Orthopedics;  Laterality: Left;   ANKLE SURGERY Left 2000   ELBOW ARTHROSCOPY Right 2019   TOE SURGERY Right 1998   bone spur   VAGINAL HYSTERECTOMY  1980   per Dr. Harrington Challenger for fibroids; no cancer    Family History  Problem Relation Age of Onset   Glaucoma Mother    Heart failure Mother    Kidney failure Mother    Pancreatic cancer Father    Breast cancer Sister    Brain cancer Sister    Heart attack Brother 32   Stroke Brother 60   Coronary artery disease  Other        1st degress relatives <50   Hypertension Other    Hyperlipidemia Other    Colon cancer Neg Hx    Stomach cancer Neg Hx     Social History   Socioeconomic History   Marital status: Single    Spouse name: Not on file   Number of children: Not on file   Years of education: Not on file   Highest education level: Some college, no degree  Occupational History   Not on file  Tobacco Use   Smoking status: Never   Smokeless tobacco: Never  Substance and Sexual Activity   Alcohol use: No   Drug use: No   Sexual activity: Not on file  Other Topics Concern   Not on file  Social History Narrative   Not on file   Social Determinants of Health   Financial Resource Strain: Low Risk    Difficulty of Paying Living Expenses: Not hard at all  Food Insecurity: No Food Insecurity   Worried About Charity fundraiser in the Last Year: Never true   [place] in the Last Year: Never true  Transportation Needs: No Transportation Needs   Lack of Transportation (Medical): No   Lack of Transportation (Non-Medical): No  Physical Activity: Sufficiently Active   Days  of Exercise per Week: 7 days   Minutes of Exercise per Session: 30 min  Stress: No Stress Concern Present   Feeling of Stress : Not at all  Social Connections: Moderately Integrated   Frequency of Communication with Friends and Family: More than three times a week   Frequency of Social Gatherings with Friends and Family: Once a week   Attends Religious Services: More than 4 times per year   Active Member of [employer] or Organizations: Yes   Attends Archivist Meetings: 1 to 4 times per year   Marital Status: Never married  Human resources officer Violence: Not on file    Outpatient Medications Prior to Visit  Medication Sig Dispense Refill   fluticasone (FLONASE) 50 MCG/ACT nasal spray SHAKE LIQUID AND USE 2 SPRAYS IN EACH NOSTRIL DAILY 16 g 5   loratadine (CLARITIN) 10 MG tablet Take 1 tablet (10 mg total) by  mouth daily. 90 tablet 1   meclizine (ANTIVERT) 25 MG tablet Take 1 tablet (25 mg total) by mouth 3 (three) times daily as needed for dizziness. 30 tablet 4   triamterene-hydrochlorothiazide (DYAZIDE) 37.5-25 MG capsule Take 1 each (1 capsule total) by mouth daily. 90 capsule 1   estradiol (ESTRACE) 0.5 MG tablet TAKE 1 TABLET(0.5 MG) BY MOUTH DAILY 90 tablet 1   HYDROcodone bit-homatropine (HYCODAN) 5-1.5 MG/5ML syrup Take 5 mLs by mouth every 6 (six) hours as needed for cough. 120 mL 0   No facility-administered medications prior to visit.    Allergies  Allergen Reactions   Augmentin [Amoxicillin-Pot Clavulanate] Nausea And Vomiting    Diarrhea    Sulfonamide Derivatives Itching    ROS Review of Systems  Constitutional:  Negative for chills and fever.  Eyes:        See HPI  Respiratory:  Negative for shortness of breath.   Cardiovascular:  Negative for chest pain.  Neurological:        See HPI.  Headaches resolved at this time.     Objective:    Physical Exam Vitals reviewed.  Constitutional:      Appearance: She is well-developed.  Cardiovascular:     Rate and Rhythm: Normal rate and regular rhythm.  Pulmonary:     Effort: Pulmonary effort is normal.     Breath sounds: Normal breath sounds.  Musculoskeletal:        General: No swelling.  Neurological:     Mental Status: She is alert and oriented to person, place, and time.     Cranial Nerves: No cranial nerve deficit.     Motor: No weakness.     Gait: Gait normal.    BP 130/78 (BP Location: Left Arm, Cuff Size: Normal)    Pulse 90    Temp 98.3 F (36.8 C) (Oral)    Ht 5' 7.25" (1.708 m)    Wt 208 lb 4.8 oz (94.5 kg)    SpO2 98%    BMI 32.38 kg/m  Wt Readings from Last 3 Encounters:  07/15/21 208 lb 4.8 oz (94.5 kg)  05/29/21 198 lb 1.6 oz (89.9 kg)  04/30/21 200 lb 6.4 oz (90.9 kg)     Health Maintenance Due  Topic Date Due   Zoster Vaccines- Shingrix (1 of 2) Never done   COVID-19 Vaccine (2 - Booster  for Janssen series) 09/11/2019   TETANUS/TDAP  01/25/2021    There are no preventive care reminders to display for this patient.  Lab Results  Component Value Date   TSH  0.91 01/10/2018   Lab Results  Component Value Date   WBC 7.9 05/29/2021   HGB 12.9 05/29/2021   HCT 38.3 05/29/2021   MCV 83.1 05/29/2021   PLT 311 05/29/2021   Lab Results  Component Value Date   NA 139 05/29/2021   K 3.3 (L) 05/29/2021   CO2 32 05/29/2021   GLUCOSE 104 (H) 05/29/2021   BUN 12 05/29/2021   CREATININE 0.98 05/29/2021   BILITOT 0.5 05/29/2021   ALKPHOS 72 05/29/2021   AST 14 (L) 05/29/2021   ALT 12 05/29/2021   PROT 7.6 05/29/2021   ALBUMIN 4.0 05/29/2021   CALCIUM 9.3 05/29/2021   ANIONGAP 8 05/29/2021   GFR 73.23 10/28/2020   Lab Results  Component Value Date   CHOL 184 10/28/2020   Lab Results  Component Value Date   HDL 51.50 10/28/2020   Lab Results  Component Value Date   LDLCALC 99 10/28/2020   Lab Results  Component Value Date   TRIG 166.0 (H) 10/28/2020   Lab Results  Component Value Date   CHOLHDL 4 10/28/2020   No results found for: HGBA1C    Assessment & Plan:   #1 hypertension.  Patient off Dyazide for about 4 days.  Repeat blood pressure left arm seated after rest 130/80.  We have recommended observation for now and follow-up with primary if consistent readings up over 140/90.  Patient does have concerns about possible side effects with Dyazide.  She has been hypokalemic in the past multiple times.  Consider other options such as low-dose calcium channel blocker such as amlodipine if blood pressure start to climb.  We also discussed other nonpharmacologic measures to control blood pressure with weight control and keeping sodium intake down.  #2 postmenopause.  Patient has had some vaginal symptoms.  Requesting Estrace vaginal.  We recommend stopping oral estrogen.  We gave her option of discussing this further with her primary but she wished to go ahead and  stop that.  We did agree to sending in Estrace vaginal to use 1 applicator per vagina daily for 2 weeks and then 1 applicator per vagina 2-3 times weekly  #3 recent headache and blurred vision.  Symptoms not clear.  Nonfocal neuro exam at this time.  No history of diabetes.  Follow-up immediately with her primary for any recurrent headache or other concerning symptoms  Meds ordered this encounter  Medications   estradiol (ESTRACE VAGINAL) 0.1 MG/GM vaginal cream    Sig: Apply one applicator per vagina daily for 2 weeks and then one applicator per vagina three times weekly    Dispense:  42.5 g    Refill:  11    Follow-up: Return in about 1 month (around 08/12/2021).    Carolann Littler, MD

## 2021-07-15 NOTE — Patient Instructions (Signed)
STOP the Estrace tablets  May start the topical estrogen cream  Monitor blood pressure and be in touch if consistently > 140/90.

## 2021-07-16 ENCOUNTER — Other Ambulatory Visit: Payer: Self-pay | Admitting: Family Medicine

## 2021-07-16 NOTE — Telephone Encounter (Signed)
Dr. Elease Hashimoto took care of this ?

## 2021-07-31 LAB — COLOGUARD: COLOGUARD: NEGATIVE

## 2021-09-18 ENCOUNTER — Ambulatory Visit: Payer: PRIVATE HEALTH INSURANCE | Attending: Family Medicine

## 2021-09-18 ENCOUNTER — Ambulatory Visit: Payer: Medicare HMO | Admitting: Rehabilitative and Restorative Service Providers"

## 2021-09-18 ENCOUNTER — Other Ambulatory Visit: Payer: Self-pay

## 2021-09-18 DIAGNOSIS — M6281 Muscle weakness (generalized): Secondary | ICD-10-CM | POA: Insufficient documentation

## 2021-09-18 DIAGNOSIS — M5459 Other low back pain: Secondary | ICD-10-CM | POA: Diagnosis present

## 2021-09-18 DIAGNOSIS — M542 Cervicalgia: Secondary | ICD-10-CM | POA: Diagnosis present

## 2021-09-18 NOTE — Therapy (Signed)
?OUTPATIENT PHYSICAL THERAPY CERVICAL EVALUATION ? ? ?Patient Name: Morgan Wolfe ?MRN: [id number] ?DOB:01-29-1956, 66 y.o., female ?Today's Date: 09/18/2021 ? ? PT End of Session - 09/18/21 1612   ? ? Visit Number 1   ? Number of Visits 17   ? Date for PT Re-Evaluation 11/20/21   ? Authorization Type [employer] MCR   ? PT Start Time 1532   ? PT Stop Time 7673   ? PT Time Calculation (min) 41 min   ? Activity Tolerance Patient tolerated treatment well   ? Behavior During Therapy [facility] for tasks assessed/performed   ? ?  ?  ? ?  ? ? ?Past Medical History:  ?Diagnosis Date  ? ALLERGIC RHINITIS   ? Hypertension   ? PONV (postoperative nausea and vomiting)   ? Right shoulder pain   ? ?Past Surgical History:  ?Procedure Laterality Date  ? ANKLE ARTHROSCOPY Left 01/03/2019  ? Procedure: LEFT ANKLE ARTHROSCOPY AND DEBRIDEMENT;  Surgeon: Newt Minion, MD;  Location: [place];  Service: Orthopedics;  Laterality: Left;  ? ANKLE SURGERY Left 2000  ? ELBOW ARTHROSCOPY Right 2019  ? TOE SURGERY Right 1998  ? bone spur  ? VAGINAL HYSTERECTOMY  1980  ? per Dr. Harrington Challenger for fibroids; no cancer  ? ?Patient Active Problem List  ? Diagnosis Date Noted  ? Lymphocytosis 11/20/2020  ? Impingement syndrome of left ankle 01/11/2018  ? Lateral epicondylitis (tennis elbow) 08/31/2016  ? Essential hypertension 12/06/2006  ? Allergic rhinitis 12/06/2006  ? ? ?PCP: Caren Macadam, MD ? ?REFERRING PROVIDER: Susa Day, MD ? ?REFERRING DIAG: M54.2 (ICD-10-CM) - Cervicalgia ? ?THERAPY DIAG:  ?Cervicalgia ? ?Muscle weakness (generalized) ? ?Other low back pain ? ?ONSET DATE: 1 year ago ? ?SUBJECTIVE:                                                                                                                                                                                                        ? ?SUBJECTIVE STATEMENT: ?Pt reports primary c/o chronic Rt cervical/ upper trap pain of insidious onset lasting about 1 year. She also  complains of chronic LBP lasting about 2 years, although she reports her neck pain is the primary problem. She reports Rt-sided headaches that are associated with an exacerbation of her neck pain that occur a few times per month. She denies any N/T associated with this problem.  Current pain is 6/10. Worst is 10/10. Best is 5/10. Aggravating factors include cervical movements, walking/ standing >15 minutes, laying on Rt side, laying supine. Easing factors  include Alieve, heating pad.  ? ?PERTINENT HISTORY:  ?HTN ? ?PAIN:  ?Are you having pain? Yes: NPRS scale: 6/10 ?Pain location: Rt neck/ upper trap ?Pain description: Hervey Ard ?Aggravating factors: cervical movements, walking/ standing >15 minutes, laying on Rt side, laying supine ?Relieving factors: Alieve, heating pad ? ?PRECAUTIONS: None ? ?WEIGHT BEARING RESTRICTIONS No ? ?FALLS:  ?Has patient fallen in last 6 months? No ? ?LIVING ENVIRONMENT: ?Lives with: lives alone ?Lives in: House/apartment ?Stairs: Yes: External: 3 steps; on right going up, on left going up, and can reach both ?Has following equipment at home: None ? ?OCCUPATION: Retired, on disability ? ?PLOF: Independent ? ?PATIENT GOALS Driving without limitation, shopping, decrease pain ? ?OBJECTIVE:  ? ?DIAGNOSTIC FINDINGS:  ?None related to primary problem ? ?PATIENT SURVEYS:  ?FOTO Administer at first treatment due to [place] not being set up for eval ? ? ?COGNITION: ?Overall cognitive status: Within functional limits for tasks assessed ? ? ?SENSATION: ?Not tested ? ?POSTURE:  ?Forward BIL shoulders/ head ? ?PALPATION: ?TTP with palpable trigger points in BIL upper and mid traps/ rhomboids and BIL cervical paraspinals ? ?PASSIVE ACCESSORIES: ?Hypomobile and painful CPAs C3-T8  ? ?CERVICAL ROM:  ? ?Active ROM AROM (deg) ?09/18/2021  ?Flexion 60  ?Extension 40p!  ?Right lateral flexion 40  ?Left lateral flexion 40  ?Right rotation 72  ?Left rotation 68  ? (Blank rows = not tested) ? ?UE ROM: ? ?Active/Passive  ROM Right ?09/18/2021 Left ?09/18/2021  ?Shoulder flexion 135p!/ 170 WNL  ?Shoulder abduction 145p! And audible click/ 093 WNL  ?Shoulder internal rotation 75 minor p!/ 90p! WNL  ?Shoulder external rotation 85/ 105 WNL  ? (Blank rows = not tested) ? ?UE MMT: ? ?MMT Right ?09/18/2021 Left ?09/18/2021  ?Shoulder flexion 5/5 5/5  ?Shoulder extension 5/5 5/5  ?Shoulder abduction 5/5 5/5  ?Shoulder ER 4/5p! 5/5  ?Shoulder IR 5/5 5/5  ?Latissimus dorsi 3+/5 4/5  ?Mid trap 3/5p! 4/5  ?Low trap 3/5 3+/5  ? (Blank rows = not tested) ? ?CERVICAL SPECIAL TESTS:  ?Cervical quadrant testing: (+) to Rt ?Neer's: (-) on Rt ?Hawkin's-Kennedy: (+) on Rt ? ? ?FUNCTIONAL TESTS:  ?DNF endurance testing: 30 seconds ? ? ?TODAY'S TREATMENT:  ?[facility] Adult PT Treatment:                                                DATE: 09/18/2021 ?Therapeutic Exercise: ?N/A ?Manual Therapy: ?Prone CT junction grade V manipulation x1 BIL with cavitation ?Prone thoracic grade V manipulation x5 throughout thoracic spine with cavitation ?Neuromuscular re-ed: ?N/A ?Therapeutic Activity: ?N/A ?Modalities: ?N/A ?Self Care: ?N/A ? ? ? ?PATIENT EDUCATION:  ?Education details: Pt educated on potential underlying pathophysiology behind her pain presentation, POC, prognosis, and HEP ?Person educated: Patient ?Education method: Explanation, Demonstration, and Handouts ?Education comprehension: verbalized understanding and returned demonstration ? ? ?HOME EXERCISE PROGRAM: ?Access Code: [email or web address] ?URL: [email or web address] ?Date: 09/18/2021 ?Prepared by: Vanessa Giltner ? ?Exercises ?- Seated Cervical Sidebending Stretch  - 1 x daily - 7 x weekly - 2-min hold ?- Standing Shoulder Row with Anchored Resistance  - 1 x daily - 7 x weekly - 3 sets - 10 reps - 3-sec hold ?- Seated Cervical Retraction  - 1 x daily - 7 x weekly - 3 sets - 10 reps - 5-sec hold ?- Cervical Extension AROM with Strap  - 1 x daily -  7 x weekly - 10 reps - 10-sec hold ? ?ASSESSMENT: ? ?CLINICAL  IMPRESSION: ?Patient is a 66 y.o. F who was seen today for physical therapy evaluation and treatment for chronic Rt-sided neck pain and upper trap pain.  Upon assessment, pt's primary impairments include weak deep neck flexors, weak Rt>Lt global parascapular strength, weak and painful Rt shoulder ER MMT, painful and limited cervical extension AROM, TTP with palpable trigger points to BIL mid and upper traps/ rhomboids, as well as cervical paraspinals, and hypomobile and painful cervical and thoracic passive accessory mobility. Ruling up cervical facet dysfunction/ mechanical neck pain with associated headaches due to painful cervical extension, positive quadrant testing to the Rt, subjective report of pain and headaches with cervical movements, and unilateral pain presentation. Pt will benefit from skilled PT to address her primary impairments and return to her prior level of function with less limitation. ? ? ?OBJECTIVE IMPAIRMENTS decreased ROM, decreased strength, hypomobility, impaired flexibility, impaired UE functional use, improper body mechanics, postural dysfunction, and pain.  ? ?ACTIVITY LIMITATIONS cleaning, community activity, driving, laundry, yard work, and shopping.  ? ?PERSONAL FACTORS Age, Time since onset of injury/illness/exacerbation, and 1 comorbidity: HTN  are also affecting patient's functional outcome.  ? ? ?REHAB POTENTIAL: Good ? ?CLINICAL DECISION MAKING: Stable/uncomplicated ? ?EVALUATION COMPLEXITY: Low ? ? ?GOALS: ?Goals reviewed with patient? Yes ? ?SHORT TERM GOALS: Target date: 10/16/2021 ? ?Pt will report understanding and adherence to her HEP in order to promote independence in the management of her primary impairments. ?Baseline: HEP provided at eval ?Goal status: INITIAL ? ? ? ?LONG TERM GOALS: Target date: 11/13/2021 ? ?Pt will achieve a FOTO score meeting her predictive value in order to demonstrate improved functional ability as it relates to her primary impairments. ?Baseline:  Administer at first treatment ?Goal status: INITIAL ? ?2.  Pt will report no headaches x3 weeks in order to complete ADLs with less limitation. ?Baseline: 2-3 debilitating headaches each month ?Goal sta

## 2021-09-30 ENCOUNTER — Ambulatory Visit: Payer: PRIVATE HEALTH INSURANCE

## 2021-09-30 DIAGNOSIS — M6281 Muscle weakness (generalized): Secondary | ICD-10-CM

## 2021-09-30 DIAGNOSIS — M5459 Other low back pain: Secondary | ICD-10-CM

## 2021-09-30 DIAGNOSIS — M542 Cervicalgia: Secondary | ICD-10-CM

## 2021-09-30 NOTE — Therapy (Signed)
?OUTPATIENT PHYSICAL THERAPY TREATMENT NOTE ? ? ?Patient Name: Morgan Wolfe ?MRN: [id number] ?DOB:Jul 07, 1955, 66 y.o., female ?Today's Date: 09/30/2021 ? ?PCP: Caren Macadam, MD ?REFERRING PROVIDER: Susa Day, MD ? ?END OF SESSION:  ? PT End of Session - 09/30/21 4970   ? ? Visit Number 2   ? Number of Visits 17   ? Date for PT Re-Evaluation 11/20/21   ? Authorization Type [employer] MCR   ? Authorization Time Period FOTO v6,v10, KX mod v15   ? Progress Note Due on Visit 10   ? PT Start Time [phone]   ? PT Stop Time 0955   ? PT Time Calculation (min) 43 min   ? Activity Tolerance Patient tolerated treatment well   ? Behavior During Therapy [facility] for tasks assessed/performed   ? ?  ?  ? ?  ? ? ?Past Medical History:  ?Diagnosis Date  ? ALLERGIC RHINITIS   ? Hypertension   ? PONV (postoperative nausea and vomiting)   ? Right shoulder pain   ? ?Past Surgical History:  ?Procedure Laterality Date  ? ANKLE ARTHROSCOPY Left 01/03/2019  ? Procedure: LEFT ANKLE ARTHROSCOPY AND DEBRIDEMENT;  Surgeon: Newt Minion, MD;  Location: [place];  Service: Orthopedics;  Laterality: Left;  ? ANKLE SURGERY Left 2000  ? ELBOW ARTHROSCOPY Right 2019  ? TOE SURGERY Right 1998  ? bone spur  ? VAGINAL HYSTERECTOMY  1980  ? per Dr. Harrington Challenger for fibroids; no cancer  ? ?Patient Active Problem List  ? Diagnosis Date Noted  ? Lymphocytosis 11/20/2020  ? Impingement syndrome of left ankle 01/11/2018  ? Lateral epicondylitis (tennis elbow) 08/31/2016  ? Essential hypertension 12/06/2006  ? Allergic rhinitis 12/06/2006  ? ? ?REFERRING DIAG: M54.2 (ICD-10-CM) - Cervicalgia ? ?THERAPY DIAG:  ?Cervicalgia ? ?Muscle weakness (generalized) ? ?Other low back pain ? ?PERTINENT HISTORY: HTN ? ? ?SUBJECTIVE: Pt reports continued soreness in her Rt upper trap and neck. She rates her LBP as 6/10 and Rt upper trap as 10/10. However, she reports a positive response to OMT at her eval and reports that she would like to have this performed again  today. She reports doing her HEP daily. ? ?PAIN:  ?Are you having pain? Yes: NPRS scale: 10/10 ?Pain location: Rt upper trap, neck ?Pain description: Hervey Ard ?Aggravating factors: cervical movements, walking/ standing >15 minutes, laying on Rt side, laying supine ?Relieving factors: Alieve, heating pad ? ? ?OBJECTIVE: (objective measures completed at initial evaluation unless otherwise dated) ? ? ?OBJECTIVE:  ?  ?DIAGNOSTIC FINDINGS:  ?None related to primary problem ?  ?PATIENT SURVEYS:  ?FOTO (09/30/2021): 52%, projected 62% in 12 visits ?  ?  ?COGNITION: ?Overall cognitive status: Within functional limits for tasks assessed ?  ?  ?SENSATION: ?Not tested ?  ?POSTURE:  ?Forward BIL shoulders/ head ?  ?PALPATION: ?TTP with palpable trigger points in BIL upper and mid traps/ rhomboids and BIL cervical paraspinals ?  ?PASSIVE ACCESSORIES: ?Hypomobile and painful CPAs C3-T8       ?  ?CERVICAL ROM:  ?  ?Active ROM AROM (deg) ?09/18/2021  ?Flexion 60  ?Extension 40p!  ?Right lateral flexion 40  ?Left lateral flexion 40  ?Right rotation 72  ?Left rotation 68  ? (Blank rows = not tested) ?  ?UE ROM: ?  ?Active/Passive ROM Right ?09/18/2021 Left ?09/18/2021  ?Shoulder flexion 135p!/ 170 WNL  ?Shoulder abduction 145p! And audible click/ 858 WNL  ?Shoulder internal rotation 75 minor p!/ 90p! WNL  ?Shoulder  external rotation 85/ 105 WNL  ? (Blank rows = not tested) ?  ?UE MMT: ?  ?MMT Right ?09/18/2021 Left ?09/18/2021  ?Shoulder flexion 5/5 5/5  ?Shoulder extension 5/5 5/5  ?Shoulder abduction 5/5 5/5  ?Shoulder ER 4/5p! 5/5  ?Shoulder IR 5/5 5/5  ?Latissimus dorsi 3+/5 4/5  ?Mid trap 3/5p! 4/5  ?Low trap 3/5 3+/5  ? (Blank rows = not tested) ?  ?CERVICAL SPECIAL TESTS:  ?Cervical quadrant testing: (+) to Rt ?Neer's: (-) on Rt ?Hawkin's-Kennedy: (+) on Rt ?  ?  ?FUNCTIONAL TESTS:  ?DNF endurance testing: 30 seconds ?  ?  ?TODAY'S TREATMENT:  ? ?[facility] Adult PT Treatment:                                                DATE:  09/30/2021 ?Therapeutic Exercise: ?Standing BIL shoulder extension with chin tuck hold and two 3# cables 3x10 ?Standing shoulder rolls 2x10 forward and backward ?Seated low rows with 35# 2x10 ?Seated high rows with 35# 2x10 ?Seated lat pull-downs with 35# 2x10 ?Seated arm circles 2x20 ?Prone swimmers 3x20 ?Manual Therapy: ?Prone CT junction grade V manipulation x1 BIL with cavitation ?Prone thoracic grade V manipulation x5 throughout thoracic spine with cavitation ?Neuromuscular re-ed: ?N/A ?Therapeutic Activity: ?N/A ?Modalities: ?N/A ?Self Care: ?N/A ? ? ?[place] Adult PT Treatment:                                                DATE: 09/18/2021 ?Therapeutic Exercise: ?N/A ?Manual Therapy: ?Prone CT junction grade V manipulation x1 BIL with cavitation ?Prone thoracic grade V manipulation x5 throughout thoracic spine with cavitation ?Neuromuscular re-ed: ?N/A ?Therapeutic Activity: ?N/A ?Modalities: ?N/A ?Self Care: ?N/A ?  ?  ?  ?PATIENT EDUCATION:  ?Education details: Pt educated on potential underlying pathophysiology behind her pain presentation, POC, prognosis, and HEP ?Person educated: Patient ?Education method: Explanation, Demonstration, and Handouts ?Education comprehension: verbalized understanding and returned demonstration ?  ?  ?HOME EXERCISE PROGRAM: ?Access Code: [email or web address] ?URL: [email or web address] ?Date: 09/18/2021 ?Prepared by: Vanessa Brooke ?  ?Exercises ?- Seated Cervical Sidebending Stretch  - 1 x daily - 7 x weekly - 2-min hold ?- Standing Shoulder Row with Anchored Resistance  - 1 x daily - 7 x weekly - 3 sets - 10 reps - 3-sec hold ?- Seated Cervical Retraction  - 1 x daily - 7 x weekly - 3 sets - 10 reps - 5-sec hold ?- Cervical Extension AROM with Strap  - 1 x daily - 7 x weekly - 10 reps - 10-sec hold ? ?Added 09/30/2021: ?- Prone Swimmer  - 1 x daily - 7 x weekly - 3 sets - 20 reps ?  ?ASSESSMENT: ?  ?CLINICAL IMPRESSION: ?Pt responded well to all interventions today,  demonstrating good form and no increase in pain with performed exercises. Additionally, she reports an excellent response to OMT today, with a decrease in pain from 10/10 to 3/10 prior to therapeutic exercises. This improvement in pain was maintained throughout the session. The pt will continue to benefit from skilled PT to address her primary impairments and return to her prior level of function with less limitation. ?  ?  ?OBJECTIVE IMPAIRMENTS decreased ROM, decreased strength, hypomobility, impaired flexibility,  impaired UE functional use, improper body mechanics, postural dysfunction, and pain.  ?  ?ACTIVITY LIMITATIONS cleaning, community activity, driving, laundry, yard work, and shopping.  ?  ?PERSONAL FACTORS Age, Time since onset of injury/illness/exacerbation, and 1 comorbidity: HTN  are also affecting patient's functional outcome.  ?  ?  ?  ?  ?GOALS: ?Goals reviewed with patient? Yes ?  ?SHORT TERM GOALS: Target date: 10/16/2021 ?  ?Pt will report understanding and adherence to her HEP in order to promote independence in the management of her primary impairments. ?Baseline: HEP provided at eval ?Goal status: INITIAL ?  ?  ?  ?LONG TERM GOALS: Target date: 11/13/2021 ?  ?Pt will achieve a FOTO score meeting her predictive value in order to demonstrate improved functional ability as it relates to her primary impairments. ?Baseline: 52%, predicted 62% in 12 visits ?Goal status: INITIAL ?  ?2.  Pt will report no headaches x3 weeks in order to complete ADLs with less limitation. ?Baseline: 2-3 debilitating headaches each month ?Goal status: INITIAL ?  ?3.  Pt will achieve cervical extension AROM of 60 degrees in order to look up into cabinets with less limitation. ?Baseline: 40 degrees ?Goal status: INITIAL ?  ?4.  Pt will achieve BIL global parascapular strength of 4+/5 in order to promote prophylactic measures in mechanical neck pain. ?Baseline: See MMT chart ?Goal status: INITIAL ?  ?  ?  ?  ?PLAN: ?PT  FREQUENCY: 2x/week ?  ?PT DURATION: 8 weeks ?  ?PLANNED INTERVENTIONS: Therapeutic exercises, Therapeutic activity, Neuromuscular re-education, Balance training, Gait training, Patient/Family education, Joint manipulation, Joint mo

## 2021-10-01 NOTE — Therapy (Signed)
OUTPATIENT PHYSICAL THERAPY TREATMENT NOTE   Patient Name: Morgan Wolfe MRN: [id number] DOB:30-Nov-1955, 66 y.o., female Today's Date: 10/02/2021  PCP: Caren Macadam, MD REFERRING PROVIDER: Susa Day, MD  END OF SESSION:   PT End of Session - 10/02/21 1043     Visit Number 3    Number of Visits 17    Date for PT Re-Evaluation 11/20/21    Authorization Type [employer] MCR    Authorization Time Period FOTO v6,v10, KX mod v15    Progress Note Due on Visit 10    PT Start Time 1043    PT Stop Time 1128    PT Time Calculation (min) 45 min    Activity Tolerance Patient tolerated treatment well    Behavior During Therapy WFL for tasks assessed/performed              Past Medical History:  Diagnosis Date   ALLERGIC RHINITIS    Hypertension    PONV (postoperative nausea and vomiting)    Right shoulder pain    Past Surgical History:  Procedure Laterality Date   ANKLE ARTHROSCOPY Left 01/03/2019   Procedure: LEFT ANKLE ARTHROSCOPY AND DEBRIDEMENT;  Surgeon: Newt Minion, MD;  Location: [place];  Service: Orthopedics;  Laterality: Left;   ANKLE SURGERY Left 2000   ELBOW ARTHROSCOPY Right 2019   TOE SURGERY Right 1998   bone spur   VAGINAL HYSTERECTOMY  1980   per Dr. Harrington Challenger for fibroids; no cancer   Patient Active Problem List   Diagnosis Date Noted   Lymphocytosis 11/20/2020   Impingement syndrome of left ankle 01/11/2018   Lateral epicondylitis (tennis elbow) 08/31/2016   Essential hypertension 12/06/2006   Allergic rhinitis 12/06/2006    REFERRING DIAG: M54.2 (ICD-10-CM) - Cervicalgia  THERAPY DIAG:  Cervicalgia  Muscle weakness (generalized)  Other low back pain  PERTINENT HISTORY: HTN   SUBJECTIVE: Patient reports muscle soreness after last session but overall reports less pain today.  She also reports EP compliance. She states she has not had any headaches recently.  PAIN:  Are you having pain? Yes: NPRS scale: 4/10 Pain  location: Rt upper trap, neck Pain description: Sharp Aggravating factors: cervical movements, walking/ standing >15 minutes, laying on Rt side, laying supine Relieving factors: Alieve, heating pad   OBJECTIVE: (objective measures completed at initial evaluation unless otherwise dated)   OBJECTIVE:    DIAGNOSTIC FINDINGS:  None related to primary problem   PATIENT SURVEYS:  FOTO (09/30/2021): 52%, projected 62% in 12 visits     COGNITION: Overall cognitive status: Within functional limits for tasks assessed     SENSATION: Not tested   POSTURE:  Forward BIL shoulders/ head   PALPATION: TTP with palpable trigger points in BIL upper and mid traps/ rhomboids and BIL cervical paraspinals   PASSIVE ACCESSORIES: Hypomobile and painful CPAs C3-T8         CERVICAL ROM:    Active ROM AROM (deg) 09/18/2021  Flexion 60  Extension 40p!  Right lateral flexion 40  Left lateral flexion 40  Right rotation 72  Left rotation 68   (Blank rows = not tested)   UE ROM:   Active/Passive ROM Right 09/18/2021 Left 09/18/2021  Shoulder flexion 135p!/ 170 WNL  Shoulder abduction 145p! And audible click/ 177 WNL  Shoulder internal rotation 75 minor p!/ 90p! WNL  Shoulder external rotation 85/ 105 WNL   (Blank rows = not tested)   UE MMT:   MMT Right 09/18/2021 Left 09/18/2021  Shoulder flexion 5/5 5/5  Shoulder extension 5/5 5/5  Shoulder abduction 5/5 5/5  Shoulder ER 4/5p! 5/5  Shoulder IR 5/5 5/5  Latissimus dorsi 3+/5 4/5  Mid trap 3/5p! 4/5  Low trap 3/5 3+/5   (Blank rows = not tested)   CERVICAL SPECIAL TESTS:  Cervical quadrant testing: (+) to Rt Neer's: (-) on Rt Hawkin's-Kennedy: (+) on Rt     FUNCTIONAL TESTS:  DNF endurance testing: 30 seconds     TODAY'S TREATMENT:  [facility] Adult PT Treatment:                                                DATE: 10/01/2021 Therapeutic Exercise: UBE level 2 x 6 mins (3 forward/retro) Standing BIL shoulder extension with chin tuck  hold and two 3# cables 2x10 Standing shoulder rolls x10 forward and backward Prone supermans 2x10 Prone ITWYs x10 each Prone shoulder extension arms by side x10 Prone alternating UE/LE lift x10 BIL Shoulder ER RTB with towel under arm Rt only 2x10 each Upper trap stretch 2x30" BIL Levator scap stretch 2x30" BIL Manual Therapy: STM to bilateral upper traps (R>L in pain) MTPR R upper trap   [facility] Adult PT Treatment:                                                DATE: 09/30/2021 Therapeutic Exercise: Standing BIL shoulder extension with chin tuck hold and two 3# cables 3x10 Standing shoulder rolls 2x10 forward and backward Seated low rows with 35# 2x10 Seated high rows with 35# 2x10 Seated lat pull-downs with 35# 2x10 Seated arm circles 2x20 Prone swimmers 3x20 Manual Therapy: Prone CT junction grade V manipulation x1 BIL with cavitation Prone thoracic grade V manipulation x5 throughout thoracic spine with cavitation Neuromuscular re-ed: N/A Therapeutic Activity: N/A Modalities: N/A Self Care: N/A   [facility] Adult PT Treatment:                                                DATE: 09/18/2021 Therapeutic Exercise: N/A Manual Therapy: Prone CT junction grade V manipulation x1 BIL with cavitation Prone thoracic grade V manipulation x5 throughout thoracic spine with cavitation Neuromuscular re-ed: N/A Therapeutic Activity: N/A Modalities: N/A Self Care: N/A       PATIENT EDUCATION:  Education details: Pt educated on potential underlying pathophysiology behind her pain presentation, POC, prognosis, and HEP Person educated: Patient Education method: Explanation, Demonstration, and Handouts Education comprehension: verbalized understanding and returned demonstration     HOME EXERCISE PROGRAM: Access Code: [email or web address] URL: [email or web address] Date: 09/18/2021 Prepared by: Vanessa Licking   Exercises - Seated Cervical Sidebending Stretch  - 1 x daily - 7  x weekly - 2-min hold - Standing Shoulder Row with Anchored Resistance  - 1 x daily - 7 x weekly - 3 sets - 10 reps - 3-sec hold - Seated Cervical Retraction  - 1 x daily - 7 x weekly - 3 sets - 10 reps - 5-sec hold - Cervical Extension AROM with Strap  - 1 x daily - 7 x weekly - 10 reps - 10-sec hold  Added 09/30/2021: - Prone Swimmer  - 1 x daily - 7 x weekly - 3 sets - 20 reps   ASSESSMENT:   CLINICAL IMPRESSION: Patient presents to PT stating she had increased muscle soreness after the previous session and that she still has some soreness today but overall reports less pain in her low back and right upper trap. Session today regressed in repetitions and resistance to accommodate increased muscle soreness. Patient was able to tolerate all prescribed exercises with no adverse effects. Patient continues to benefit from skilled PT services and should be progressed as able to improve functional independence.     OBJECTIVE IMPAIRMENTS decreased ROM, decreased strength, hypomobility, impaired flexibility, impaired UE functional use, improper body mechanics, postural dysfunction, and pain.    ACTIVITY LIMITATIONS cleaning, community activity, driving, laundry, yard work, and shopping.    PERSONAL FACTORS Age, Time since onset of injury/illness/exacerbation, and 1 comorbidity: HTN  are also affecting patient's functional outcome.          GOALS: Goals reviewed with patient? Yes   SHORT TERM GOALS: Target date: 10/16/2021   Pt will report understanding and adherence to her HEP in order to promote independence in the management of her primary impairments. Baseline: HEP provided at eval Goal status: INITIAL       LONG TERM GOALS: Target date: 11/13/2021   Pt will achieve a FOTO score meeting her predictive value in order to demonstrate improved functional ability as it relates to her primary impairments. Baseline: 52%, predicted 62% in 12 visits Goal status: INITIAL   2.  Pt will report  no headaches x3 weeks in order to complete ADLs with less limitation. Baseline: 2-3 debilitating headaches each month Goal status: INITIAL   3.  Pt will achieve cervical extension AROM of 60 degrees in order to look up into cabinets with less limitation. Baseline: 40 degrees Goal status: INITIAL   4.  Pt will achieve BIL global parascapular strength of 4+/5 in order to promote prophylactic measures in mechanical neck pain. Baseline: See MMT chart Goal status: INITIAL         PLAN: PT FREQUENCY: 2x/week   PT DURATION: 8 weeks   PLANNED INTERVENTIONS: Therapeutic exercises, Therapeutic activity, Neuromuscular re-education, Balance training, Gait training, Patient/Family education, Joint manipulation, Joint mobilization, Vestibular training, Dry Needling, Electrical stimulation, Spinal manipulation, Spinal mobilization, Cryotherapy, Moist heat, Taping, Traction, Biofeedback, Ionotophoresis '4mg'$ /ml Dexamethasone, and Manual therapy   PLAN FOR NEXT SESSION: Progress DNF/ parascapular strength, manual techniques including TPDN for pain relief, instructions on TPDN    Evelene Croon, PTA 10/02/21 10:48 AM

## 2021-10-02 ENCOUNTER — Ambulatory Visit: Payer: PRIVATE HEALTH INSURANCE

## 2021-10-02 DIAGNOSIS — M542 Cervicalgia: Secondary | ICD-10-CM | POA: Diagnosis not present

## 2021-10-02 DIAGNOSIS — M5459 Other low back pain: Secondary | ICD-10-CM

## 2021-10-02 DIAGNOSIS — M6281 Muscle weakness (generalized): Secondary | ICD-10-CM

## 2021-10-04 NOTE — Therapy (Signed)
OUTPATIENT PHYSICAL THERAPY TREATMENT NOTE   Patient Name: Morgan Wolfe MRN: [id number] DOB:[birth year], 66 y.o., female Today's Date: 10/06/2021  PCP: Caren Macadam, MD REFERRING PROVIDER: Susa Day, MD  END OF SESSION:   PT End of Session - 10/06/21 0759     Visit Number 4    Number of Visits 17    Date for PT Re-Evaluation 11/20/21    Authorization Type [employer] MCR    Authorization Time Period FOTO v6,v10, KX mod v15    Progress Note Due on Visit 10    PT Start Time 0800    PT Stop Time 0840    PT Time Calculation (min) 40 min    Activity Tolerance Patient tolerated treatment well    Behavior During Therapy WFL for tasks assessed/performed               Past Medical History:  Diagnosis Date   ALLERGIC RHINITIS    Hypertension    PONV (postoperative nausea and vomiting)    Right shoulder pain    Past Surgical History:  Procedure Laterality Date   ANKLE ARTHROSCOPY Left 01/03/2019   Procedure: LEFT ANKLE ARTHROSCOPY AND DEBRIDEMENT;  Surgeon: Newt Minion, MD;  Location: [place];  Service: Orthopedics;  Laterality: Left;   ANKLE SURGERY Left 2000   ELBOW ARTHROSCOPY Right 2019   TOE SURGERY Right 1998   bone spur   VAGINAL HYSTERECTOMY  1980   per Dr. Harrington Challenger for fibroids; no cancer   Patient Active Problem List   Diagnosis Date Noted   Lymphocytosis 11/20/2020   Impingement syndrome of left ankle 01/11/2018   Lateral epicondylitis (tennis elbow) 08/31/2016   Essential hypertension 12/06/2006   Allergic rhinitis 12/06/2006    REFERRING DIAG: M54.2 (ICD-10-CM) - Cervicalgia  THERAPY DIAG:  Cervicalgia  Muscle weakness (generalized)  Other low back pain  PERTINENT HISTORY: HTN   SUBJECTIVE: She reports HEP compliance. She reports continued low back pain and upper neck pain.   PAIN:  Are you having pain? Yes: NPRS scale: 4/10 Pain location: Rt upper trap, neck, LBP Pain description: Sharp Aggravating factors:  cervical movements, walking/ standing >15 minutes, laying on Rt side, laying supine Relieving factors: Alieve, heating pad   OBJECTIVE: (objective measures completed at initial evaluation unless otherwise dated)   OBJECTIVE:    DIAGNOSTIC FINDINGS:  None related to primary problem   PATIENT SURVEYS:  FOTO (09/30/2021): 52%, projected 62% in 12 visits     COGNITION: Overall cognitive status: Within functional limits for tasks assessed     SENSATION: Not tested   POSTURE:  Forward BIL shoulders/ head   PALPATION: TTP with palpable trigger points in BIL upper and mid traps/ rhomboids and BIL cervical paraspinals   PASSIVE ACCESSORIES: Hypomobile and painful CPAs C3-T8         CERVICAL ROM:    Active ROM AROM (deg) 09/18/2021  Flexion 60  Extension 40p!  Right lateral flexion 40  Left lateral flexion 40  Right rotation 72  Left rotation 68   (Blank rows = not tested)   UE ROM:   Active/Passive ROM Right 09/18/2021 Left 09/18/2021  Shoulder flexion 135p!/ 170 WNL  Shoulder abduction 145p! And audible click/ 741 WNL  Shoulder internal rotation 75 minor p!/ 90p! WNL  Shoulder external rotation 85/ 105 WNL   (Blank rows = not tested)   UE MMT:   MMT Right 09/18/2021 Left 09/18/2021  Shoulder flexion 5/5 5/5  Shoulder extension 5/5 5/5  Shoulder abduction 5/5 5/5  Shoulder ER 4/5p! 5/5  Shoulder IR 5/5 5/5  Latissimus dorsi 3+/5 4/5  Mid trap 3/5p! 4/5  Low trap 3/5 3+/5   (Blank rows = not tested)   CERVICAL SPECIAL TESTS:  Cervical quadrant testing: (+) to Rt Neer's: (-) on Rt Hawkin's-Kennedy: (+) on Rt     FUNCTIONAL TESTS:  DNF endurance testing: 30 seconds     TODAY'S TREATMENT:  [facility] Adult PT Treatment:                                                DATE: 10/06/2021 Therapeutic Exercise: UBE level 2 x 6 mins (3 forward/retro) Standing BIL shoulder extension with chin tuck hold and two 3# cables 3x10 Standing shoulder rolls x10 forward and  backward Prone supermans 2x10 Prone ITWYs x10 each Prone shoulder extension arms by side x10 Prone alternating UE/LE lift x10 BIL Shoulder ER/IR RTB with towel under arm Rt only 2x10 each Upper trap stretch 2x30" BIL Levator scap stretch 2x30" BIL Seated figure 4 piriformis stretch 2x30" BIL Pball roll up wall with single UE lift off x10 BIL   [facility] Adult PT Treatment:                                                DATE: 10/01/2021 Therapeutic Exercise: UBE level 2 x 6 mins (3 forward/retro) Standing BIL shoulder extension with chin tuck hold and two 3# cables 2x10 Standing shoulder rolls x10 forward and backward Prone supermans 2x10 Prone ITWYs x10 each Prone shoulder extension arms by side x10 Prone alternating UE/LE lift x10 BIL Shoulder ER RTB with towel under arm Rt only 2x10 each Upper trap stretch 2x30" BIL Levator scap stretch 2x30" BIL Manual Therapy: STM to bilateral upper traps (R>L in pain) MTPR R upper trap   [facility] Adult PT Treatment:                                                DATE: 09/30/2021 Therapeutic Exercise: Standing BIL shoulder extension with chin tuck hold and two 3# cables 3x10 Standing shoulder rolls 2x10 forward and backward Seated low rows with 35# 2x10 Seated high rows with 35# 2x10 Seated lat pull-downs with 35# 2x10 Seated arm circles 2x20 Prone swimmers 3x20 Manual Therapy: Prone CT junction grade V manipulation x1 BIL with cavitation Prone thoracic grade V manipulation x5 throughout thoracic spine with cavitation Neuromuscular re-ed: N/A Therapeutic Activity: N/A Modalities: N/A Self Care: N/A       PATIENT EDUCATION:  Education details: Pt educated on potential underlying pathophysiology behind her pain presentation, POC, prognosis, and HEP Person educated: Patient Education method: Explanation, Demonstration, and Handouts Education comprehension: verbalized understanding and returned demonstration     HOME EXERCISE  PROGRAM: Access Code: [email or web address] URL: [email or web address] Date: 09/18/2021 Prepared by: Vanessa Eastman   Exercises - Seated Cervical Sidebending Stretch  - 1 x daily - 7 x weekly - 2-min hold - Standing Shoulder Row with Anchored Resistance  - 1 x daily - 7 x weekly - 3 sets - 10 reps - 3-sec hold -  Seated Cervical Retraction  - 1 x daily - 7 x weekly - 3 sets - 10 reps - 5-sec hold - Cervical Extension AROM with Strap  - 1 x daily - 7 x weekly - 10 reps - 10-sec hold  Added 09/30/2021: - Prone Swimmer  - 1 x daily - 7 x weekly - 3 sets - 20 reps   ASSESSMENT:   CLINICAL IMPRESSION: Patient presents to PT with mild to moderate low back and Rt upper trap pain and reports HEP compliance. Session today focused on parascapular, core and shoulder strengthening. Increased repetitions and resistance of some exercises with no adverse affects. She may be interested in TPDN next session. Patient was able to tolerate all prescribed exercises with no adverse effects. Patient continues to benefit from skilled PT services and should be progressed as able to improve functional independence.     OBJECTIVE IMPAIRMENTS decreased ROM, decreased strength, hypomobility, impaired flexibility, impaired UE functional use, improper body mechanics, postural dysfunction, and pain.    ACTIVITY LIMITATIONS cleaning, community activity, driving, laundry, yard work, and shopping.    PERSONAL FACTORS Age, Time since onset of injury/illness/exacerbation, and 1 comorbidity: HTN  are also affecting patient's functional outcome.          GOALS: Goals reviewed with patient? Yes   SHORT TERM GOALS: Target date: 10/16/2021   Pt will report understanding and adherence to her HEP in order to promote independence in the management of her primary impairments. Baseline: HEP provided at eval Goal status: INITIAL       LONG TERM GOALS: Target date: 11/13/2021   Pt will achieve a FOTO score meeting her  predictive value in order to demonstrate improved functional ability as it relates to her primary impairments. Baseline: 52%, predicted 62% in 12 visits Goal status: INITIAL   2.  Pt will report no headaches x3 weeks in order to complete ADLs with less limitation. Baseline: 2-3 debilitating headaches each month Goal status: INITIAL   3.  Pt will achieve cervical extension AROM of 60 degrees in order to look up into cabinets with less limitation. Baseline: 40 degrees Goal status: INITIAL   4.  Pt will achieve BIL global parascapular strength of 4+/5 in order to promote prophylactic measures in mechanical neck pain. Baseline: See MMT chart Goal status: INITIAL         PLAN: PT FREQUENCY: 2x/week   PT DURATION: 8 weeks   PLANNED INTERVENTIONS: Therapeutic exercises, Therapeutic activity, Neuromuscular re-education, Balance training, Gait training, Patient/Family education, Joint manipulation, Joint mobilization, Vestibular training, Dry Needling, Electrical stimulation, Spinal manipulation, Spinal mobilization, Cryotherapy, Moist heat, Taping, Traction, Biofeedback, Ionotophoresis '4mg'$ /ml Dexamethasone, and Manual therapy   PLAN FOR NEXT SESSION: Progress DNF/ parascapular strength, manual techniques including TPDN for pain relief, instructions on TPDN    Evelene Croon, PTA 10/06/21 8:40 AM

## 2021-10-06 ENCOUNTER — Ambulatory Visit: Payer: PRIVATE HEALTH INSURANCE

## 2021-10-06 DIAGNOSIS — M6281 Muscle weakness (generalized): Secondary | ICD-10-CM

## 2021-10-06 DIAGNOSIS — M542 Cervicalgia: Secondary | ICD-10-CM | POA: Diagnosis not present

## 2021-10-06 DIAGNOSIS — M5459 Other low back pain: Secondary | ICD-10-CM

## 2021-10-08 NOTE — Therapy (Signed)
OUTPATIENT PHYSICAL THERAPY TREATMENT NOTE   Patient Name: Morgan Wolfe MRN: [id number] DOB:May 13, 1956, 66 y.o., female Today's Date: 10/09/2021  PCP: Caren Macadam, MD REFERRING PROVIDER: Susa Day, MD  END OF SESSION:   PT End of Session - 10/09/21 0908     Visit Number 5    Number of Visits 17    Date for PT Re-Evaluation 11/20/21    Authorization Type [employer] MCR    Authorization Time Period FOTO v6,v10, KX mod v15    Progress Note Due on Visit 10    PT Start Time 0915    PT Stop Time 0955    PT Time Calculation (min) 40 min    Activity Tolerance Patient tolerated treatment well    Behavior During Therapy WFL for tasks assessed/performed                Past Medical History:  Diagnosis Date   ALLERGIC RHINITIS    Hypertension    PONV (postoperative nausea and vomiting)    Right shoulder pain    Past Surgical History:  Procedure Laterality Date   ANKLE ARTHROSCOPY Left 01/03/2019   Procedure: LEFT ANKLE ARTHROSCOPY AND DEBRIDEMENT;  Surgeon: Newt Minion, MD;  Location: [place];  Service: Orthopedics;  Laterality: Left;   ANKLE SURGERY Left 2000   ELBOW ARTHROSCOPY Right 2019   TOE SURGERY Right 1998   bone spur   VAGINAL HYSTERECTOMY  1980   per Dr. Harrington Challenger for fibroids; no cancer   Patient Active Problem List   Diagnosis Date Noted   Lymphocytosis 11/20/2020   Impingement syndrome of left ankle 01/11/2018   Lateral epicondylitis (tennis elbow) 08/31/2016   Essential hypertension 12/06/2006   Allergic rhinitis 12/06/2006    REFERRING DIAG: M54.2 (ICD-10-CM) - Cervicalgia  THERAPY DIAG:  Cervicalgia  Muscle weakness (generalized)  Other low back pain  PERTINENT HISTORY:  HTN  SUBJECTIVE:  Pt presents to PT with reports of continued neck and lower back pain. She has been compliant with HEP with no adverse effect. Pt is ready to begin PT at this time.   PAIN:  Are you having pain?  Yes: NPRS scale: 4/10 Pain  location: Rt upper trap, neck, LBP Pain description: Sharp Aggravating factors: cervical movements, walking/ standing >15 minutes, laying on Rt side, laying supine Relieving factors: Alieve, heating pad   OBJECTIVE: (objective measures completed at initial evaluation unless otherwise dated)   OBJECTIVE:    PATIENT SURVEYS:  FOTO (09/30/2021): 52%, projected 62% in 12 visits    PASSIVE ACCESSORIES: Hypomobile and painful CPAs C3-T8         CERVICAL ROM:    Active ROM AROM (deg) 09/18/2021  Flexion 60  Extension 40p!  Right lateral flexion 40  Left lateral flexion 40  Right rotation 72  Left rotation 68   (Blank rows = not tested)   UE ROM:   Active/Passive ROM Right 09/18/2021 Left 09/18/2021  Shoulder flexion 135p!/ 170 WNL  Shoulder abduction 145p! And audible click/ 831 WNL  Shoulder internal rotation 75 minor p!/ 90p! WNL  Shoulder external rotation 85/ 105 WNL   (Blank rows = not tested)   UE MMT:   MMT Right 09/18/2021 Left 09/18/2021  Shoulder flexion 5/5 5/5  Shoulder extension 5/5 5/5  Shoulder abduction 5/5 5/5  Shoulder ER 4/5p! 5/5  Shoulder IR 5/5 5/5  Latissimus dorsi 3+/5 4/5  Mid trap 3/5p! 4/5  Low trap 3/5 3+/5   (Blank rows = not tested)  CERVICAL SPECIAL TESTS:  Cervical quadrant testing: (+) to Rt Neer's: (-) on Rt Hawkin's-Kennedy: (+) on Rt     FUNCTIONAL TESTS:  DNF endurance testing: 30 seconds     TODAY'S TREATMENT:  [facility] Adult PT Treatment:                                                DATE: 10/09/2021 Therapeutic Exercise: UBE level 2 x 4 min (50fd/2bwd) while taking subjective Row 2x10 17# Standing BIL shoulder extension with chin tuck 17# 2x10 Supine horizontal abd x 15 GTB Bridge 2x10 - 3" hold Standing shoulder rolls x10 forward and backward Prone supermans 2x10 Prone ITYs x10 each Shoulder ER/IR RTB with towel under arm Rt only 2x10 each Upper trap stretch 2x30" BIL Levator scap stretch 2x30" BIL Seated figure 4  piriformis stretch 2x30" BIL Pball roll up wall with single UE lift off x10 BIL  [facility] Adult PT Treatment:                                                DATE: 10/06/2021 Therapeutic Exercise: UBE level 2 x 6 mins (3 forward/retro) Standing BIL shoulder extension with chin tuck hold and two 3# cables 3x10 Standing shoulder rolls x10 forward and backward Prone supermans 2x10 Prone ITWYs x10 each Prone shoulder extension arms by side x10 Prone alternating UE/LE lift x10 BIL Shoulder ER/IR RTB with towel under arm Rt only 2x10 each Upper trap stretch 2x30" BIL Levator scap stretch 2x30" BIL Seated figure 4 piriformis stretch 2x30" BIL Pball roll up wall with single UE lift off x10 BIL  [facility] Adult PT Treatment:                                                DATE: 10/01/2021 Therapeutic Exercise: UBE level 2 x 6 mins (3 forward/retro) Standing BIL shoulder extension with chin tuck hold and two 3# cables 2x10 Standing shoulder rolls x10 forward and backward Prone supermans 2x10 Prone ITWYs x10 each Prone shoulder extension arms by side x10 Prone alternating UE/LE lift x10 BIL Shoulder ER RTB with towel under arm Rt only 2x10 each Upper trap stretch 2x30" BIL Levator scap stretch 2x30" BIL Manual Therapy: STM to bilateral upper traps (R>L in pain) MTPR R upper trap  PATIENT EDUCATION:  Education details: Pt educated on potential underlying pathophysiology behind her pain presentation, POC, prognosis, and HEP Person educated: Patient Education method: Explanation, Demonstration, and Handouts Education comprehension: verbalized understanding and returned demonstration     HOME EXERCISE PROGRAM: Access Code: [email or web address]: [email or web address] Date: 09/18/2021 Prepared by: TVanessa Hostetter  Exercises - Seated Cervical Sidebending Stretch  - 1 x daily - 7 x weekly - 2-min hold - Standing Shoulder Row with Anchored Resistance  - 1 x daily - 7 x weekly - 3 sets - 10  reps - 3-sec hold - Seated Cervical Retraction  - 1 x daily - 7 x weekly - 3 sets - 10 reps - 5-sec hold - Cervical Extension AROM with Strap  - 1 x daily - 7 x weekly - 10  reps - 10-sec hold  Added 09/30/2021: - Prone Swimmer  - 1 x daily - 7 x weekly - 3 sets - 20 reps   ASSESSMENT:   CLINICAL IMPRESSION: Pt was able to complete all prescribed exercises with no adverse effect or increase in pain. She responded well to manual therapy interventions, noting decreased pain and discomfort. Therapy today focused on improving periscapular and core strength in order to decrease pain and improve function. She continues to benefit from skilled PT services and will continue to be seen and progressed as able per POC.      OBJECTIVE IMPAIRMENTS decreased ROM, decreased strength, hypomobility, impaired flexibility, impaired UE functional use, improper body mechanics, postural dysfunction, and pain.    ACTIVITY LIMITATIONS cleaning, community activity, driving, laundry, yard work, and shopping.    PERSONAL FACTORS Age, Time since onset of injury/illness/exacerbation, and 1 comorbidity: HTN  are also affecting patient's functional outcome.          GOALS: Goals reviewed with patient? Yes   SHORT TERM GOALS: Target date: 10/16/2021   Pt will report understanding and adherence to her HEP in order to promote independence in the management of her primary impairments. Baseline: HEP provided at eval Goal status: INITIAL       LONG TERM GOALS: Target date: 11/13/2021   Pt will achieve a FOTO score meeting her predictive value in order to demonstrate improved functional ability as it relates to her primary impairments. Baseline: 52%, predicted 62% in 12 visits Goal status: INITIAL   2.  Pt will report no headaches x3 weeks in order to complete ADLs with less limitation. Baseline: 2-3 debilitating headaches each month Goal status: INITIAL   3.  Pt will achieve cervical extension AROM of 60 degrees in  order to look up into cabinets with less limitation. Baseline: 40 degrees Goal status: INITIAL   4.  Pt will achieve BIL global parascapular strength of 4+/5 in order to promote prophylactic measures in mechanical neck pain. Baseline: See MMT chart Goal status: INITIAL         PLAN: PT FREQUENCY: 2x/week   PT DURATION: 8 weeks   PLANNED INTERVENTIONS: Therapeutic exercises, Therapeutic activity, Neuromuscular re-education, Balance training, Gait training, Patient/Family education, Joint manipulation, Joint mobilization, Vestibular training, Dry Needling, Electrical stimulation, Spinal manipulation, Spinal mobilization, Cryotherapy, Moist heat, Taping, Traction, Biofeedback, Ionotophoresis '4mg'$ /ml Dexamethasone, and Manual therapy   PLAN FOR NEXT SESSION: Progress DNF/ parascapular strength, manual techniques including TPDN for pain relief, instructions on TPDN    Ward Chatters PT 10/09/21 10:01 AM

## 2021-10-09 ENCOUNTER — Ambulatory Visit: Payer: PRIVATE HEALTH INSURANCE

## 2021-10-09 DIAGNOSIS — M6281 Muscle weakness (generalized): Secondary | ICD-10-CM

## 2021-10-09 DIAGNOSIS — M542 Cervicalgia: Secondary | ICD-10-CM

## 2021-10-09 DIAGNOSIS — M5459 Other low back pain: Secondary | ICD-10-CM

## 2021-10-16 ENCOUNTER — Ambulatory Visit: Payer: Medicare HMO | Attending: Specialist

## 2021-10-16 DIAGNOSIS — M5459 Other low back pain: Secondary | ICD-10-CM | POA: Insufficient documentation

## 2021-10-16 DIAGNOSIS — M542 Cervicalgia: Secondary | ICD-10-CM | POA: Diagnosis present

## 2021-10-16 DIAGNOSIS — M6281 Muscle weakness (generalized): Secondary | ICD-10-CM | POA: Diagnosis present

## 2021-10-16 NOTE — Therapy (Signed)
OUTPATIENT PHYSICAL THERAPY TREATMENT NOTE   Patient Name: Morgan Wolfe MRN: [id number] DOB:1955-12-19, 66 y.o., female Today's Date: 10/16/2021  PCP: Caren Macadam, MD REFERRING PROVIDER: Susa Day, MD  END OF SESSION:   PT End of Session - 10/16/21 0915     Visit Number 6    Number of Visits 17    Date for PT Re-Evaluation 11/20/21    Authorization Type [employer] MCR    Authorization Time Period FOTO v6,v10, KX mod v15    Progress Note Due on Visit 10    PT Start Time 0916    PT Stop Time 0958    PT Time Calculation (min) 42 min    Activity Tolerance Patient tolerated treatment well    Behavior During Therapy WFL for tasks assessed/performed                 Past Medical History:  Diagnosis Date   ALLERGIC RHINITIS    Hypertension    PONV (postoperative nausea and vomiting)    Right shoulder pain    Past Surgical History:  Procedure Laterality Date   ANKLE ARTHROSCOPY Left 01/03/2019   Procedure: LEFT ANKLE ARTHROSCOPY AND DEBRIDEMENT;  Surgeon: Newt Minion, MD;  Location: [place];  Service: Orthopedics;  Laterality: Left;   ANKLE SURGERY Left 2000   ELBOW ARTHROSCOPY Right 2019   TOE SURGERY Right 1998   bone spur   VAGINAL HYSTERECTOMY  1980   per Dr. Harrington Challenger for fibroids; no cancer   Patient Active Problem List   Diagnosis Date Noted   Lymphocytosis 11/20/2020   Impingement syndrome of left ankle 01/11/2018   Lateral epicondylitis (tennis elbow) 08/31/2016   Essential hypertension 12/06/2006   Allergic rhinitis 12/06/2006    REFERRING DIAG: M54.2 (ICD-10-CM) - Cervicalgia  THERAPY DIAG:  Cervicalgia  Muscle weakness (generalized)  Other low back pain  PERTINENT HISTORY:  HTN  SUBJECTIVE:  Pt reports primary c/o LBP today, adding that her neck pain has been much better. She reports doing her HEP daily.   PAIN:  Are you having pain?  Yes: NPRS scale: 4/10 LBP, 2.5/10 Rt UT pain Pain description:  Sharp Aggravating factors: cervical movements, walking/ standing >15 minutes, laying on Rt side, laying supine Relieving factors: Alieve, heating pad   OBJECTIVE: (objective measures completed at initial evaluation unless otherwise dated)   OBJECTIVE:    PATIENT SURVEYS:  FOTO (09/30/2021): 52%, projected 62% in 12 visits 10/16/2021: 53%    PASSIVE ACCESSORIES: Hypomobile and painful CPAs C3-T8         CERVICAL ROM:    Active ROM AROM (deg) 09/18/2021 AROM 10/16/2021  Flexion 60 80  Extension 40p! 45p!  Right lateral flexion 40   Left lateral flexion 40   Right rotation 72   Left rotation 68    (Blank rows = not tested)   UE ROM:   Active/Passive ROM Right 09/18/2021 Left 09/18/2021 Right 10/16/2021  Shoulder flexion 135p!/ 170 WNL 160/  180  Shoulder abduction 145p! And audible click/ 660 WNL 630/160 minor pain at end range  Shoulder internal rotation 75 minor p!/ 90p! WNL 72/85  Shoulder external rotation 85/ 105 WNL 82/90   (Blank rows = not tested)   UE MMT:   MMT Right 09/18/2021 Left 09/18/2021 Right 10/16/2021 Left 10/16/2021  Shoulder flexion 5/5 5/5    Shoulder extension 5/5 5/5    Shoulder abduction 5/5 5/5    Shoulder ER 4/5p! 5/5 4+/5   Shoulder IR 5/5  5/5    Latissimus dorsi 3+/5 4/5 5/5 5/5  Mid trap 3/5p! 4/5 5/5 4+/5  Low trap 3/5 3+/5 4+/5 4+/5   (Blank rows = not tested)   CERVICAL SPECIAL TESTS:  Cervical quadrant testing: (+) to Rt Neer's: (-) on Rt Hawkin's-Kennedy: (+) on Rt     FUNCTIONAL TESTS:  DNF endurance testing: 30 seconds     TODAY'S TREATMENT:   [facility] Adult PT Treatment:                                                DATE: 10/16/2021 Therapeutic Exercise: Quadruped chin tucks 3x15 Knee planks x3 to failure Side knee planks x2 to failure BIL Sidelying lumbar open books x10 BIL Manual Therapy: Prone CT junction grade V manipulation x1 BIL with cavitation Prone thoracic grade V manipulation x4 throughout thoracic spine with  cavitation Sidelying lumbar roll grade V manipulation x1 BIL with cavitation Supine suboccipital release/ STM to BIL suboccipital musculature x6 minutes Neuromuscular re-ed: N/A Therapeutic Activity: Re-assessment of objective measures with pt education Re-administration of FOTO with pt education Modalities: N/A Self Care: N/A   [facility] Adult PT Treatment:                                                DATE: 10/09/2021 Therapeutic Exercise: UBE level 2 x 4 min (38fd/2bwd) while taking subjective Row 2x10 17# Standing BIL shoulder extension with chin tuck 17# 2x10 Supine horizontal abd x 15 GTB Bridge 2x10 - 3" hold Standing shoulder rolls x10 forward and backward Prone supermans 2x10 Prone ITYs x10 each Shoulder ER/IR RTB with towel under arm Rt only 2x10 each Upper trap stretch 2x30" BIL Levator scap stretch 2x30" BIL Seated figure 4 piriformis stretch 2x30" BIL Pball roll up wall with single UE lift off x10 BIL  [facility] Adult PT Treatment:                                                DATE: 10/06/2021 Therapeutic Exercise: UBE level 2 x 6 mins (3 forward/retro) Standing BIL shoulder extension with chin tuck hold and two 3# cables 3x10 Standing shoulder rolls x10 forward and backward Prone supermans 2x10 Prone ITWYs x10 each Prone shoulder extension arms by side x10 Prone alternating UE/LE lift x10 BIL Shoulder ER/IR RTB with towel under arm Rt only 2x10 each Upper trap stretch 2x30" BIL Levator scap stretch 2x30" BIL Seated figure 4 piriformis stretch 2x30" BIL Pball roll up wall with single UE lift off x10 BIL   PATIENT EDUCATION:  Education details: Pt educated on potential underlying pathophysiology behind her pain presentation, POC, prognosis, and HEP Person educated: Patient Education method: Explanation, Demonstration, and Handouts Education comprehension: verbalized understanding and returned demonstration     HOME EXERCISE PROGRAM: Access Code: [email or web address]:  [email or web address] Date: 09/18/2021 Prepared by: TVanessa Bellmead  Exercises - Seated Cervical Sidebending Stretch  - 1 x daily - 7 x weekly - 2-min hold - Standing Shoulder Row with Anchored Resistance  - 1 x daily - 7 x weekly - 3 sets - 10 reps -  3-sec hold - Seated Cervical Retraction  - 1 x daily - 7 x weekly - 3 sets - 10 reps - 5-sec hold - Cervical Extension AROM with Strap  - 1 x daily - 7 x weekly - 10 reps - 10-sec hold  Added 09/30/2021: - Prone Swimmer  - 1 x daily - 7 x weekly - 3 sets - 20 reps  Added 10/16/2021:  - Plank on Knees  - 1 x daily - 7 x weekly - 3 sets - to failure hold - Side Plank on Knees  - 1 x daily - 7 x weekly - 2 sets - 10 reps - to failure hold   ASSESSMENT:   CLINICAL IMPRESSION: Pt responded well to all interventions today, demonstrating good form and no increase in pain with selected exercises. Interventions focused on core and DNF strengthening today. Upon re-assessment of objective measures, the pt has made excellent improvement in parascapular strength and global Rt shoulder AROM. She will continue to benefit from skilled PT to address her primary impairments and return to her prior level of function with less limitation.      OBJECTIVE IMPAIRMENTS decreased ROM, decreased strength, hypomobility, impaired flexibility, impaired UE functional use, improper body mechanics, postural dysfunction, and pain.    ACTIVITY LIMITATIONS cleaning, community activity, driving, laundry, yard work, and shopping.    PERSONAL FACTORS Age, Time since onset of injury/illness/exacerbation, and 1 comorbidity: HTN  are also affecting patient's functional outcome.          GOALS: Goals reviewed with patient? Yes   SHORT TERM GOALS: Target date: 10/16/2021   Pt will report understanding and adherence to her HEP in order to promote independence in the management of her primary impairments. Baseline: HEP provided at eval 10/16/2021: Pt reports daily  HEP adherence Goal status: ACHIEVED       LONG TERM GOALS: Target date: 11/13/2021   Pt will achieve a FOTO score meeting her predictive value in order to demonstrate improved functional ability as it relates to her primary impairments. Baseline: 52%, predicted 62% in 12 visits 10/16/2021: 53% Goal status: PROGRESSING   2.  Pt will report no headaches x3 weeks in order to complete ADLs with less limitation. Baseline: 2-3 debilitating headaches each month 10/16/2021: No headaches since beginning PT Goal status: ACHIEVED   3.  Pt will achieve cervical extension AROM of 60 degrees in order to look up into cabinets with less limitation. Baseline: 40 degrees 10/16/2021: 45 degrees Goal status: PROGRESSING 4.  Pt will achieve BIL global parascapular strength of 4+/5 in order to promote prophylactic measures in mechanical neck pain. Baseline: See MMT chart 10/16/2021: See updated MMT chart Goal status: ACHIEVED         PLAN: PT FREQUENCY: 2x/week   PT DURATION: 8 weeks   PLANNED INTERVENTIONS: Therapeutic exercises, Therapeutic activity, Neuromuscular re-education, Balance training, Gait training, Patient/Family education, Joint manipulation, Joint mobilization, Vestibular training, Dry Needling, Electrical stimulation, Spinal manipulation, Spinal mobilization, Cryotherapy, Moist heat, Taping, Traction, Biofeedback, Ionotophoresis '4mg'$ /ml Dexamethasone, and Manual therapy   PLAN FOR NEXT SESSION: Progress DNF/ parascapular strength, manual techniques including TPDN for pain relief, instructions on TPDN    Carolann Littler Morgan Wolfe PT 10/16/21 10:00 AM

## 2021-10-17 ENCOUNTER — Ambulatory Visit: Payer: Medicare HMO

## 2021-10-21 ENCOUNTER — Ambulatory Visit: Payer: Medicare HMO

## 2021-10-21 DIAGNOSIS — M5459 Other low back pain: Secondary | ICD-10-CM

## 2021-10-21 DIAGNOSIS — M542 Cervicalgia: Secondary | ICD-10-CM | POA: Diagnosis not present

## 2021-10-21 DIAGNOSIS — M6281 Muscle weakness (generalized): Secondary | ICD-10-CM

## 2021-10-21 NOTE — Therapy (Signed)
OUTPATIENT PHYSICAL THERAPY TREATMENT NOTE   Patient Name: Morgan Wolfe MRN: [id number] DOB:18-Apr-1956, 66 y.o., female Today's Date: 10/21/2021  PCP: Caren Macadam, MD REFERRING PROVIDER: Susa Day, MD  END OF SESSION:   PT End of Session - 10/21/21 0919     Visit Number 7    Number of Visits 17    Date for PT Re-Evaluation 11/20/21    Authorization Type [employer] MCR    Authorization Time Period FOTO v6,v10, KX mod v15    Progress Note Due on Visit 10    PT Start Time 0919    PT Stop Time 0958    PT Time Calculation (min) 39 min    Activity Tolerance Patient tolerated treatment well    Behavior During Therapy WFL for tasks assessed/performed                  Past Medical History:  Diagnosis Date   ALLERGIC RHINITIS    Hypertension    PONV (postoperative nausea and vomiting)    Right shoulder pain    Past Surgical History:  Procedure Laterality Date   ANKLE ARTHROSCOPY Left 01/03/2019   Procedure: LEFT ANKLE ARTHROSCOPY AND DEBRIDEMENT;  Surgeon: Newt Minion, MD;  Location: [place];  Service: Orthopedics;  Laterality: Left;   ANKLE SURGERY Left 2000   ELBOW ARTHROSCOPY Right 2019   TOE SURGERY Right 1998   bone spur   VAGINAL HYSTERECTOMY  1980   per Dr. Harrington Challenger for fibroids; no cancer   Patient Active Problem List   Diagnosis Date Noted   Lymphocytosis 11/20/2020   Impingement syndrome of left ankle 01/11/2018   Lateral epicondylitis (tennis elbow) 08/31/2016   Essential hypertension 12/06/2006   Allergic rhinitis 12/06/2006    REFERRING DIAG: M54.2 (ICD-10-CM) - Cervicalgia  THERAPY DIAG:  Cervicalgia  Muscle weakness (generalized)  Other low back pain  PERTINENT HISTORY:  HTN  SUBJECTIVE:  Pt reports she was sore following her last visit lasting a few days. She reports she feels like she is getting better, although she continues to have low-level LBP.   PAIN:  Are you having pain?  Yes: NPRS scale: 2/10 LBP,  2/10 Rt UT pain Pain description: Sharp Aggravating factors: cervical movements, walking/ standing >15 minutes, laying on Rt side, laying supine Relieving factors: Alieve, heating pad   OBJECTIVE: (objective measures completed at initial evaluation unless otherwise dated)   OBJECTIVE:    PATIENT SURVEYS:  FOTO (09/30/2021): 52%, projected 62% in 12 visits 10/16/2021: 53%    PASSIVE ACCESSORIES: Hypomobile and painful CPAs C3-T8         CERVICAL ROM:    Active ROM AROM (deg) 09/18/2021 AROM 10/16/2021  Flexion 60 80  Extension 40p! 45p!  Right lateral flexion 40   Left lateral flexion 40   Right rotation 72   Left rotation 68    (Blank rows = not tested)   UE ROM:   Active/Passive ROM Right 09/18/2021 Left 09/18/2021 Right 10/16/2021  Shoulder flexion 135p!/ 170 WNL 160/  180  Shoulder abduction 145p! And audible click/ 478 WNL 295/621 minor pain at end range  Shoulder internal rotation 75 minor p!/ 90p! WNL 72/85  Shoulder external rotation 85/ 105 WNL 82/90   (Blank rows = not tested)   UE MMT:   MMT Right 09/18/2021 Left 09/18/2021 Right 10/16/2021 Left 10/16/2021  Shoulder flexion 5/5 5/5    Shoulder extension 5/5 5/5    Shoulder abduction 5/5 5/5    Shoulder  ER 4/5p! 5/5 4+/5   Shoulder IR 5/5 5/5    Latissimus dorsi 3+/5 4/5 5/5 5/5  Mid trap 3/5p! 4/5 5/5 4+/5  Low trap 3/5 3+/5 4+/5 4+/5   (Blank rows = not tested)   CERVICAL SPECIAL TESTS:  Cervical quadrant testing: (+) to Rt Neer's: (-) on Rt Hawkin's-Kennedy: (+) on Rt     FUNCTIONAL TESTS:  DNF endurance testing: 30 seconds     TODAY'S TREATMENT:   [facility] Adult PT Treatment:                                                DATE: 10/21/2021 Therapeutic Exercise: Alternating lunge with overhead pull x8 with 7# cables, x8 with 3# cables Straight arm reverse crunches with 2kg ball hand-to-feet hand-off 3x6 LTR 2x10 Standing Cybex hip abduction with 25# 2x10 BIL Standing Cybex hip extension with 25# cable  2x10 BIL Seated piriformis stretch x1 min BIL Manual Therapy: N/A Neuromuscular re-ed: N/A Therapeutic Activity: N/A Modalities: N/A Self Care: N/A   [facility] Adult PT Treatment:                                                DATE: 10/16/2021 Therapeutic Exercise: Quadruped chin tucks 3x15 Knee planks x3 to failure Side knee planks x2 to failure BIL Sidelying lumbar open books x10 BIL Manual Therapy: Prone CT junction grade V manipulation x1 BIL with cavitation Prone thoracic grade V manipulation x4 throughout thoracic spine with cavitation Sidelying lumbar roll grade V manipulation x1 BIL with cavitation Supine suboccipital release/ STM to BIL suboccipital musculature x6 minutes Neuromuscular re-ed: N/A Therapeutic Activity: Re-assessment of objective measures with pt education Re-administration of FOTO with pt education Modalities: N/A Self Care: N/A   [facility] Adult PT Treatment:                                                DATE: 10/09/2021 Therapeutic Exercise: UBE level 2 x 4 min (93fd/2bwd) while taking subjective Row 2x10 17# Standing BIL shoulder extension with chin tuck 17# 2x10 Supine horizontal abd x 15 GTB Bridge 2x10 - 3" hold Standing shoulder rolls x10 forward and backward Prone supermans 2x10 Prone ITYs x10 each Shoulder ER/IR RTB with towel under arm Rt only 2x10 each Upper trap stretch 2x30" BIL Levator scap stretch 2x30" BIL Seated figure 4 piriformis stretch 2x30" BIL Pball roll up wall with single UE lift off x10 BIL     PATIENT EDUCATION:  Education details: Pt educated on potential underlying pathophysiology behind her pain presentation, POC, prognosis, and HEP Person educated: Patient Education method: Explanation, Demonstration, and Handouts Education comprehension: verbalized understanding and returned demonstration     HOME EXERCISE PROGRAM: Access Code: [email or web address]: [email or web address] Date: 09/18/2021 Prepared by:  TVanessa Yoe  Exercises - Seated Cervical Sidebending Stretch  - 1 x daily - 7 x weekly - 2-min hold - Standing Shoulder Row with Anchored Resistance  - 1 x daily - 7 x weekly - 3 sets - 10 reps - 3-sec hold - Seated Cervical Retraction  - 1 x daily - 7  x weekly - 3 sets - 10 reps - 5-sec hold - Cervical Extension AROM with Strap  - 1 x daily - 7 x weekly - 10 reps - 10-sec hold  Added 09/30/2021: - Prone Swimmer  - 1 x daily - 7 x weekly - 3 sets - 20 reps  Added 10/16/2021:  - Plank on Knees  - 1 x daily - 7 x weekly - 3 sets - to failure hold - Side Plank on Knees  - 1 x daily - 7 x weekly - 2 sets - 10 reps - to failure hold   ASSESSMENT:   CLINICAL IMPRESSION: Pt responded excellently to all interventions today, demonstrating good form and no increase in pain with selected exercises. She will continue to benefit from skilled PT to address her primary impairments and return to her prior level of function with less limitation.      OBJECTIVE IMPAIRMENTS decreased ROM, decreased strength, hypomobility, impaired flexibility, impaired UE functional use, improper body mechanics, postural dysfunction, and pain.    ACTIVITY LIMITATIONS cleaning, community activity, driving, laundry, yard work, and shopping.    PERSONAL FACTORS Age, Time since onset of injury/illness/exacerbation, and 1 comorbidity: HTN  are also affecting patient's functional outcome.          GOALS: Goals reviewed with patient? Yes   SHORT TERM GOALS: Target date: 10/16/2021   Pt will report understanding and adherence to her HEP in order to promote independence in the management of her primary impairments. Baseline: HEP provided at eval 10/16/2021: Pt reports daily HEP adherence Goal status: ACHIEVED       LONG TERM GOALS: Target date: 11/13/2021   Pt will achieve a FOTO score meeting her predictive value in order to demonstrate improved functional ability as it relates to her primary impairments. Baseline:  52%, predicted 62% in 12 visits 10/16/2021: 53% Goal status: PROGRESSING   2.  Pt will report no headaches x3 weeks in order to complete ADLs with less limitation. Baseline: 2-3 debilitating headaches each month 10/16/2021: No headaches since beginning PT Goal status: ACHIEVED   3.  Pt will achieve cervical extension AROM of 60 degrees in order to look up into cabinets with less limitation. Baseline: 40 degrees 10/16/2021: 45 degrees Goal status: PROGRESSING 4.  Pt will achieve BIL global parascapular strength of 4+/5 in order to promote prophylactic measures in mechanical neck pain. Baseline: See MMT chart 10/16/2021: See updated MMT chart Goal status: ACHIEVED         PLAN: PT FREQUENCY: 2x/week   PT DURATION: 8 weeks   PLANNED INTERVENTIONS: Therapeutic exercises, Therapeutic activity, Neuromuscular re-education, Balance training, Gait training, Patient/Family education, Joint manipulation, Joint mobilization, Vestibular training, Dry Needling, Electrical stimulation, Spinal manipulation, Spinal mobilization, Cryotherapy, Moist heat, Taping, Traction, Biofeedback, Ionotophoresis '4mg'$ /ml Dexamethasone, and Manual therapy   PLAN FOR NEXT SESSION: Progress DNF/ parascapular strength, manual techniques including TPDN for pain relief, instructions on TPDN    Carolann Littler Wilbert Hayashi PT 10/21/21 9:58 AM

## 2021-10-23 ENCOUNTER — Ambulatory Visit: Payer: Medicare HMO

## 2021-10-23 DIAGNOSIS — M6281 Muscle weakness (generalized): Secondary | ICD-10-CM

## 2021-10-23 DIAGNOSIS — M542 Cervicalgia: Secondary | ICD-10-CM | POA: Diagnosis not present

## 2021-10-23 DIAGNOSIS — M5459 Other low back pain: Secondary | ICD-10-CM

## 2021-10-23 NOTE — Therapy (Signed)
OUTPATIENT PHYSICAL THERAPY TREATMENT NOTE   Patient Name: ELLIETTE SEABOLT MRN: [id number] DOB:04-08-1956, 66 y.o., female Today's Date: 10/23/2021  PCP: Caren Macadam, MD REFERRING PROVIDER: Susa Day, MD  END OF SESSION:   PT End of Session - 10/23/21 0954     Visit Number 8    Number of Visits 17    Date for PT Re-Evaluation 11/20/21    Authorization Type [employer] MCR    Authorization Time Period FOTO v6,v10, KX mod v15    Progress Note Due on Visit 10    PT Start Time 0918    PT Stop Time 0958    PT Time Calculation (min) 40 min    Activity Tolerance Patient tolerated treatment well    Behavior During Therapy [facility] for tasks assessed/performed                   Past Medical History:  Diagnosis Date   ALLERGIC RHINITIS    Hypertension    PONV (postoperative nausea and vomiting)    Right shoulder pain    Past Surgical History:  Procedure Laterality Date   ANKLE ARTHROSCOPY Left 01/03/2019   Procedure: LEFT ANKLE ARTHROSCOPY AND DEBRIDEMENT;  Surgeon: Newt Minion, MD;  Location: [place];  Service: Orthopedics;  Laterality: Left;   ANKLE SURGERY Left 2000   ELBOW ARTHROSCOPY Right 2019   TOE SURGERY Right 1998   bone spur   VAGINAL HYSTERECTOMY  1980   per Dr. Harrington Challenger for fibroids; no cancer   Patient Active Problem List   Diagnosis Date Noted   Lymphocytosis 11/20/2020   Impingement syndrome of left ankle 01/11/2018   Lateral epicondylitis (tennis elbow) 08/31/2016   Essential hypertension 12/06/2006   Allergic rhinitis 12/06/2006    REFERRING DIAG: M54.2 (ICD-10-CM) - Cervicalgia  THERAPY DIAG:  Cervicalgia  Muscle weakness (generalized)  Other low back pain  PERTINENT HISTORY:  HTN  SUBJECTIVE:  Pt reports experiencing increased LBP yesterday into this morning following standing for about an hour yesterday while cooking.   PAIN:  Are you having pain?  Yes: NPRS scale: 2/10 LBP, 2/10 Rt UT pain Pain description:  Sharp Aggravating factors: cervical movements, walking/ standing >15 minutes, laying on Rt side, laying supine Relieving factors: Alieve, heating pad   OBJECTIVE: (objective measures completed at initial evaluation unless otherwise dated)   OBJECTIVE:    PATIENT SURVEYS:  FOTO (09/30/2021): 52%, projected 62% in 12 visits 10/16/2021: 53%    PASSIVE ACCESSORIES: Hypomobile and painful CPAs C3-T8         CERVICAL ROM:    Active ROM AROM (deg) 09/18/2021 AROM 10/16/2021  Flexion 60 80  Extension 40p! 45p!  Right lateral flexion 40   Left lateral flexion 40   Right rotation 72   Left rotation 68    (Blank rows = not tested)   UE ROM:   Active/Passive ROM Right 09/18/2021 Left 09/18/2021 Right 10/16/2021  Shoulder flexion 135p!/ 170 WNL 160/  180  Shoulder abduction 145p! And audible click/ 097 WNL 353/299 minor pain at end range  Shoulder internal rotation 75 minor p!/ 90p! WNL 72/85  Shoulder external rotation 85/ 105 WNL 82/90   (Blank rows = not tested)   UE MMT:   MMT Right 09/18/2021 Left 09/18/2021 Right 10/16/2021 Left 10/16/2021  Shoulder flexion 5/5 5/5    Shoulder extension 5/5 5/5    Shoulder abduction 5/5 5/5    Shoulder ER 4/5p! 5/5 4+/5   Shoulder IR 5/5 5/5  Latissimus dorsi 3+/5 4/5 5/5 5/5  Mid trap 3/5p! 4/5 5/5 4+/5  Low trap 3/5 3+/5 4+/5 4+/5   (Blank rows = not tested)   CERVICAL SPECIAL TESTS:  Cervical quadrant testing: (+) to Rt Neer's: (-) on Rt Hawkin's-Kennedy: (+) on Rt     FUNCTIONAL TESTS:  DNF endurance testing: 30 seconds     TODAY'S TREATMENT:   [facility] Adult PT Treatment:                                                DATE: Nov 20, 2021 Therapeutic Exercise: Dead bug with green physioball 3x10 Seated green physioball trunk flexion stretch with side-to-side movement at end range 2x20 Seated alternating anterior/ posterior pelvic tilts 2x15 Seated Rt upper trap stretch x70mn  Standing hip extension with 10# cable at Free Motion machine  2x10 BIL Standing hip abduction with 10# cable at Free Motion machine 2x10 BIL Standing hip flexion with subsequent knee extension with 7# cable at Free Motion machine 2x10 BIL Manual Therapy: N/A Neuromuscular re-ed: N/A Therapeutic Activity: N/A Modalities: N/A Self Care: N/A   [facility]                                                [facility] Therapeutic Exercise: Alternating lunge with overhead pull x8 with 7# cables, x8 with 3# cables Straight arm reverse crunches with 2kg ball hand-to-feet hand-off 3x6 LTR 2x10 Standing Cybex hip abduction with 25# 2x10 BIL Standing Cybex hip extension with 25# cable 2x10 BIL Seated piriformis stretch x1 min BIL Manual Therapy: N/A Neuromuscular re-ed: N/A Therapeutic Activity: N/A Modalities: N/A Self Care: N/A   [facility] Adult PT Treatment:                                                DATE: 10/16/2021 Therapeutic Exercise: Quadruped chin tucks 3x15 Knee planks x3 to failure Side knee planks x2 to failure BIL Sidelying lumbar open books x10 BIL Manual Therapy: Prone CT junction grade V manipulation x1 BIL with cavitation Prone thoracic grade V manipulation x4 throughout thoracic spine with cavitation Sidelying lumbar roll grade V manipulation x1 BIL with cavitation Supine suboccipital release/ STM to BIL suboccipital musculature x6 minutes Neuromuscular re-ed: N/A Therapeutic Activity: Re-assessment of objective measures with pt education Re-administration of FOTO with pt education Modalities: N/A Self Care: N/A     PATIENT EDUCATION:  Education details: Pt educated on potential underlying pathophysiology behind her pain presentation, POC, prognosis, and HEP Person educated: Patient Education method: EConsulting civil engineer Demonstration, and Handouts Education comprehension: verbalized understanding and returned demonstration     HOME EXERCISE PROGRAM: Access Code: [email or web address]:  [email or web address] Date: 09/18/2021 Prepared by: TVanessa   Exercises - Seated Cervical Sidebending Stretch  - 1 x daily - 7 x weekly - 2-min hold - Standing Shoulder Row with Anchored Resistance  - 1 x daily - 7 x weekly - 3 sets - 10 reps - 3-sec hold - Seated Cervical Retraction  - 1 x daily - 7 x weekly - 3 sets - 10 reps - 5-sec hold -  Cervical Extension AROM with Strap  - 1 x daily - 7 x weekly - 10 reps - 10-sec hold  Added 09/30/2021: - Prone Swimmer  - 1 x daily - 7 x weekly - 3 sets - 20 reps  Added 10/16/2021:  - Plank on Knees  - 1 x daily - 7 x weekly - 3 sets - to failure hold - Side Plank on Knees  - 1 x daily - 7 x weekly - 2 sets - 10 reps - to failure hold   ASSESSMENT:   CLINICAL IMPRESSION: Pt responded well to all interventions today, demonstrating good form and no increase in pain with performed exercises. She reports a therapeutic response to stretches today and will continue to benefit from skilled PT to address her primary impairments and return to her prior level of function with less limitation.     OBJECTIVE IMPAIRMENTS decreased ROM, decreased strength, hypomobility, impaired flexibility, impaired UE functional use, improper body mechanics, postural dysfunction, and pain.    ACTIVITY LIMITATIONS cleaning, community activity, driving, laundry, yard work, and shopping.    PERSONAL FACTORS Age, Time since onset of injury/illness/exacerbation, and 1 comorbidity: HTN  are also affecting patient's functional outcome.          GOALS: Goals reviewed with patient? Yes   SHORT TERM GOALS: Target date: 10/16/2021   Pt will report understanding and adherence to her HEP in order to promote independence in the management of her primary impairments. Baseline: HEP provided at eval 10/16/2021: Pt reports daily HEP adherence Goal status: ACHIEVED       LONG TERM GOALS: Target date: 11/13/2021   Pt will achieve a FOTO score meeting her  predictive value in order to demonstrate improved functional ability as it relates to her primary impairments. Baseline: 52%, predicted 62% in 12 visits 10/16/2021: 53% Goal status: PROGRESSING   2.  Pt will report no headaches x3 weeks in order to complete ADLs with less limitation. Baseline: 2-3 debilitating headaches each month 10/16/2021: No headaches since beginning PT Goal status: ACHIEVED   3.  Pt will achieve cervical extension AROM of 60 degrees in order to look up into cabinets with less limitation. Baseline: 40 degrees 10/16/2021: 45 degrees Goal status: PROGRESSING 4.  Pt will achieve BIL global parascapular strength of 4+/5 in order to promote prophylactic measures in mechanical neck pain. Baseline: See MMT chart 10/16/2021: See updated MMT chart Goal status: ACHIEVED         PLAN: PT FREQUENCY: 2x/week   PT DURATION: 8 weeks   PLANNED INTERVENTIONS: Therapeutic exercises, Therapeutic activity, Neuromuscular re-education, Balance training, Gait training, Patient/Family education, Joint manipulation, Joint mobilization, Vestibular training, Dry Needling, Electrical stimulation, Spinal manipulation, Spinal mobilization, Cryotherapy, Moist heat, Taping, Traction, Biofeedback, Ionotophoresis '4mg'$ /ml Dexamethasone, and Manual therapy   PLAN FOR NEXT SESSION: Progress DNF/ parascapular strength, manual techniques including TPDN for pain relief, instructions on TPDN    Carolann Littler Mackinley Cassaday PT 10/23/21 10:45 AM

## 2021-11-12 NOTE — Therapy (Signed)
OUTPATIENT PHYSICAL THERAPY TREATMENT NOTE   Patient Name: Morgan Wolfe MRN: [id number] DOB:09-11-1955, 66 y.o., female Today's Date: 11/13/2021  PCP: Caren Macadam, MD REFERRING PROVIDER: Susa Day, MD  END OF SESSION:   PT End of Session - 11/13/21 0905     Visit Number 9    Number of Visits 17    Date for PT Re-Evaluation 11/20/21    Authorization Type [employer] MCR    Authorization Time Period FOTO v6,v10, KX mod v15    Progress Note Due on Visit 10    PT Start Time 0910    PT Stop Time 0950    PT Time Calculation (min) 40 min    Activity Tolerance Patient tolerated treatment well    Behavior During Therapy WFL for tasks assessed/performed                    Past Medical History:  Diagnosis Date   ALLERGIC RHINITIS    Hypertension    PONV (postoperative nausea and vomiting)    Right shoulder pain    Past Surgical History:  Procedure Laterality Date   ANKLE ARTHROSCOPY Left 01/03/2019   Procedure: LEFT ANKLE ARTHROSCOPY AND DEBRIDEMENT;  Surgeon: Newt Minion, MD;  Location: [place];  Service: Orthopedics;  Laterality: Left;   ANKLE SURGERY Left 2000   ELBOW ARTHROSCOPY Right 2019   TOE SURGERY Right 1998   bone spur   VAGINAL HYSTERECTOMY  1980   per Dr. Harrington Challenger for fibroids; no cancer   Patient Active Problem List   Diagnosis Date Noted   Lymphocytosis 11/20/2020   Impingement syndrome of left ankle 01/11/2018   Lateral epicondylitis (tennis elbow) 08/31/2016   Essential hypertension 12/06/2006   Allergic rhinitis 12/06/2006    REFERRING DIAG: M54.2 (ICD-10-CM) - Cervicalgia  THERAPY DIAG:  Cervicalgia  Muscle weakness (generalized)  Other low back pain  PERTINENT HISTORY:  HTN  SUBJECTIVE: Patient presents to PT after going to the eye doctor and having her eyes dilated, so she is not feeling great today. She states her pain has improved but is still there. PAIN:  Are you having pain?  Yes: NPRS scale: 5/10  LBP Pain description: Sharp Aggravating factors: cervical movements, walking/ standing >15 minutes, laying on Rt side, laying supine Relieving factors: Alieve, heating pad   OBJECTIVE: (objective measures completed at initial evaluation unless otherwise dated)   OBJECTIVE:    PATIENT SURVEYS:  FOTO (09/30/2021): 52%, projected 62% in 12 visits 10/16/2021: 53%     PASSIVE ACCESSORIES: Hypomobile and painful CPAs C3-T8         CERVICAL ROM:    Active ROM AROM (deg) 09/18/2021 AROM 10/16/2021  Flexion 60 80  Extension 40p! 45p!  Right lateral flexion 40   Left lateral flexion 40   Right rotation 72   Left rotation 68    (Blank rows = not tested)   UE ROM:   Active/Passive ROM Right 09/18/2021 Left 09/18/2021 Right 10/16/2021  Shoulder flexion 135p!/ 170 WNL 160/  180  Shoulder abduction 145p! And audible click/ 081 WNL 448/185 minor pain at end range  Shoulder internal rotation 75 minor p!/ 90p! WNL 72/85  Shoulder external rotation 85/ 105 WNL 82/90   (Blank rows = not tested)   UE MMT:   MMT Right 09/18/2021 Left 09/18/2021 Right 10/16/2021 Left 10/16/2021  Shoulder flexion 5/5 5/5    Shoulder extension 5/5 5/5    Shoulder abduction 5/5 5/5    Shoulder ER  4/5p! 5/5 4+/5   Shoulder IR 5/5 5/5    Latissimus dorsi 3+/5 4/5 5/5 5/5  Mid trap 3/5p! 4/5 5/5 4+/5  Low trap 3/5 3+/5 4+/5 4+/5   (Blank rows = not tested)   CERVICAL SPECIAL TESTS:  Cervical quadrant testing: (+) to Rt Neer's: (-) on Rt Hawkin's-Kennedy: (+) on Rt     FUNCTIONAL TESTS:  DNF endurance testing: 30 seconds     TODAY'S TREATMENT:  [facility] Adult PT Treatment:                                                DATE: Nov 25, 2021 Therapeutic Exercise: Dead bug with green physioball 2x10 Bridges 2x10 LTR x10 BIL Seated green physioball trunk flexion stretch with side-to-side movement at end range x10 each Seated alternating anterior/ posterior pelvic tilts 2x15 Seated upper trap stretch 2x30"  BIL Standing hip extension with 10# cable at Free Motion machine 2x10 BIL Standing hip abduction with 10# cable at Free Motion machine 2x10 BIL Standing hip flexion with subsequent knee extension with 7# cable at Free Motion machine 2x10 BIL   [facility] Adult PT Treatment:                                                DATE: November 04, 2021 Therapeutic Exercise: Dead bug with green physioball 3x10 Seated green physioball trunk flexion stretch with side-to-side movement at end range 2x20 Seated alternating anterior/ posterior pelvic tilts 2x15 Seated Rt upper trap stretch x72mn  Standing hip extension with 10# cable at Free Motion machine 2x10 BIL Standing hip abduction with 10# cable at Free Motion machine 2x10 BIL Standing hip flexion with subsequent knee extension with 7# cable at Free Motion machine 2x10 BIL Manual Therapy: N/A Neuromuscular re-ed: N/A Therapeutic Activity: N/A Modalities: N/A Self Care: N/A   [facility]                                                [facility]: 10/21/2021 Therapeutic Exercise: Alternating lunge with overhead pull x8 with 7# cables, x8 with 3# cables Straight arm reverse crunches with 2kg ball hand-to-feet hand-off 3x6 LTR 2x10 Standing Cybex hip abduction with 25# 2x10 BIL Standing Cybex hip extension with 25# cable 2x10 BIL Seated piriformis stretch x1 min BIL Manual Therapy: N/A Neuromuscular re-ed: N/A Therapeutic Activity: N/A Modalities: N/A Self Care: N/A   PATIENT EDUCATION:  Education details: Pt educated on potential underlying pathophysiology behind her pain presentation, POC, prognosis, and HEP Person educated: Patient Education method: Explanation, Demonstration, and Handouts Education comprehension: verbalized understanding and returned demonstration     HOME EXERCISE PROGRAM: Access Code: [email or web address]: [email or web address] Date: 09/18/2021 Prepared by: TVanessa [place]  Exercises - Seated  Cervical Sidebending Stretch  - 1 x daily - 7 x weekly - 2-min hold - Standing Shoulder Row with Anchored Resistance  - 1 x daily - 7 x weekly - 3 sets - 10 reps - 3-sec hold - Seated Cervical Retraction  - 1 x daily - 7 x weekly - 3 sets - 10 reps - 5-sec hold - Cervical  Extension AROM with Strap  - 1 x daily - 7 x weekly - 10 reps - 10-sec hold  Added 09/30/2021: - Prone Swimmer  - 1 x daily - 7 x weekly - 3 sets - 20 reps  Added 10/16/2021:  - Plank on Knees  - 1 x daily - 7 x weekly - 3 sets - to failure hold - Side Plank on Knees  - 1 x daily - 7 x weekly - 2 sets - 10 reps - to failure hold   ASSESSMENT:   CLINICAL IMPRESSION: Patient presents to PT with continued back pain but reports it has overall improved. She is not feeling well in general today due to just having left the eye doctor this AM and having her eyes dilated. Session today continued to focus on core and proximal hip strengthening and stretching for the lumbar spine and upper traps. Patient was able to tolerate all prescribed exercises with no adverse effects. Patient continues to benefit from skilled PT services and should be progressed as able to improve functional independence.     OBJECTIVE IMPAIRMENTS decreased ROM, decreased strength, hypomobility, impaired flexibility, impaired UE functional use, improper body mechanics, postural dysfunction, and pain.    ACTIVITY LIMITATIONS cleaning, community activity, driving, laundry, yard work, and shopping.    PERSONAL FACTORS Age, Time since onset of injury/illness/exacerbation, and 1 comorbidity: HTN  are also affecting patient's functional outcome.          GOALS: Goals reviewed with patient? Yes   SHORT TERM GOALS: Target date: 10/16/2021   Pt will report understanding and adherence to her HEP in order to promote independence in the management of her primary impairments. Baseline: HEP provided at eval 10/16/2021: Pt reports daily HEP adherence Goal status: ACHIEVED        LONG TERM GOALS: Target date: 11/13/2021   Pt will achieve a FOTO score meeting her predictive value in order to demonstrate improved functional ability as it relates to her primary impairments. Baseline: 52%, predicted 62% in 12 visits 10/16/2021: 53% Goal status: PROGRESSING   2.  Pt will report no headaches x3 weeks in order to complete ADLs with less limitation. Baseline: 2-3 debilitating headaches each month 10/16/2021: No headaches since beginning PT Goal status: ACHIEVED   3.  Pt will achieve cervical extension AROM of 60 degrees in order to look up into cabinets with less limitation. Baseline: 40 degrees 10/16/2021: 45 degrees Goal status: PROGRESSING 4.  Pt will achieve BIL global parascapular strength of 4+/5 in order to promote prophylactic measures in mechanical neck pain. Baseline: See MMT chart 10/16/2021: See updated MMT chart Goal status: ACHIEVED         PLAN: PT FREQUENCY: 2x/week   PT DURATION: 8 weeks   PLANNED INTERVENTIONS: Therapeutic exercises, Therapeutic activity, Neuromuscular re-education, Balance training, Gait training, Patient/Family education, Joint manipulation, Joint mobilization, Vestibular training, Dry Needling, Electrical stimulation, Spinal manipulation, Spinal mobilization, Cryotherapy, Moist heat, Taping, Traction, Biofeedback, Ionotophoresis '4mg'$ /ml Dexamethasone, and Manual therapy   PLAN FOR NEXT SESSION: Progress DNF/ parascapular strength, manual techniques including TPDN for pain relief, instructions on TPDN    Evelene Croon PTA 11/13/21 9:49 AM

## 2021-11-13 ENCOUNTER — Ambulatory Visit: Payer: Medicare HMO

## 2021-11-13 DIAGNOSIS — M6281 Muscle weakness (generalized): Secondary | ICD-10-CM

## 2021-11-13 DIAGNOSIS — M542 Cervicalgia: Secondary | ICD-10-CM | POA: Diagnosis not present

## 2021-11-13 DIAGNOSIS — M5459 Other low back pain: Secondary | ICD-10-CM

## 2021-11-21 ENCOUNTER — Ambulatory Visit: Payer: Medicare HMO | Attending: Specialist

## 2021-11-21 DIAGNOSIS — M6281 Muscle weakness (generalized): Secondary | ICD-10-CM | POA: Insufficient documentation

## 2021-11-21 DIAGNOSIS — M5459 Other low back pain: Secondary | ICD-10-CM | POA: Insufficient documentation

## 2021-11-21 DIAGNOSIS — M542 Cervicalgia: Secondary | ICD-10-CM | POA: Insufficient documentation

## 2021-11-21 NOTE — Therapy (Signed)
OUTPATIENT PHYSICAL THERAPY TREATMENT NOTE/ PROGRESS NOTE  Progress Note Reporting Period 09/18/2021 to 11/21/2021  See note below for Objective Data and Assessment of Progress/Goals.      Patient Name: Morgan Wolfe MRN: [id number] DOB:05/29/1955, 66 y.o., female Today's Date: 11/21/2021  PCP: Caren Macadam, MD REFERRING PROVIDER: Susa Day, MD  END OF SESSION:   PT End of Session - 11/21/21 1213     Visit Number 10    Number of Visits 18    Date for PT Re-Evaluation 01/16/22    Authorization Type [employer] MCR    Authorization Time Period FOTO v6,v10, KX mod v15    Progress Note Due on Visit 10    PT Start Time 1213    PT Stop Time 1255    PT Time Calculation (min) 42 min    Activity Tolerance Patient tolerated treatment well    Behavior During Therapy WFL for tasks assessed/performed                     Past Medical History:  Diagnosis Date   ALLERGIC RHINITIS    Hypertension    PONV (postoperative nausea and vomiting)    Right shoulder pain    Past Surgical History:  Procedure Laterality Date   ANKLE ARTHROSCOPY Left 01/03/2019   Procedure: LEFT ANKLE ARTHROSCOPY AND DEBRIDEMENT;  Surgeon: Newt Minion, MD;  Location: [place];  Service: Orthopedics;  Laterality: Left;   ANKLE SURGERY Left 2000   ELBOW ARTHROSCOPY Right 2019   TOE SURGERY Right 1998   bone spur   VAGINAL HYSTERECTOMY  1980   per Dr. Harrington Challenger for fibroids; no cancer   Patient Active Problem List   Diagnosis Date Noted   Lymphocytosis 11/20/2020   Impingement syndrome of left ankle 01/11/2018   Lateral epicondylitis (tennis elbow) 08/31/2016   Essential hypertension 12/06/2006   Allergic rhinitis 12/06/2006    REFERRING DIAG: M54.2 (ICD-10-CM) - Cervicalgia  THERAPY DIAG:  Cervicalgia - Plan: PT plan of care cert/re-cert  Muscle weakness (generalized) - Plan: PT plan of care cert/re-cert  Other low back pain - Plan: PT plan of care  cert/re-cert  PERTINENT HISTORY:  HTN  SUBJECTIVE: Pt reports 6/10 LBP and neck pain today, reporting increased pain when walking. She reports doing her HEP every day.  PAIN:  Are you having pain?  Yes: NPRS scale: 6/10 LBP and neck pain Pain description: Sharp Aggravating factors: cervical movements, walking/ standing >15 minutes, laying on Rt side, laying supine Relieving factors: Alieve, heating pad   OBJECTIVE: (objective measures completed at initial evaluation unless otherwise dated)   OBJECTIVE:    PATIENT SURVEYS:  FOTO (09/30/2021): 52%, projected 62% in 12 visits 10/16/2021: 53% 11/21/2021: 42%     PASSIVE ACCESSORIES: Hypomobile and painful CPAs C3-T8         CERVICAL ROM:    Active ROM AROM (deg) 09/18/2021 AROM 10/16/2021 AROM 11/21/2021  Flexion 60 80   Extension 40p! 45p! 50, no pain  Right lateral flexion 40    Left lateral flexion 40    Right rotation 72    Left rotation 68     (Blank rows = not tested)   UE ROM:   Active/Passive ROM Right 09/18/2021 Left 09/18/2021 Right 10/16/2021  Shoulder flexion 135p!/ 170 WNL 160/  180  Shoulder abduction 145p! And audible click/ 786 WNL 767/209 minor pain at end range  Shoulder internal rotation 75 minor p!/ 90p! WNL 72/85  Shoulder external rotation  85/ 105 WNL 82/90   (Blank rows = not tested)   UE MMT:   MMT Right 09/18/2021 Left 09/18/2021 Right 10/16/2021 Left 10/16/2021  Shoulder flexion 5/5 5/5    Shoulder extension 5/5 5/5    Shoulder abduction 5/5 5/5    Shoulder ER 4/5p! 5/5 4+/5   Shoulder IR 5/5 5/5    Latissimus dorsi 3+/5 4/5 5/5 5/5  Mid trap 3/5p! 4/5 5/5 4+/5  Low trap 3/5 3+/5 4+/5 4+/5   (Blank rows = not tested)   CERVICAL SPECIAL TESTS:  Cervical quadrant testing: (+) to Rt Neer's: (-) on Rt Hawkin's-Kennedy: (+) on Rt     FUNCTIONAL TESTS:  DNF endurance testing: 30 seconds  11/21/2021: DNF endurance testing: 60 seconds, terminated due to poor form     TODAY'S TREATMENT:   [facility]  Adult PT Treatment:                                                DATE: 11/21/2021 Therapeutic Exercise: Supine 90/90 abdominal isometric with handhold resistance x3 to failure Bridge isometric with marching 3x20 Sidelying hip abduction with GTB around thighs 2x10 BIL Sidelying IT band stretch with leg off table x22mn BIL Sidelying lumbar open book x10 BIL Supine figure 4 stretch x122m BIL DKTC stretch [id number] Therapy: N/A Neuromuscular re-ed: N/A Therapeutic Activity: Re-assessment of objective measures with pt education Re-administration of FOTO with pt education Modalities: N/A Self Care: N/A   [facility] Adult PT Treatment:                                                DATE: 6/207-17-23erapeutic Exercise: Dead bug with green physioball 2x10 Bridges 2x10 LTR x10 BIL Seated green physioball trunk flexion stretch with side-to-side movement at end range x10 each Seated alternating anterior/ posterior pelvic tilts 2x15 Seated upper trap stretch 2x30" BIL Standing hip extension with 10# cable at Free Motion machine 2x10 BIL Standing hip abduction with 10# cable at Free Motion machine 2x10 BIL Standing hip flexion with subsequent knee extension with 7# cable at Free Motion machine 2x10 BIL   [facility] Adult PT Treatment:                                                DATE: 6/826-Jun-2023erapeutic Exercise: Dead bug with green physioball 3x10 Seated green physioball trunk flexion stretch with side-to-side movement at end range 2x20 Seated alternating anterior/ posterior pelvic tilts 2x15 Seated Rt upper trap stretch [id number] hip extension with 10# cable at Free Motion machine 2x10 BIL Standing hip abduction with 10# cable at Free Motion machine 2x10 BIL Standing hip flexion with subsequent knee extension with 7# cable at Free Motion machine 2x10 BIL Manual Therapy: N/A Neuromuscular re-ed: N/A Therapeutic Activity: N/A Modalities: N/A Self Care: N/A     PATIENT  EDUCATION:  Education details: Pt educated on potential underlying pathophysiology behind her pain presentation, POC, prognosis, and HEP Person educated: Patient Education method: ExplConsulting civil engineermonstration, and Handouts Education comprehension: verbalized understanding and returned demonstration     HOME EXERCISE PROGRAM: Access Code:  [email or web address] URL: [email or web address] Date: 09/18/2021 Prepared by: Vanessa [place]   Exercises - Seated Cervical Sidebending Stretch  - 1 x daily - 7 x weekly - 2-min hold - Standing Shoulder Row with Anchored Resistance  - 1 x daily - 7 x weekly - 3 sets - 10 reps - 3-sec hold - Seated Cervical Retraction  - 1 x daily - 7 x weekly - 3 sets - 10 reps - 5-sec hold - Cervical Extension AROM with Strap  - 1 x daily - 7 x weekly - 10 reps - 10-sec hold  Added 09/30/2021: - Prone Swimmer  - 1 x daily - 7 x weekly - 3 sets - 20 reps  Added 10/16/2021:  - Plank on Knees  - 1 x daily - 7 x weekly - 3 sets - to failure hold - Side Plank on Knees  - 1 x daily - 7 x weekly - 2 sets - 10 reps - to failure hold   ASSESSMENT:   CLINICAL IMPRESSION: Pt responded well to all interventions today, demonstrating good form and no increase in pain with selected exercises. Exercises were regressed today due to recent exacerbation of concordant pain. Upon re-assessment of pt goals, to date, the pt has made good progress in cervical ROM, DNF strength, hip strength, scapular strength, and shoulder ROM. The pt remains limited by neck and back pain and will continue to benefit from skilled PT to address her primary impairments and return to her prior level of function with less limitation.    OBJECTIVE IMPAIRMENTS decreased ROM, decreased strength, hypomobility, impaired flexibility, impaired UE functional use, improper body mechanics, postural dysfunction, and pain.    ACTIVITY LIMITATIONS cleaning, community activity, driving, laundry, yard work, and shopping.     PERSONAL FACTORS Age, Time since onset of injury/illness/exacerbation, and 1 comorbidity: HTN  are also affecting patient's functional outcome.          GOALS: Goals reviewed with patient? Yes   SHORT TERM GOALS: Target date: 10/16/2021   Pt will report understanding and adherence to her HEP in order to promote independence in the management of her primary impairments. Baseline: HEP provided at eval 10/16/2021: Pt reports daily HEP adherence Goal status: ACHIEVED       LONG TERM GOALS: Target date: 11/13/2021   Pt will achieve a FOTO score meeting her predictive value in order to demonstrate improved functional ability as it relates to her primary impairments. Baseline: 52%, predicted 62% in 12 visits 10/16/2021: 53% 11/21/2021: 42% Goal status: PROGRESSING   2.  Pt will report no headaches x3 weeks in order to complete ADLs with less limitation. Baseline: 2-3 debilitating headaches each month 10/16/2021: No headaches since beginning PT Goal status: ACHIEVED   3.  Pt will achieve cervical extension AROM of 60 degrees in order to look up into cabinets with less limitation. Baseline: 40 degrees 10/16/2021: 45 degrees 11/20/2021: 50 degrees with no pain Goal status: PROGRESSING  4.  Pt will achieve BIL global parascapular strength of 4+/5 in order to promote prophylactic measures in mechanical neck pain. Baseline: See MMT chart 10/16/2021: See updated MMT chart Goal status: ACHIEVED  5. *New 11/21/2021* Pt will report average pain of 0-3/10 in order to return to independent workouts with less limitation.  Baseline: 6/10 baseline pain  Goal status: INITIAL         PLAN: PT FREQUENCY: 1x/week   PT DURATION: 8 weeks   PLANNED INTERVENTIONS: Therapeutic exercises, Therapeutic activity, Neuromuscular re-education, Balance training, Gait training,  Patient/Family education, Joint manipulation, Joint mobilization, Vestibular training, Dry Needling, Electrical stimulation, Spinal  manipulation, Spinal mobilization, Cryotherapy, Moist heat, Taping, Traction, Biofeedback, Ionotophoresis '4mg'$ /ml Dexamethasone, and Manual therapy   PLAN FOR NEXT SESSION: Progress DNF/ parascapular strength, core/ hip strength    Vanessa [place], PT, DPT 11/21/21 12:56 PM

## 2021-12-01 ENCOUNTER — Ambulatory Visit: Payer: Medicare HMO

## 2021-12-01 DIAGNOSIS — M5459 Other low back pain: Secondary | ICD-10-CM

## 2021-12-01 DIAGNOSIS — M542 Cervicalgia: Secondary | ICD-10-CM | POA: Diagnosis not present

## 2021-12-01 DIAGNOSIS — M6281 Muscle weakness (generalized): Secondary | ICD-10-CM

## 2021-12-01 NOTE — Therapy (Signed)
OUTPATIENT PHYSICAL THERAPY TREATMENT NOTE   Patient Name: Morgan Wolfe MRN: [id number] DOB:01/02/1956, 66 y.o., female Today's Date: 12/01/2021  PCP: Caren Macadam, MD REFERRING PROVIDER: Susa Day, MD  END OF SESSION:   PT End of Session - 12/01/21 0827     Visit Number 11    Number of Visits 18    Date for PT Re-Evaluation 01/16/22    Authorization Type [employer] MCR    Authorization Time Period FOTO v6,v10, KX mod v15    Progress Note Due on Visit 10    PT Start Time 0830    PT Stop Time 0908    PT Time Calculation (min) 38 min    Activity Tolerance Patient tolerated treatment well    Behavior During Therapy WFL for tasks assessed/performed                      Past Medical History:  Diagnosis Date   ALLERGIC RHINITIS    Hypertension    PONV (postoperative nausea and vomiting)    Right shoulder pain    Past Surgical History:  Procedure Laterality Date   ANKLE ARTHROSCOPY Left 01/03/2019   Procedure: LEFT ANKLE ARTHROSCOPY AND DEBRIDEMENT;  Surgeon: Newt Minion, MD;  Location: [place];  Service: Orthopedics;  Laterality: Left;   ANKLE SURGERY Left 2000   ELBOW ARTHROSCOPY Right 2019   TOE SURGERY Right 1998   bone spur   VAGINAL HYSTERECTOMY  1980   per Dr. Harrington Challenger for fibroids; no cancer   Patient Active Problem List   Diagnosis Date Noted   Lymphocytosis 11/20/2020   Impingement syndrome of left ankle 01/11/2018   Lateral epicondylitis (tennis elbow) 08/31/2016   Essential hypertension 12/06/2006   Allergic rhinitis 12/06/2006    REFERRING DIAG: M54.2 (ICD-10-CM) - Cervicalgia  THERAPY DIAG:  Cervicalgia  Muscle weakness (generalized)  Other low back pain  PERTINENT HISTORY:  HTN  SUBJECTIVE:  Pt presents to PT with reports of continued LBP. Has been compliant with HEP with no adverse effect. Pt is ready to begin PT at this time.   PAIN:  Are you having pain?  Yes: NPRS scale: 5/10 LBP  Pain  description: Sharp Aggravating factors: cervical movements, walking/ standing >15 minutes, laying on Rt side, laying supine Relieving factors: Alieve, heating pad   OBJECTIVE: (objective measures completed at initial evaluation unless otherwise dated)   PATIENT SURVEYS:  FOTO (09/30/2021): 52%, projected 62% in 12 visits 10/16/2021: 53% 11/21/2021: 42%   CERVICAL ROM:    Active ROM AROM (deg) 09/18/2021 AROM 10/16/2021 AROM 11/21/2021  Flexion 60 80   Extension 40p! 45p! 50, no pain  Right lateral flexion 40    Left lateral flexion 40    Right rotation 72    Left rotation 68     (Blank rows = not tested)   UE ROM:   Active/Passive ROM Right 09/18/2021 Left 09/18/2021 Right 10/16/2021  Shoulder flexion 135p!/ 170 WNL 160/  180  Shoulder abduction 145p! And audible click/ 253 WNL 664/403 minor pain at end range  Shoulder internal rotation 75 minor p!/ 90p! WNL 72/85  Shoulder external rotation 85/ 105 WNL 82/90   (Blank rows = not tested)   UE MMT:   MMT Right 09/18/2021 Left 09/18/2021 Right 10/16/2021 Left 10/16/2021  Shoulder flexion 5/5 5/5    Shoulder extension 5/5 5/5    Shoulder abduction 5/5 5/5    Shoulder ER 4/5p! 5/5 4+/5   Shoulder IR 5/5  5/5    Latissimus dorsi 3+/5 4/5 5/5 5/5  Mid trap 3/5p! 4/5 5/5 4+/5  Low trap 3/5 3+/5 4+/5 4+/5   (Blank rows = not tested)   FUNCTIONAL TESTS:  11/21/2021: DNF endurance testing: 60 seconds, terminated due to poor form     TODAY'S TREATMENT:  [facility] Adult PT Treatment:                                                DATE: 12/04/21 Therapeutic Exercise: Dead bug 2x10 Bridges 2x15 Supine DKTC with physioball x 15 LTR x10 BIL Supine PPT x 10 - 5" hold Supine PPT with ball 2x10 - 5" hold Seated green physioball trunk flexion stretch with side-to-side movement at end range x 10 each STS x 10 - no UE support Lateral walk RTB at counter x 3 laps Standing hip abd/ext 2x10 RTB  [facility] Adult PT Treatment:                                                 DATE: 11/21/2021 Therapeutic Exercise: Supine 90/90 abdominal isometric with handhold resistance x3 to failure Bridge isometric with marching 3x20 Sidelying hip abduction with GTB around thighs 2x10 BIL Sidelying IT band stretch with leg off table x34mn BIL Sidelying lumbar open book x10 BIL Supine figure 4 stretch x136m BIL DKTC stretch x2m50mTherapeutic Activity: Re-assessment of objective measures with pt education Re-administration of FOTO with pt education   [facility]                                                DATE: 6/207/02/2023erapeutic Exercise: Dead bug with green physioball 2x10 Bridges 2x10 LTR x10 BIL Seated green physioball trunk flexion stretch with side-to-side movement at end range x10 each Seated alternating anterior/ posterior pelvic tilts 2x15 Seated upper trap stretch 2x30" BIL Standing hip extension with 10# cable at Free Motion machine 2x10 BIL Standing hip abduction with 10# cable at Free Motion machine 2x10 BIL Standing hip flexion with subsequent knee extension with 7# cable at Free Motion machine 2x10 BIL   PATIENT EDUCATION:  Education details: Pt educated on potential underlying pathophysiology behind her pain presentation, POC, prognosis, and HEP Person educated: Patient Education method: Explanation, Demonstration, and Handouts Education comprehension: verbalized understanding and returned demonstration     HOME EXERCISE PROGRAM: Access Code: [email or web address]: [email or web address] Date: 09/18/2021 Prepared by: TucVanessa DurhamExercises - Seated Cervical Sidebending Stretch  - 1 x daily - 7 x weekly - 2-min hold - Standing Shoulder Row with Anchored Resistance  - 1 x daily - 7 x weekly - 3 sets - 10 reps - 3-sec hold - Seated Cervical Retraction  - 1 x daily - 7 x weekly - 3 sets - 10 reps - 5-sec hold - Cervical Extension AROM with Strap  - 1 x daily - 7 x weekly - 10 reps - 10-sec hold  Added  09/30/2021: - Prone Swimmer  - 1 x daily - 7 x weekly - 3 sets - 20 reps  Added 10/16/2021:  - PRudean Haskell  on Knees  - 1 x daily - 7 x weekly - 3 sets - to failure hold - Side Plank on Knees  - 1 x daily - 7 x weekly - 2 sets - 10 reps - to failure hold   ASSESSMENT:   CLINICAL IMPRESSION: Pt was able to complete all prescribed exercises with no adverse effect or increase in pain. Therapy focused on improving core endurance and lumbar mobility in order to decrease pain and improve function. Pt continues to benefit from skilled PT and will continue to be seen and progressed as able per POC.     OBJECTIVE IMPAIRMENTS decreased ROM, decreased strength, hypomobility, impaired flexibility, impaired UE functional use, improper body mechanics, postural dysfunction, and pain.    ACTIVITY LIMITATIONS cleaning, community activity, driving, laundry, yard work, and shopping.    PERSONAL FACTORS Age, Time since onset of injury/illness/exacerbation, and 1 comorbidity: HTN  are also affecting patient's functional outcome.          GOALS: Goals reviewed with patient? Yes   SHORT TERM GOALS: Target date: 10/16/2021   Pt will report understanding and adherence to her HEP in order to promote independence in the management of her primary impairments. Baseline: HEP provided at eval 10/16/2021: Pt reports daily HEP adherence Goal status: ACHIEVED       LONG TERM GOALS: Target date: 11/13/2021   Pt will achieve a FOTO score meeting her predictive value in order to demonstrate improved functional ability as it relates to her primary impairments. Baseline: 52%, predicted 62% in 12 visits 10/16/2021: 53% 11/21/2021: 42% Goal status: PROGRESSING   2.  Pt will report no headaches x3 weeks in order to complete ADLs with less limitation. Baseline: 2-3 debilitating headaches each month 10/16/2021: No headaches since beginning PT Goal status: ACHIEVED   3.  Pt will achieve cervical extension AROM of 60 degrees in order  to look up into cabinets with less limitation. Baseline: 40 degrees 10/16/2021: 45 degrees 11/20/2021: 50 degrees with no pain Goal status: PROGRESSING  4.  Pt will achieve BIL global parascapular strength of 4+/5 in order to promote prophylactic measures in mechanical neck pain. Baseline: See MMT chart 10/16/2021: See updated MMT chart Goal status: ACHIEVED  5. *New 11/21/2021* Pt will report average pain of 0-3/10 in order to return to independent workouts with less limitation.  Baseline: 6/10 baseline pain  Goal status: INITIAL         PLAN: PT FREQUENCY: 1x/week   PT DURATION: 8 weeks   PLANNED INTERVENTIONS: Therapeutic exercises, Therapeutic activity, Neuromuscular re-education, Balance training, Gait training, Patient/Family education, Joint manipulation, Joint mobilization, Vestibular training, Dry Needling, Electrical stimulation, Spinal manipulation, Spinal mobilization, Cryotherapy, Moist heat, Taping, Traction, Biofeedback, Ionotophoresis '4mg'$ /ml Dexamethasone, and Manual therapy   PLAN FOR NEXT SESSION: Progress DNF/ parascapular strength, core/ hip strength   Ward Chatters PT  12/01/21 9:08 AM

## 2021-12-09 ENCOUNTER — Ambulatory Visit (INDEPENDENT_AMBULATORY_CARE_PROVIDER_SITE_OTHER): Payer: Medicare HMO

## 2021-12-09 ENCOUNTER — Ambulatory Visit (INDEPENDENT_AMBULATORY_CARE_PROVIDER_SITE_OTHER): Payer: Medicare HMO | Admitting: Podiatry

## 2021-12-09 DIAGNOSIS — M2142 Flat foot [pes planus] (acquired), left foot: Secondary | ICD-10-CM | POA: Diagnosis not present

## 2021-12-09 DIAGNOSIS — M1000 Idiopathic gout, unspecified site: Secondary | ICD-10-CM | POA: Diagnosis not present

## 2021-12-09 DIAGNOSIS — M2141 Flat foot [pes planus] (acquired), right foot: Secondary | ICD-10-CM

## 2021-12-09 DIAGNOSIS — M7752 Other enthesopathy of left foot: Secondary | ICD-10-CM | POA: Diagnosis not present

## 2021-12-09 DIAGNOSIS — M79672 Pain in left foot: Secondary | ICD-10-CM

## 2021-12-09 MED ORDER — MELOXICAM 7.5 MG PO TABS: 7.5000 mg | ORAL_TABLET | Freq: Every day | ORAL | 0 refills | Status: DC | PRN

## 2021-12-09 NOTE — Progress Notes (Signed)
Subjective:   Patient ID: Morgan Wolfe, female   DOB: 66 y.o.   MRN: [id number]   HPI Chief Complaint  Patient presents with   Toe Pain    PT CAME IN TODAY FOR LEFT FOOT HALLUX PAIN, the joint of the toe hurts, aches feels like a nerve pain,  WHICH STARTED IN 3 MTHS AGO, pain rate 3 out of 10, Xrays done  tx: diclofenac    66 year old female presents the above complaint.  Patient states this started in April. She was going to therapy for her back and she mentioned this to them but they didn't know what to do for the toe. No sharp pain running down the leg/foot. She describes achy sensation. She states when she turns in the bed and the sheets touched it it hurt. She thinks it was swollen one time, but no redness. No tratement otherwise. No injury.   Previously had pain on the right big toe but not now. No injury.    Review of Systems  All other systems reviewed and are negative.  Past Medical History:  Diagnosis Date   ALLERGIC RHINITIS    Hypertension    PONV (postoperative nausea and vomiting)    Right shoulder pain     Past Surgical History:  Procedure Laterality Date   ANKLE ARTHROSCOPY Left 01/03/2019   Procedure: LEFT ANKLE ARTHROSCOPY AND DEBRIDEMENT;  Surgeon: Newt Minion, MD;  Location: [place];  Service: Orthopedics;  Laterality: Left;   ANKLE SURGERY Left 2000   ELBOW ARTHROSCOPY Right 2019   TOE SURGERY Right 1998   bone spur   VAGINAL HYSTERECTOMY  1980   per Dr. Harrington Challenger for fibroids; no cancer     Current Outpatient Medications:    meloxicam (MOBIC) 7.5 MG tablet, Take 1 tablet (7.5 mg total) by mouth daily as needed for pain., Disp: 30 tablet, Rfl: 0   estradiol (ESTRACE VAGINAL) 0.1 MG/GM vaginal cream, Apply one applicator per vagina daily for 2 weeks and then one applicator per vagina three times weekly, Disp: 42.5 g, Rfl: 11   fluticasone (FLONASE) 50 MCG/ACT nasal spray, SHAKE LIQUID AND USE 2 SPRAYS IN EACH NOSTRIL DAILY, Disp: 16 g,  Rfl: 5   loratadine (CLARITIN) 10 MG tablet, Take 1 tablet (10 mg total) by mouth daily., Disp: 90 tablet, Rfl: 1   meclizine (ANTIVERT) 25 MG tablet, Take 1 tablet (25 mg total) by mouth 3 (three) times daily as needed for dizziness., Disp: 30 tablet, Rfl: 4   triamterene-hydrochlorothiazide (DYAZIDE) 37.5-25 MG capsule, Take 1 each (1 capsule total) by mouth daily., Disp: 90 capsule, Rfl: 1  Allergies  Allergen Reactions   Augmentin [Amoxicillin-Pot Clavulanate] Nausea And Vomiting    Diarrhea    Sulfonamide Derivatives Itching           Objective:  Physical Exam  General: AAO x3, NAD  Dermatological: Skin is warm, dry and supple bilateral. There are no open sores, no preulcerative lesions, no rash or signs of infection present.  Vascular: Dorsalis Pedis artery and Posterior Tibial artery pedal pulses are 2/4 bilateral with immedate capillary fill time.There is no pain with calf compression, swelling, warmth, erythema.   Neruologic: Grossly intact via light touch bilateral.  Sensation intact with Semmes Weinstein monofilament.  Musculoskeletal: Decreased medial arch upon weightbearing.  There is tenderness palpation of the hallux IPJ of the left foot.  Prominence on the plantar aspect of the toe in the area of a prominent sesamoid on the hallux  IPJ.  There is trace edema there is no erythema warmth.  Muscular strength 5/5 in all groups tested bilateral.  Gait: Unassisted, Nonantalgic.       Assessment:   Left hallux capsulitis, flatfoot    Plan:  -Treatment options discussed including all alternatives, risks, and complications -Etiology of symptoms were discussed -X-rays were obtained and reviewed with the patient.  3 views of the left foot were obtained.  There is no evidence of fracture.  Flatfoot is present.  Sesamoid on the hallux IPJ.  -Check a uric acid level to rule out gout. -Discussed wearing shoes and good arch support.  Dispensed offloading pads.  Discussed  anti-inflammatories.  Meloxicam as needed.  If needed she can use Voltaren gel if not doing oral medication. Trula Slade DPM

## 2021-12-09 NOTE — Patient Instructions (Signed)
If you are not taking the meloxicam  you can use VOLTAREN GEL  Continue with shoes and good arch support. Do not go barefoot or wear flat shoes  Use the pads as needed  Let's check your uric acid to rule out gout  If was nice to meet you today. If you have any questions or any further concerns, please feel fee to give me a call. You can call our office at [phone] or please feel fee to send me a message through [facility].

## 2021-12-11 ENCOUNTER — Other Ambulatory Visit: Payer: Self-pay | Admitting: Podiatry

## 2021-12-11 ENCOUNTER — Ambulatory Visit: Payer: Medicare HMO

## 2021-12-11 DIAGNOSIS — M542 Cervicalgia: Secondary | ICD-10-CM

## 2021-12-11 DIAGNOSIS — M6281 Muscle weakness (generalized): Secondary | ICD-10-CM

## 2021-12-11 DIAGNOSIS — M5459 Other low back pain: Secondary | ICD-10-CM

## 2021-12-11 NOTE — Therapy (Signed)
OUTPATIENT PHYSICAL THERAPY TREATMENT NOTE   Patient Name: Morgan Wolfe MRN: [id number] DOB:1956-05-07, 66 y.o., female Today's Date: 12/11/2021  PCP: Caren Macadam, MD REFERRING PROVIDER: Susa Day, MD  END OF SESSION:   PT End of Session - 12/11/21 0955     Visit Number 12    Number of Visits 18    Date for PT Re-Evaluation 01/16/22    Authorization Type [employer] MCR    Authorization Time Period FOTO v6,v10, KX mod v15    Progress Note Due on Visit 10    PT Start Time 1000    PT Stop Time 1040    PT Time Calculation (min) 40 min    Activity Tolerance Patient tolerated treatment well    Behavior During Therapy WFL for tasks assessed/performed                       Past Medical History:  Diagnosis Date   ALLERGIC RHINITIS    Hypertension    PONV (postoperative nausea and vomiting)    Right shoulder pain    Past Surgical History:  Procedure Laterality Date   ANKLE ARTHROSCOPY Left 01/03/2019   Procedure: LEFT ANKLE ARTHROSCOPY AND DEBRIDEMENT;  Surgeon: Newt Minion, MD;  Location: [place];  Service: Orthopedics;  Laterality: Left;   ANKLE SURGERY Left 2000   ELBOW ARTHROSCOPY Right 2019   TOE SURGERY Right 1998   bone spur   VAGINAL HYSTERECTOMY  1980   per Dr. Harrington Challenger for fibroids; no cancer   Patient Active Problem List   Diagnosis Date Noted   Lymphocytosis 11/20/2020   Impingement syndrome of left ankle 01/11/2018   Lateral epicondylitis (tennis elbow) 08/31/2016   Essential hypertension 12/06/2006   Allergic rhinitis 12/06/2006    REFERRING DIAG: M54.2 (ICD-10-CM) - Cervicalgia  THERAPY DIAG:  Cervicalgia  Muscle weakness (generalized)  Other low back pain  PERTINENT HISTORY:  HTN  SUBJECTIVE:  Pt reports continued LBP when walking. She reports she recently was prescribed a steroid for her toe pain, which has also helped her back pain. She reports being able to turn in bed now, which she could not do  previously without pain.   PAIN:  Are you having pain?  Yes: NPRS scale: 2/10 LBP  Pain description: Sharp Aggravating factors: cervical movements, walking/ standing >15 minutes, laying on Rt side, laying supine Relieving factors: Alieve, heating pad   OBJECTIVE: (objective measures completed at initial evaluation unless otherwise dated)   PATIENT SURVEYS:  FOTO (09/30/2021): 52%, projected 62% in 12 visits 10/16/2021: 53% 11/21/2021: 42%   CERVICAL ROM:    Active ROM AROM (deg) 09/18/2021 AROM 10/16/2021 AROM 11/21/2021  Flexion 60 80   Extension 40p! 45p! 50, no pain  Right lateral flexion 40    Left lateral flexion 40    Right rotation 72    Left rotation 68     (Blank rows = not tested)   UE ROM:   Active/Passive ROM Right 09/18/2021 Left 09/18/2021 Right 10/16/2021  Shoulder flexion 135p!/ 170 WNL 160/  180  Shoulder abduction 145p! And audible click/ 144 WNL 818/563 minor pain at end range  Shoulder internal rotation 75 minor p!/ 90p! WNL 72/85  Shoulder external rotation 85/ 105 WNL 82/90   (Blank rows = not tested)   UE MMT:   MMT Right 09/18/2021 Left 09/18/2021 Right 10/16/2021 Left 10/16/2021  Shoulder flexion 5/5 5/5    Shoulder extension 5/5 5/5    Shoulder  abduction 5/5 5/5    Shoulder ER 4/5p! 5/5 4+/5   Shoulder IR 5/5 5/5    Latissimus dorsi 3+/5 4/5 5/5 5/5  Mid trap 3/5p! 4/5 5/5 4+/5  Low trap 3/5 3+/5 4+/5 4+/5   (Blank rows = not tested)   FUNCTIONAL TESTS:  11/21/2021: DNF endurance testing: 60 seconds, terminated due to poor form     TODAY'S TREATMENT:   [facility] Adult PT Treatment:                                                DATE: 12/11/2021 Therapeutic Exercise: Straight-arm reverse crunches 3x8 DKTC stretch 2x84mn Side knee plank with hip abduction 2x10 BIL 25# Kettlebell deadlift 3x8 Standing Pallof press with 10# cable 2x10 with 5-sec hold Standing straight-leg trunk extension with 10# cable from floor 2x10 Manual Therapy: N/A Neuromuscular  re-ed: N/A Therapeutic Activity: N/A Modalities: N/A Self Care: N/A   [facility] Adult PT Treatment:                                                DATE: 708-11-2023Therapeutic Exercise: Dead bug 2x10 Bridges 2x15 Supine DKTC with physioball x 15 LTR x10 BIL Supine PPT x 10 - 5" hold Supine PPT with ball 2x10 - 5" hold Seated green physioball trunk flexion stretch with side-to-side movement at end range x 10 each STS x 10 - no UE support Lateral walk RTB at counter x 3 laps Standing hip abd/ext 2x10 RTB  [facility] Adult PT Treatment:                                                DATE: 11/21/2021 Therapeutic Exercise: Supine 90/90 abdominal isometric with handhold resistance x3 to failure Bridge isometric with marching 3x20 Sidelying hip abduction with GTB around thighs 2x10 BIL Sidelying IT band stretch with leg off table x158m BIL Sidelying lumbar open book x10 BIL Supine figure 4 stretch x1m79mBIL DKTC stretch x2mi40mherapeutic Activity: Re-assessment of objective measures with pt education Re-administration of FOTO with pt education     PATIENT EDUCATION:  Education details: Pt educated on potential underlying pathophysiology behind her pain presentation, POC, prognosis, and HEP Person educated: Patient Education method: ExplConsulting civil engineermonstration, and Handouts Education comprehension: verbalized understanding and returned demonstration     HOME EXERCISE PROGRAM: Access Code: [email or web address]: [email or web address] Date: 09/18/2021 Prepared by: TuckVanessa Durhamxercises - Seated Cervical Sidebending Stretch  - 1 x daily - 7 x weekly - 2-min hold - Standing Shoulder Row with Anchored Resistance  - 1 x daily - 7 x weekly - 3 sets - 10 reps - 3-sec hold - Seated Cervical Retraction  - 1 x daily - 7 x weekly - 3 sets - 10 reps - 5-sec hold - Cervical Extension AROM with Strap  - 1 x daily - 7 x weekly - 10 reps - 10-sec hold  Added 09/30/2021: - Prone Swimmer   - 1 x daily - 7 x weekly - 3 sets - 20 reps  Added 10/16/2021:  - Plank on Knees  - 1 x  daily - 7 x weekly - 3 sets - to failure hold - Side Plank on Knees  - 1 x daily - 7 x weekly - 2 sets - 10 reps - to failure hold   ASSESSMENT:   CLINICAL IMPRESSION: Pt responded well to all interventions today, demonstrating good form and no increase in pain with progressed exercises.There is a noted pattern of decreased pain since beginning PT. She will continue to benefit from skilled PT to address her primary impairments and return to her prior level of function with less limitation.     OBJECTIVE IMPAIRMENTS decreased ROM, decreased strength, hypomobility, impaired flexibility, impaired UE functional use, improper body mechanics, postural dysfunction, and pain.    ACTIVITY LIMITATIONS cleaning, community activity, driving, laundry, yard work, and shopping.    PERSONAL FACTORS Age, Time since onset of injury/illness/exacerbation, and 1 comorbidity: HTN  are also affecting patient's functional outcome.          GOALS: Goals reviewed with patient? Yes   SHORT TERM GOALS: Target date: 10/16/2021   Pt will report understanding and adherence to her HEP in order to promote independence in the management of her primary impairments. Baseline: HEP provided at eval 10/16/2021: Pt reports daily HEP adherence Goal status: ACHIEVED       LONG TERM GOALS: Target date: 11/13/2021   Pt will achieve a FOTO score meeting her predictive value in order to demonstrate improved functional ability as it relates to her primary impairments. Baseline: 52%, predicted 62% in 12 visits 10/16/2021: 53% 11/21/2021: 42% Goal status: PROGRESSING   2.  Pt will report no headaches x3 weeks in order to complete ADLs with less limitation. Baseline: 2-3 debilitating headaches each month 10/16/2021: No headaches since beginning PT Goal status: ACHIEVED   3.  Pt will achieve cervical extension AROM of 60 degrees in order to look  up into cabinets with less limitation. Baseline: 40 degrees 10/16/2021: 45 degrees 11/20/2021: 50 degrees with no pain Goal status: PROGRESSING  4.  Pt will achieve BIL global parascapular strength of 4+/5 in order to promote prophylactic measures in mechanical neck pain. Baseline: See MMT chart 10/16/2021: See updated MMT chart Goal status: ACHIEVED  5. *New 11/21/2021* Pt will report average pain of 0-3/10 in order to return to independent workouts with less limitation.  Baseline: 6/10 baseline pain  Goal status: INITIAL         PLAN: PT FREQUENCY: 1x/week   PT DURATION: 8 weeks   PLANNED INTERVENTIONS: Therapeutic exercises, Therapeutic activity, Neuromuscular re-education, Balance training, Gait training, Patient/Family education, Joint manipulation, Joint mobilization, Vestibular training, Dry Needling, Electrical stimulation, Spinal manipulation, Spinal mobilization, Cryotherapy, Moist heat, Taping, Traction, Biofeedback, Ionotophoresis '4mg'$ /ml Dexamethasone, and Manual therapy   PLAN FOR NEXT SESSION: Progress DNF/ parascapular strength, core/ hip strength   Vanessa Riner, PT, DPT 12/11/21 10:41 AM

## 2021-12-12 LAB — URIC ACID: Uric Acid: 7.8 mg/dL — ABNORMAL HIGH (ref 3.0–7.2)

## 2021-12-14 ENCOUNTER — Other Ambulatory Visit: Payer: Self-pay | Admitting: Podiatry

## 2021-12-14 DIAGNOSIS — M1000 Idiopathic gout, unspecified site: Secondary | ICD-10-CM

## 2021-12-14 DIAGNOSIS — M7752 Other enthesopathy of left foot: Secondary | ICD-10-CM

## 2021-12-14 MED ORDER — ALLOPURINOL 100 MG PO TABS: 100.0000 mg | ORAL_TABLET | Freq: Every day | ORAL | 1 refills | Status: AC

## 2021-12-15 ENCOUNTER — Other Ambulatory Visit: Payer: Self-pay | Admitting: Podiatry

## 2021-12-18 ENCOUNTER — Ambulatory Visit: Payer: Medicare HMO | Attending: Specialist

## 2021-12-18 DIAGNOSIS — M6281 Muscle weakness (generalized): Secondary | ICD-10-CM | POA: Insufficient documentation

## 2021-12-18 DIAGNOSIS — M542 Cervicalgia: Secondary | ICD-10-CM | POA: Diagnosis present

## 2021-12-18 DIAGNOSIS — M5459 Other low back pain: Secondary | ICD-10-CM | POA: Insufficient documentation

## 2021-12-18 NOTE — Therapy (Signed)
OUTPATIENT PHYSICAL THERAPY TREATMENT NOTE   Patient Name: Morgan Wolfe MRN: [id number] DOB:[birth year], 66 y.o., female Today's Date: 12/18/2021  PCP: Caren Macadam, MD REFERRING PROVIDER: Susa Day, MD  END OF SESSION:   PT End of Session - 12/18/21 0919     Visit Number 13    Number of Visits 18    Date for PT Re-Evaluation 01/16/22    Authorization Type [employer] MCR    Authorization Time Period FOTO v6,v10, KX mod v15    Progress Note Due on Visit 10    PT Start Time 0916    PT Stop Time 0956    PT Time Calculation (min) 40 min    Activity Tolerance Patient tolerated treatment well    Behavior During Therapy [facility] for tasks assessed/performed                        Past Medical History:  Diagnosis Date   ALLERGIC RHINITIS    Hypertension    PONV (postoperative nausea and vomiting)    Right shoulder pain    Past Surgical History:  Procedure Laterality Date   ANKLE ARTHROSCOPY Left 01/03/2019   Procedure: LEFT ANKLE ARTHROSCOPY AND DEBRIDEMENT;  Surgeon: Newt Minion, MD;  Location: [place];  Service: Orthopedics;  Laterality: Left;   ANKLE SURGERY Left 2000   ELBOW ARTHROSCOPY Right 2019   TOE SURGERY Right 1998   bone spur   VAGINAL HYSTERECTOMY  1980   per Dr. Harrington Challenger for fibroids; no cancer   Patient Active Problem List   Diagnosis Date Noted   Lymphocytosis 11/20/2020   Impingement syndrome of left ankle 01/11/2018   Lateral epicondylitis (tennis elbow) 08/31/2016   Essential hypertension 12/06/2006   Allergic rhinitis 12/06/2006    REFERRING DIAG: M54.2 (ICD-10-CM) - Cervicalgia  THERAPY DIAG:  Cervicalgia  Muscle weakness (generalized)  Other low back pain  PERTINENT HISTORY:  HTN  SUBJECTIVE:  Pt reports increased soreness following her last appointment. However, she reports she can tell she is getting better, reporting just 1/10 pain today. Pt reports continued moderate pain with overhead  lifting.  PAIN:  Are you having pain?  Yes: NPRS scale: 1/10 LBP  Pain description: Sharp Aggravating factors: cervical movements, walking/ standing >15 minutes, laying on Rt side, laying supine Relieving factors: Alieve, heating pad   OBJECTIVE: (objective measures completed at initial evaluation unless otherwise dated)   PATIENT SURVEYS:  FOTO (09/30/2021): 52%, projected 62% in 12 visits 10/16/2021: 53% 11/21/2021: 42%   CERVICAL ROM:    Active ROM AROM (deg) 09/18/2021 AROM 10/16/2021 AROM 11/21/2021  Flexion 60 80   Extension 40p! 45p! 50, no pain  Right lateral flexion 40    Left lateral flexion 40    Right rotation 72    Left rotation 68     (Blank rows = not tested)   UE ROM:   Active/Passive ROM Right 09/18/2021 Left 09/18/2021 Right 10/16/2021  Shoulder flexion 135p!/ 170 WNL 160/  180  Shoulder abduction 145p! And audible click/ 510 WNL 258/527 minor pain at end range  Shoulder internal rotation 75 minor p!/ 90p! WNL 72/85  Shoulder external rotation 85/ 105 WNL 82/90   (Blank rows = not tested)   UE MMT:   MMT Right 09/18/2021 Left 09/18/2021 Right 10/16/2021 Left 10/16/2021  Shoulder flexion 5/5 5/5    Shoulder extension 5/5 5/5    Shoulder abduction 5/5 5/5    Shoulder ER 4/5p! 5/5 4+/5  Shoulder IR 5/5 5/5    Latissimus dorsi 3+/5 4/5 5/5 5/5  Mid trap 3/5p! 4/5 5/5 4+/5  Low trap 3/5 3+/5 4+/5 4+/5   (Blank rows = not tested)   FUNCTIONAL TESTS:  11/21/2021: DNF endurance testing: 60 seconds, terminated due to poor form     TODAY'S TREATMENT:   [facility] Adult PT Treatment:                                                DATE: 12/18/2021 Therapeutic Exercise: Prone swimmers with full can 3x10 BIL Alternating lunge with overhead pull with 3# cables at [employer] machine 3x10 Standing Rt shoulder cross-body adduction stretch 2x68min Standing BIL shoulder scaption with 2# dumbbells with back at wall 3x10 Knee planks 3x30 seconds DKTC stretch x43min  Manual  Therapy: Lt sidelying Rt scapular protaction/ flexion contract/relax MET x5 with 20-second hold at end range Lt sidelying Rt mid trap/rhomboids/teres/ supraspinatus STM Neuromuscular re-ed: N/A Therapeutic Activity: N/A Modalities: N/A Self Care: N/A  [facility] Adult PT Treatment:                                                DATE: 12/11/2021 Therapeutic Exercise: Straight-arm reverse crunches 3x8 DKTC stretch 2x86min Side knee plank with hip abduction 2x10 BIL 25# Kettlebell deadlift 3x8 Standing Pallof press with 10# cable 2x10 with 5-sec hold Standing straight-leg trunk extension with 10# cable from floor 2x10 Manual Therapy: N/A Neuromuscular re-ed: N/A Therapeutic Activity: N/A Modalities: N/A Self Care: N/A   [facility] Adult PT Treatment:                                                DATE: 12/22/2021 Therapeutic Exercise: Dead bug 2x10 Bridges 2x15 Supine DKTC with physioball x 15 LTR x10 BIL Supine PPT x 10 - 5" hold Supine PPT with ball 2x10 - 5" hold Seated green physioball trunk flexion stretch with side-to-side movement at end range x 10 each STS x 10 - no UE support Lateral walk RTB at counter x 3 laps Standing hip abd/ext 2x10 RTB    PATIENT EDUCATION:  Education details: Pt educated on potential underlying pathophysiology behind her pain presentation, POC, prognosis, and HEP Person educated: Patient Education method: Explanation, Demonstration, and Handouts Education comprehension: verbalized understanding and returned demonstration     HOME EXERCISE PROGRAM: Access Code: [email or web address] URL: [email or web address] Date: 09/18/2021 Prepared by: Vanessa [place]   Exercises - Seated Cervical Sidebending Stretch  - 1 x daily - 7 x weekly - 2-min hold - Standing Shoulder Row with Anchored Resistance  - 1 x daily - 7 x weekly - 3 sets - 10 reps - 3-sec hold - Seated Cervical Retraction  - 1 x daily - 7 x weekly - 3 sets - 10 reps - 5-sec hold -  Cervical Extension AROM with Strap  - 1 x daily - 7 x weekly - 10 reps - 10-sec hold  Added 09/30/2021: - Prone Swimmer  - 1 x daily - 7 x weekly - 3 sets - 20 reps  Added 10/16/2021:  - Plank on Knees  -  1 x daily - 7 x weekly - 3 sets - to failure hold - Side Plank on Knees  - 1 x daily - 7 x weekly - 2 sets - 10 reps - to failure hold  Added 12/18/2021: - Shoulder External Rotation and Scapular Retraction with Resistance  - 1 x daily - 7 x weekly - 3 sets - 10 reps - 3-sec hold   ASSESSMENT:   CLINICAL IMPRESSION: Pt responded well to all interventions today, demonstrating good form and no increase in pain with completed exercises. Of note, the pt reports a therapeutic response to manual techniques today, reporting decrease in pain from 1/10 to 0/10 and demonstrating visually improved shoulder elevation ROM. She will continue to benefit from skilled PT to address her primary impairments and return to her prior level of function with less limitation.    OBJECTIVE IMPAIRMENTS decreased ROM, decreased strength, hypomobility, impaired flexibility, impaired UE functional use, improper body mechanics, postural dysfunction, and pain.    ACTIVITY LIMITATIONS cleaning, community activity, driving, laundry, yard work, and shopping.    PERSONAL FACTORS Age, Time since onset of injury/illness/exacerbation, and 1 comorbidity: HTN  are also affecting patient's functional outcome.          GOALS: Goals reviewed with patient? Yes   SHORT TERM GOALS: Target date: 10/16/2021   Pt will report understanding and adherence to her HEP in order to promote independence in the management of her primary impairments. Baseline: HEP provided at eval 10/16/2021: Pt reports daily HEP adherence Goal status: ACHIEVED       LONG TERM GOALS: Target date: 11/13/2021   Pt will achieve a FOTO score meeting her predictive value in order to demonstrate improved functional ability as it relates to her primary  impairments. Baseline: 52%, predicted 62% in 12 visits 10/16/2021: 53% 11/21/2021: 42% Goal status: PROGRESSING   2.  Pt will report no headaches x3 weeks in order to complete ADLs with less limitation. Baseline: 2-3 debilitating headaches each month 10/16/2021: No headaches since beginning PT Goal status: ACHIEVED   3.  Pt will achieve cervical extension AROM of 60 degrees in order to look up into cabinets with less limitation. Baseline: 40 degrees 10/16/2021: 45 degrees 11/20/2021: 50 degrees with no pain Goal status: PROGRESSING  4.  Pt will achieve BIL global parascapular strength of 4+/5 in order to promote prophylactic measures in mechanical neck pain. Baseline: See MMT chart 10/16/2021: See updated MMT chart Goal status: ACHIEVED  5. *New 11/21/2021* Pt will report average pain of 0-3/10 in order to return to independent workouts with less limitation.  Baseline: 6/10 baseline pain  Goal status: INITIAL         PLAN: PT FREQUENCY: 1x/week   PT DURATION: 8 weeks   PLANNED INTERVENTIONS: Therapeutic exercises, Therapeutic activity, Neuromuscular re-education, Balance training, Gait training, Patient/Family education, Joint manipulation, Joint mobilization, Vestibular training, Dry Needling, Electrical stimulation, Spinal manipulation, Spinal mobilization, Cryotherapy, Moist heat, Taping, Traction, Biofeedback, Ionotophoresis 4mg /ml Dexamethasone, and Manual therapy   PLAN FOR NEXT SESSION: Progress DNF/ parascapular strength, core/ hip strength   Vanessa [place], PT, DPT 12/18/21 9:58 AM

## 2021-12-23 ENCOUNTER — Ambulatory Visit: Payer: Medicare HMO

## 2021-12-23 DIAGNOSIS — M542 Cervicalgia: Secondary | ICD-10-CM

## 2021-12-23 DIAGNOSIS — M5459 Other low back pain: Secondary | ICD-10-CM

## 2021-12-23 DIAGNOSIS — M6281 Muscle weakness (generalized): Secondary | ICD-10-CM

## 2021-12-23 NOTE — Therapy (Addendum)
OUTPATIENT PHYSICAL THERAPY TREATMENT NOTE/ DISCHARGE SUMMARY   Patient Name: Morgan Wolfe MRN: [id number] DOB:[birth year], 66 y.o., female Today's Date: 12/23/2021  PCP: Caren Macadam, MD REFERRING PROVIDER: Susa Day, MD  END OF SESSION:   PT End of Session - 12/23/21 0915     Visit Number 14    Number of Visits 18    Date for PT Re-Evaluation 01/16/22    Authorization Type [employer] MCR    Authorization Time Period FOTO v6,v10, KX mod v15    Progress Note Due on Visit 10    PT Start Time 0916    PT Stop Time 0956    PT Time Calculation (min) 40 min    Activity Tolerance Patient tolerated treatment well    Behavior During Therapy [facility] for tasks assessed/performed                         Past Medical History:  Diagnosis Date   ALLERGIC RHINITIS    Hypertension    PONV (postoperative nausea and vomiting)    Right shoulder pain    Past Surgical History:  Procedure Laterality Date   ANKLE ARTHROSCOPY Left 01/03/2019   Procedure: LEFT ANKLE ARTHROSCOPY AND DEBRIDEMENT;  Surgeon: Newt Minion, MD;  Location: [place];  Service: Orthopedics;  Laterality: Left;   ANKLE SURGERY Left 2000   ELBOW ARTHROSCOPY Right 2019   TOE SURGERY Right 1998   bone spur   VAGINAL HYSTERECTOMY  1980   per Dr. Harrington Challenger for fibroids; no cancer   Patient Active Problem List   Diagnosis Date Noted   Lymphocytosis 11/20/2020   Impingement syndrome of left ankle 01/11/2018   Lateral epicondylitis (tennis elbow) 08/31/2016   Essential hypertension 12/06/2006   Allergic rhinitis 12/06/2006    REFERRING DIAG: M54.2 (ICD-10-CM) - Cervicalgia  THERAPY DIAG:  Cervicalgia  Muscle weakness (generalized)  Other low back pain  PERTINENT HISTORY:  HTN  SUBJECTIVE:  Pt reports she was feeling well until picking a heavy bin this morning, which caused her back pain to increase. She reports she took pain medication following this. She reports a positive  response to treatment last week.   PAIN:  Are you having pain?  Yes: NPRS scale: 5/10 LBP  Pain description: Sharp Aggravating factors: cervical movements, walking/ standing >15 minutes, laying on Rt side, laying supine Relieving factors: Alieve, heating pad   OBJECTIVE: (objective measures completed at initial evaluation unless otherwise dated)   PATIENT SURVEYS:  FOTO (09/30/2021): 52%, projected 62% in 12 visits 10/16/2021: 53% 11/21/2021: 42%   CERVICAL ROM:    Active ROM AROM (deg) 09/18/2021 AROM 10/16/2021 AROM 11/21/2021  Flexion 60 80   Extension 40p! 45p! 50, no pain  Right lateral flexion 40    Left lateral flexion 40    Right rotation 72    Left rotation 68     (Blank rows = not tested)   UE ROM:   Active/Passive ROM Right 09/18/2021 Left 09/18/2021 Right 10/16/2021  Shoulder flexion 135p!/ 170 WNL 160/  180  Shoulder abduction 145p! And audible click/ 704 WNL 888/916 minor pain at end range  Shoulder internal rotation 75 minor p!/ 90p! WNL 72/85  Shoulder external rotation 85/ 105 WNL 82/90   (Blank rows = not tested)   UE MMT:   MMT Right 09/18/2021 Left 09/18/2021 Right 10/16/2021 Left 10/16/2021  Shoulder flexion 5/5 5/5    Shoulder extension 5/5 5/5    Shoulder abduction  5/5 5/5    Shoulder ER 4/5p! 5/5 4+/5   Shoulder IR 5/5 5/5    Latissimus dorsi 3+/5 4/5 5/5 5/5  Mid trap 3/5p! 4/5 5/5 4+/5  Low trap 3/5 3+/5 4+/5 4+/5   (Blank rows = not tested)   FUNCTIONAL TESTS:  11/21/2021: DNF endurance testing: 60 seconds, terminated due to poor form     TODAY'S TREATMENT:   [facility] Adult PT Treatment:                                                DATE: 12/23/2021 Therapeutic Exercise: DKTC stretch x52min Sidelying lumbar open books x10 BIL Knee plank 3x30 seconds Child's pose 3x30 seconds Bridge 3x5 with 10-sec hold  LTR 2x10 Standing Cybex hip abduction with 25# cable 2x10 BIL Standing Cybex hip flexion with 25# cable 2x10 BIL Manual Therapy: Effleurage/ STM  to BIL lumbar paraspinals x8 minutes Neuromuscular re-ed: N/A Therapeutic Activity: N/A Modalities: N/A Self Care: N/A   [facility] Adult PT Treatment:                                                DATE: 12/18/2021 Therapeutic Exercise: Prone swimmers with full can 3x10 BIL Alternating lunge with overhead pull with 3# cables at [employer] machine 3x10 Standing Rt shoulder cross-body adduction stretch 2x102min Standing BIL shoulder scaption with 2# dumbbells with back at wall 3x10 Knee planks 3x30 seconds DKTC stretch x23min  Manual Therapy: Lt sidelying Rt scapular protaction/ flexion contract/relax MET x5 with 20-second hold at end range Lt sidelying Rt mid trap/rhomboids/teres/ supraspinatus STM Neuromuscular re-ed: N/A Therapeutic Activity: N/A Modalities: N/A Self Care: N/A  [facility] Adult PT Treatment:                                                DATE: 12/11/2021 Therapeutic Exercise: Straight-arm reverse crunches 3x8 DKTC stretch 2x26min Side knee plank with hip abduction 2x10 BIL 25# Kettlebell deadlift 3x8 Standing Pallof press with 10# cable 2x10 with 5-sec hold Standing straight-leg trunk extension with 10# cable from floor 2x10 Manual Therapy: N/A Neuromuscular re-ed: N/A Therapeutic Activity: N/A Modalities: N/A Self Care: N/A     PATIENT EDUCATION:  Education details: Pt educated on potential underlying pathophysiology behind her pain presentation, POC, prognosis, and HEP Person educated: Patient Education method: Explanation, Demonstration, and Handouts Education comprehension: verbalized understanding and returned demonstration     HOME EXERCISE PROGRAM: Access Code: [email or web address] URL: [email or web address] Date: 09/18/2021 Prepared by: Vanessa    Exercises - Seated Cervical Sidebending Stretch  - 1 x daily - 7 x weekly - 2-min hold - Standing Shoulder Row with Anchored Resistance  - 1 x daily - 7 x weekly - 3 sets - 10 reps -  3-sec hold - Seated Cervical Retraction  - 1 x daily - 7 x weekly - 3 sets - 10 reps - 5-sec hold - Cervical Extension AROM with Strap  - 1 x daily - 7 x weekly - 10 reps - 10-sec hold  Added 09/30/2021: - Prone Swimmer  - 1 x daily - 7 x weekly - 3  sets - 20 reps  Added 10/16/2021:  - Plank on Knees  - 1 x daily - 7 x weekly - 3 sets - to failure hold - Side Plank on Knees  - 1 x daily - 7 x weekly - 2 sets - 10 reps - to failure hold  Added 12/18/2021: - Shoulder External Rotation and Scapular Retraction with Resistance  - 1 x daily - 7 x weekly - 3 sets - 10 reps - 3-sec hold   ASSESSMENT:   CLINICAL IMPRESSION: Pt responded well to all interventions today, demonstrating good form and no increase in pain with completed exercises. Due to pt presenting with increased pain following lifting a heavy bin this morning, exercises were regressed today to focus on gentle mobility/ core strengthening. She will continue to benefit from skilled PT to address her primary impairments and return to her prior level of function with less limitation.     OBJECTIVE IMPAIRMENTS decreased ROM, decreased strength, hypomobility, impaired flexibility, impaired UE functional use, improper body mechanics, postural dysfunction, and pain.    ACTIVITY LIMITATIONS cleaning, community activity, driving, laundry, yard work, and shopping.    PERSONAL FACTORS Age, Time since onset of injury/illness/exacerbation, and 1 comorbidity: HTN  are also affecting patient's functional outcome.          GOALS: Goals reviewed with patient? Yes   SHORT TERM GOALS: Target date: 10/16/2021   Pt will report understanding and adherence to her HEP in order to promote independence in the management of her primary impairments. Baseline: HEP provided at eval 10/16/2021: Pt reports daily HEP adherence Goal status: ACHIEVED       LONG TERM GOALS: Target date: 11/13/2021   Pt will achieve a FOTO score meeting her predictive value in  order to demonstrate improved functional ability as it relates to her primary impairments. Baseline: 52%, predicted 62% in 12 visits 10/16/2021: 53% 11/21/2021: 42% Goal status: PROGRESSING   2.  Pt will report no headaches x3 weeks in order to complete ADLs with less limitation. Baseline: 2-3 debilitating headaches each month 10/16/2021: No headaches since beginning PT Goal status: ACHIEVED   3.  Pt will achieve cervical extension AROM of 60 degrees in order to look up into cabinets with less limitation. Baseline: 40 degrees 10/16/2021: 45 degrees 11/20/2021: 50 degrees with no pain Goal status: PROGRESSING  4.  Pt will achieve BIL global parascapular strength of 4+/5 in order to promote prophylactic measures in mechanical neck pain. Baseline: See MMT chart 10/16/2021: See updated MMT chart Goal status: ACHIEVED  5. *New 11/21/2021* Pt will report average pain of 0-3/10 in order to return to independent workouts with less limitation.  Baseline: 6/10 baseline pain  Goal status: INITIAL         PLAN: PT FREQUENCY: 1x/week   PT DURATION: 8 weeks   PLANNED INTERVENTIONS: Therapeutic exercises, Therapeutic activity, Neuromuscular re-education, Balance training, Gait training, Patient/Family education, Joint manipulation, Joint mobilization, Vestibular training, Dry Needling, Electrical stimulation, Spinal manipulation, Spinal mobilization, Cryotherapy, Moist heat, Taping, Traction, Biofeedback, Ionotophoresis 4mg /ml Dexamethasone, and Manual therapy   PLAN FOR NEXT SESSION: Progress DNF/ parascapular strength, core/ hip strength   Vanessa [place], PT, DPT 12/23/21 9:58 AM  PHYSICAL THERAPY DISCHARGE SUMMARY  Visits from Start of Care: 14  Current functional level related to goals / functional outcomes: Pt has improved her HA frequency, cervical ROM, and strength   Remaining deficits: Pt continued to exhibit high pain levels and decrease cervical ROM   Education / Equipment: HEP  Patient agrees to discharge. Patient goals were partially met. Patient is being discharged due to not returning since the last visit.  Vanessa [place], PT, DPT 01/28/22 3:27 PM

## 2022-01-06 ENCOUNTER — Other Ambulatory Visit: Payer: Self-pay | Admitting: Podiatry

## 2022-01-12 ENCOUNTER — Other Ambulatory Visit: Payer: Self-pay | Admitting: Podiatry

## 2022-02-11 ENCOUNTER — Other Ambulatory Visit: Payer: Self-pay | Admitting: Podiatry

## 2022-02-11 DIAGNOSIS — M7752 Other enthesopathy of left foot: Secondary | ICD-10-CM

## 2022-02-19 ENCOUNTER — Other Ambulatory Visit: Payer: Self-pay | Admitting: Podiatry

## 2022-03-02 ENCOUNTER — Other Ambulatory Visit: Payer: Self-pay | Admitting: Family Medicine

## 2022-03-02 ENCOUNTER — Ambulatory Visit
Admission: RE | Admit: 2022-03-02 | Discharge: 2022-03-02 | Disposition: A | Discharge status: Home / Self Care | Payer: Medicare HMO | Source: Ambulatory Visit | Attending: Family Medicine | Admitting: Family Medicine

## 2022-03-02 DIAGNOSIS — M25551 Pain in right hip: Secondary | ICD-10-CM

## 2022-03-02 DIAGNOSIS — G8929 Other chronic pain: Secondary | ICD-10-CM

## 2022-03-02 DIAGNOSIS — M7061 Trochanteric bursitis, right hip: Secondary | ICD-10-CM

## 2022-03-05 ENCOUNTER — Encounter (HOSPITAL_COMMUNITY): Payer: Self-pay | Admitting: Emergency Medicine

## 2022-03-05 ENCOUNTER — Emergency Department (HOSPITAL_COMMUNITY): Payer: Medicare HMO

## 2022-03-05 ENCOUNTER — Emergency Department (HOSPITAL_COMMUNITY)
Admission: EM | Admit: 2022-03-05 | Discharge: 2022-03-05 | Discharge status: Against Medical Advice | Payer: Medicare HMO | Attending: Student | Admitting: Student

## 2022-03-05 DIAGNOSIS — R5383 Other fatigue: Secondary | ICD-10-CM | POA: Diagnosis not present

## 2022-03-05 DIAGNOSIS — Z5321 Procedure and treatment not carried out due to patient leaving prior to being seen by health care provider: Secondary | ICD-10-CM | POA: Insufficient documentation

## 2022-03-05 DIAGNOSIS — R200 Anesthesia of skin: Secondary | ICD-10-CM | POA: Diagnosis not present

## 2022-03-05 DIAGNOSIS — M5410 Radiculopathy, site unspecified: Secondary | ICD-10-CM | POA: Insufficient documentation

## 2022-03-05 DIAGNOSIS — R531 Weakness: Secondary | ICD-10-CM | POA: Diagnosis not present

## 2022-03-05 LAB — CBC
HCT: 37.6 % (ref 36.0–46.0)
Hemoglobin: 12.8 g/dL (ref 12.0–15.0)
MCH: 28.2 pg (ref 26.0–34.0)
MCHC: 34 g/dL (ref 30.0–36.0)
MCV: 82.8 fL (ref 80.0–100.0)
Platelets: 386 10*3/uL (ref 150–400)
RBC: 4.54 MIL/uL (ref 3.87–5.11)
RDW: 13 % (ref 11.5–15.5)
WBC: 17.2 10*3/uL — ABNORMAL HIGH (ref 4.0–10.5)
nRBC: 0 % (ref 0.0–0.2)

## 2022-03-05 LAB — BASIC METABOLIC PANEL
Anion gap: 11 (ref 5–15)
BUN: 17 mg/dL (ref 8–23)
CO2: 30 mmol/L (ref 22–32)
Calcium: 9.3 mg/dL (ref 8.9–10.3)
Chloride: 98 mmol/L (ref 98–111)
Creatinine, Ser: 1.18 mg/dL — ABNORMAL HIGH (ref 0.44–1.00)
GFR, Estimated: 52 mL/min — ABNORMAL LOW (ref >60)
Glucose, Bld: 117 mg/dL — ABNORMAL HIGH (ref 70–99)
Potassium: 3.6 mmol/L (ref 3.5–5.1)
Sodium: 139 mmol/L (ref 135–145)

## 2022-03-05 LAB — URINALYSIS, ROUTINE W REFLEX MICROSCOPIC
Bilirubin Urine: NEGATIVE
Glucose, UA: NEGATIVE mg/dL
Ketones, ur: NEGATIVE mg/dL
Leukocytes,Ua: NEGATIVE
Nitrite: NEGATIVE
Protein, ur: NEGATIVE mg/dL
Specific Gravity, Urine: 1.015 (ref 1.005–1.030)
pH: 6 (ref 5.0–8.0)

## 2022-03-05 NOTE — ED Triage Notes (Signed)
Pt reports left side pain since last month, hip to foot. Denies any injury.

## 2022-03-05 NOTE — ED Notes (Signed)
Pt stated that she would call her PCP in the morning. Pt was seen leaving the ED.

## 2022-03-05 NOTE — ED Provider Triage Note (Addendum)
Emergency Medicine Provider Triage Evaluation Note  Morgan Wolfe , a 66 y.o. female  was evaluated in triage.  Pt complains of left-sided radicular pain.  Patient states she had a recent MRI which was inconclusive.  She states she called her neurosurgeon today, Dr. Maxie Better, who recommend she come to the emergency department for CT scan.  Patient denies any new injury or trauma.  Complains of numbness in the left foot, pain that radiates down the lateral portion of the left leg from the buttocks down to the foot.  She denies frank back pain at this time.  Denies saddle anesthesia, urinary incontinence, urinary retention, fecal incontinence  Patient added that she has been feeling fatigued and weak for the past week or 2.  She denies any syncope, chest pain, shortness of breath.  Review of Systems  Positive: As above Negative: As above  Physical Exam  BP (!) 171/101 (BP Location: Right Arm)   Pulse 92   Temp 98.4 F (36.9 C) (Oral)   Resp 16   Ht 5' 7.25" (1.708 m)   Wt 95.5 kg   SpO2 99%   BMI 32.73 kg/m  Gen:   Awake, no distress   Resp:  Normal effort  MSK:   Moves extremities without difficulty  Other:    Medical Decision Making  Medically screening exam initiated at 6:16 PM.  Appropriate orders placed.  Morgan Wolfe was informed that the remainder of the evaluation will be completed by another provider, this initial triage assessment does not replace that evaluation, and the importance of remaining in the ED until their evaluation is complete.     Dorothyann Peng, PA-C 03/05/22 1817    Dorothyann Peng, PA-C 03/05/22 1818

## 2022-03-10 ENCOUNTER — Encounter: Payer: Self-pay | Admitting: Internal Medicine

## 2022-03-19 ENCOUNTER — Other Ambulatory Visit: Payer: Self-pay | Admitting: Podiatry

## 2022-03-27 ENCOUNTER — Encounter: Payer: Self-pay | Admitting: Internal Medicine

## 2022-05-05 ENCOUNTER — Ambulatory Visit: Payer: Medicare HMO | Admitting: Internal Medicine

## 2022-06-08 ENCOUNTER — Encounter: Payer: Self-pay | Admitting: Internal Medicine

## 2022-06-08 ENCOUNTER — Ambulatory Visit: Payer: Medicare HMO | Admitting: Internal Medicine

## 2022-06-08 VITALS — HR 74 | Ht 66.0 in | Wt 197.0 lb

## 2022-06-08 DIAGNOSIS — R1310 Dysphagia, unspecified: Secondary | ICD-10-CM | POA: Diagnosis not present

## 2022-06-08 NOTE — Progress Notes (Signed)
Chief Complaint: Dysphagia  HPI : 67 year old female with history of allergic rhinitis, reactive lymphocytosis, and HTN presents with dysphagia  Endorses dysphagia that has been present for several years but has been getting worse over the last year. Endorses dysphagia to solids, liquids, and pills. The dysphagia occurs once a month on average. She feels it get stuck in the bottom of her throat, and she has to wait for the pill to go down. Sometimes she does have to vomit the food/pill back up. Denies prior EGD. Denies chest burning or regurgitation. Denies odynophagia. Denies ab pain. Denies melena or hematochezia. Denies constipation or diarrhea. Last colonoscopy was in 2013 with some benign polyps. She just did a Cologuard in 2023 with her PCP that was negative. Denies family history of GI cancers. Mother had trouble swallowing as well but unclear why. She used to be on omeprazole but no longer takes this medication.  Past Medical History:  Diagnosis Date   ALLERGIC RHINITIS    Hypertension    PONV (postoperative nausea and vomiting)    Right shoulder pain    Past Surgical History:  Procedure Laterality Date   ANKLE ARTHROSCOPY Left 01/03/2019   Procedure: LEFT ANKLE ARTHROSCOPY AND DEBRIDEMENT;  Surgeon: Newt Minion, MD;  Location: [facility];  Service: Orthopedics;  Laterality: Left;   ANKLE SURGERY Left 2000   ELBOW ARTHROSCOPY Right 2019   TOE SURGERY Right 1998   bone spur   VAGINAL HYSTERECTOMY  1980   per Dr. Harrington Challenger for fibroids; no cancer   Family History  Problem Relation Age of Onset   Glaucoma Mother    Heart failure Mother    Kidney failure Mother    Pancreatic cancer Father    Breast cancer Sister    Brain cancer Sister    Heart attack Brother 77   Stroke Brother 55   Coronary artery disease Other        1st degress relatives <50   Hypertension Other    Hyperlipidemia Other    Colon cancer Neg Hx    Stomach cancer Neg Hx    Social History    Tobacco Use   Smoking status: Never   Smokeless tobacco: Never  Vaping Use   Vaping Use: Never used  Substance Use Topics   Alcohol use: No   Drug use: No   Current Outpatient Medications  Medication Sig Dispense Refill   allopurinol (ZYLOPRIM) 100 MG tablet Take 1 tablet (100 mg total) by mouth daily. (Patient taking differently: Take 100 mg by mouth daily as needed.) 30 tablet 1   estradiol (ESTRACE VAGINAL) 0.1 MG/GM vaginal cream Apply one applicator per vagina daily for 2 weeks and then one applicator per vagina three times weekly 42.5 g 11   fluticasone (FLONASE) 50 MCG/ACT nasal spray SHAKE LIQUID AND USE 2 SPRAYS IN EACH NOSTRIL DAILY (Patient taking differently: 2 sprays daily as needed. SHAKE LIQUID AND USE 2 SPRAYS IN EACH NOSTRIL DAILY) 16 g 5   loratadine (CLARITIN) 10 MG tablet Take 1 tablet (10 mg total) by mouth daily. (Patient taking differently: Take 10 mg by mouth daily as needed for allergies.) 90 tablet 1   meclizine (ANTIVERT) 25 MG tablet Take 1 tablet (25 mg total) by mouth 3 (three) times daily as needed for dizziness. 30 tablet 4   meloxicam (MOBIC) 7.5 MG tablet Take 1 tablet (7.5 mg total) by mouth daily as needed for pain. 30 tablet 0   omeprazole (PRILOSEC)  40 MG capsule Take 40 mg by mouth daily.     triamterene-hydrochlorothiazide (DYAZIDE) 37.5-25 MG capsule Take 1 each (1 capsule total) by mouth daily. 90 capsule 1   No current facility-administered medications for this visit.   Allergies  Allergen Reactions   Augmentin [Amoxicillin-Pot Clavulanate] Nausea And Vomiting    Diarrhea    Sulfonamide Derivatives Itching    Review of Systems: All systems reviewed and negative except where noted in HPI.   Physical Exam: Pulse 74   Ht '5\' 6"'$  (1.676 m)   Wt 197 lb (89.4 kg)   BMI 31.80 kg/m  Constitutional: Pleasant,well-developed, female in no acute distress. HEENT: Normocephalic and atraumatic. Conjunctivae are normal. No scleral  icterus. Cardiovascular: Normal rate, regular rhythm.  Pulmonary/chest: Effort normal and breath sounds normal. No wheezing, rales or rhonchi. Abdominal: Soft, nondistended, nontender. Bowel sounds active throughout. There are no masses palpable. No hepatomegaly. Extremities: No edema Neurological: Alert and oriented to person place and time. Skin: Skin is warm and dry. No rashes noted. Psychiatric: Normal mood and affect. Behavior is normal.  Labs 05/2021: CBC unremarkable. CMP with mildly low K of 3.3. LDH nml.   Labs 02/2022: CBC with elevated WBC of 17.2. BMP with mildly elevated Cr of 1.18.   CT A/P w/contrast 12/06/19: IMPRESSION: No acute abnormality seen in the abdomen or pelvis.  Colonoscopy 02/12/12:  Path: Surgical [P], descending and sigmoid, polyp - HYPERPLASTIC POLYP AND BENIGN LYMPHOID POLYP. NO ADENOMATOUS CHANGE OR MALIGNANCY.  ASSESSMENT AND PLAN: Dysphagia Patient presents with dysphagia to solids and liquids over the last several years that has worsened over the last year. Unclear etiology at this time. Differential includes esophageal stricture, malignancy, eosinophilic esophagitis, motility disorder, and/or reflux esophagitis. Will plan for EGD for further evaluation. - EGD [facility]  Christia Reading, MD

## 2022-06-08 NOTE — Patient Instructions (Signed)
If you are age 65 or younger, your body mass index should be between 19-25. Your Body mass index is 31.8 kg/m. If this is out of the aformentioned range listed, please consider follow up with your Primary Care Provider.  ________________________________________________________  The [facility] providers would like to encourage you to use [facility] to communicate with providers for non-urgent requests or questions.  Due to long hold times on the telephone, sending your provider a message by [facility] may be a faster and more efficient way to get a response.  Please allow 48 business hours for a response.  Please remember that this is for non-urgent requests.  _______________________________________________________  Dennis Bast have been scheduled for an endoscopy. Please follow written instructions given to you at your visit today. If you use inhalers (even only as needed), please bring them with you on the day of your procedure.  Due to recent changes in healthcare laws, you may see the results of your imaging and laboratory studies on MyChart before your provider has had a chance to review them.  We understand that in some cases there may be results that are confusing or concerning to you. Not all laboratory results come back in the same time frame and the provider may be waiting for multiple results in order to interpret others.  Please give [place] 48 hours in order for your provider to thoroughly review all the results before contacting the office for clarification of your results.   Thank you for entrusting me with your care and choosing [facility].  Dr Lorenso Courier

## 2022-07-01 ENCOUNTER — Encounter: Payer: Medicare HMO | Admitting: Internal Medicine

## 2022-07-10 ENCOUNTER — Ambulatory Visit (INDEPENDENT_AMBULATORY_CARE_PROVIDER_SITE_OTHER): Payer: Medicare HMO

## 2022-07-10 VITALS — Ht 66.0 in | Wt 193.0 lb

## 2022-07-10 DIAGNOSIS — Z0000 Encounter for general adult medical examination without abnormal findings: Secondary | ICD-10-CM | POA: Diagnosis not present

## 2022-07-10 NOTE — Progress Notes (Signed)
Subjective:   Morgan Wolfe is a 67 y.o. female who presents for Medicare Annual (Subsequent) preventive examination.  Review of Systems    Virtual Visit via Telephone Note  I connected with  [name] on 07/10/22 at 11:00 AM EST by telephone and verified that I am speaking with the correct person using two identifiers.  Location: Patient: Home Provider: Office Persons participating in the virtual visit: patient/Nurse Health Advisor   I discussed the limitations, risks, security and privacy concerns of performing an evaluation and management service by telephone and the availability of in person appointments. The patient expressed understanding and agreed to proceed.  Interactive audio and video telecommunications were attempted between this nurse and patient, however failed, due to patient having technical difficulties OR patient did not have access to video capability.  We continued and completed visit with audio only.  Some vital signs may be absent or patient reported.   [name], LPN  Cardiac Risk Factors include: advanced age (>50mn, >>68women);hypertension     Objective:    Today's Vitals   07/10/22 1122  Weight: 193 lb (87.5 kg)  Height: '5\' 6"'$  (1.676 m)   Body mass index is 31.15 kg/m.     07/10/2022   11:29 AM 09/18/2021    3:36 PM 01/03/2019    7:48 AM  Advanced Directives  Does Patient Have a Medical Advance Directive? No No No  Would patient like information on creating a medical advance directive? No - Patient declined Yes (MAU/Ambulatory/Procedural Areas - Information given) No - Patient declined    Current Medications (verified) Outpatient Encounter Medications as of 07/10/2022  Medication Sig   allopurinol (ZYLOPRIM) 100 MG tablet Take 1 tablet (100 mg total) by mouth daily. (Patient taking differently: Take 100 mg by mouth daily as needed.)   estradiol (ESTRACE VAGINAL) 0.1 MG/GM vaginal cream Apply one applicator per vagina daily for 2  weeks and then one applicator per vagina three times weekly   fluticasone (FLONASE) 50 MCG/ACT nasal spray SHAKE LIQUID AND USE 2 SPRAYS IN EACH NOSTRIL DAILY (Patient taking differently: 2 sprays daily as needed. SHAKE LIQUID AND USE 2 SPRAYS IN EACH NOSTRIL DAILY)   loratadine (CLARITIN) 10 MG tablet Take 1 tablet (10 mg total) by mouth daily. (Patient taking differently: Take 10 mg by mouth daily as needed for allergies.)   meclizine (ANTIVERT) 25 MG tablet Take 1 tablet (25 mg total) by mouth 3 (three) times daily as needed for dizziness.   meloxicam (MOBIC) 7.5 MG tablet Take 1 tablet (7.5 mg total) by mouth daily as needed for pain.   omeprazole (PRILOSEC) 40 MG capsule Take 40 mg by mouth daily.   triamterene-hydrochlorothiazide (DYAZIDE) 37.5-25 MG capsule Take 1 each (1 capsule total) by mouth daily.   No facility-administered encounter medications on file as of 07/10/2022.    Allergies (verified) Augmentin [amoxicillin-pot clavulanate] and Sulfonamide derivatives   History: Past Medical History:  Diagnosis Date   ALLERGIC RHINITIS    Hypertension    PONV (postoperative nausea and vomiting)    Right shoulder pain    Past Surgical History:  Procedure Laterality Date   ANKLE ARTHROSCOPY Left 01/03/2019   Procedure: LEFT ANKLE ARTHROSCOPY AND DEBRIDEMENT;  Surgeon: DNewt Minion MD;  Location: [facility]  Service: Orthopedics;  Laterality: Left;   ANKLE SURGERY Left 2000   ELBOW ARTHROSCOPY Right 2019   TOE SURGERY Right 1998   bone spur   VAGINAL HYSTERECTOMY  1980  per Dr. Harrington Challenger for fibroids; no cancer   Family History  Problem Relation Age of Onset   Glaucoma Mother    Heart failure Mother    Kidney failure Mother    Pancreatic cancer Father    Breast cancer Sister    Brain cancer Sister    Heart attack Brother 64   Stroke Brother 62   Coronary artery disease Other        1st degress relatives <50   Hypertension Other    Hyperlipidemia Other     Colon cancer Neg Hx    Stomach cancer Neg Hx    Social History   Socioeconomic History   Marital status: Single    Spouse name: Not on file   Number of children: 0   Years of education: Not on file   Highest education level: Some college, no degree  Occupational History   Occupation: retired  Tobacco Use   Smoking status: Never   Smokeless tobacco: Never  Vaping Use   Vaping Use: Never used  Substance and Sexual Activity   Alcohol use: No   Drug use: No   Sexual activity: Not on file  Other Topics Concern   Not on file  Social History Narrative   Not on file   Social Determinants of Health   Financial Resource Strain: Low Risk  (07/10/2022)   Overall Financial Resource Strain (CARDIA)    Difficulty of Paying Living Expenses: Not hard at all  Food Insecurity: No Food Insecurity (07/10/2022)   Hunger Vital Sign    Worried About Running Out of Food in the Last Year: Never true    Ran Out of Food in the Last Year: Never true  Transportation Needs: No Transportation Needs (07/10/2022)   PRAPARE - Hydrologist (Medical): No    Lack of Transportation (Non-Medical): No  Physical Activity: Sufficiently Active (07/10/2022)   Exercise Vital Sign    Days of Exercise per Week: 3 days    Minutes of Exercise per Session: 60 min  Stress: No Stress Concern Present (07/10/2022)   [place]    Feeling of Stress : Not at all  Social Connections: Moderately Integrated (07/10/2022)   Social Connection and Isolation Panel [NHANES]    Frequency of Communication with Friends and Family: More than three times a week    Frequency of Social Gatherings with Friends and Family: More than three times a week    Attends Religious Services: More than 4 times per year    Active Member of [employer] or Organizations: Yes    Attends Music therapist: More than 4 times per year    Marital Status: Never  married    Tobacco Counseling Counseling given: Not Answered   Clinical Intake:  Pre-visit preparation completed: Yes  Pain : No/denies pain     BMI - recorded: 31.15 Nutritional Risks: None Diabetes: No  How often do you need to have someone help you when you read instructions, pamphlets, or other written materials from your doctor or pharmacy?: 1 - Never  Diabetic?  No  Interpreter Needed?: No  Information entered by :: Rolene Arbour LPN   Activities of Daily Living    07/10/2022   11:28 AM 07/06/2022    8:37 AM  In your present state of health, do you have any difficulty performing the following activities:  Hearing? 0 0  Vision? 0 0  Difficulty concentrating or making decisions?  0 0  Walking or climbing stairs? 0 1  Dressing or bathing? 0 0  Doing errands, shopping? 0 0  Preparing Food and eating ? N N  Using the Toilet? N N  In the past six months, have you accidently leaked urine? N N  Do you have problems with loss of bowel control? N N  Managing your Medications? N N  Managing your Finances? N N  Housekeeping or managing your Housekeeping? N N    Patient Care Team: Eulas Post, MD as PCP - General (Family Medicine)  Indicate any recent Medical Services you may have received from other than [facility] providers in the past year (date may be approximate).     Assessment:   This is a routine wellness examination for [place].  Hearing/Vision screen Hearing Screening - Comments:: Denies hearing difficulties   Vision Screening - Comments:: - up to date with routine eye exams with  Dr Gershon Crane  Dietary issues and exercise activities discussed: Exercise limited by: None identified   Goals Addressed               This Visit's Progress     No current goals (pt-stated)         Depression Screen    07/10/2022   11:27 AM 07/15/2021    3:49 PM 04/30/2021    9:05 AM 11/22/2019   11:04 AM 08/17/2018    2:34 PM 01/10/2018    3:03 PM 02/06/2015    8:45  AM  PHQ 2/9 Scores  PHQ - 2 Score 0 3 0 0 0 0 0  PHQ- 9 Score  11 3        Fall Risk    07/10/2022   11:28 AM 07/06/2022    8:37 AM 01/10/2018    3:03 PM  Fall Risk   Falls in the past year? 0 0 No  Number falls in past yr: 0 0   Injury with Fall? 0 0   Risk for fall due to : No Fall Risks    Follow up Falls prevention discussed      [place]:  Any stairs in or around the home? Yes  If so, are there any without handrails? No  Home free of loose throw rugs in walkways, pet beds, electrical cords, etc? Yes  Adequate lighting in your home to reduce risk of falls? Yes   ASSISTIVE DEVICES UTILIZED TO PREVENT FALLS:  Life alert? No  Use of a cane, walker or w/c? No  Grab bars in the bathroom? No  Shower chair or bench in shower? No  Elevated toilet seat or a handicapped toilet? No   TIMED UP AND GO:  Was the test performed? No . Audio Visit   Cognitive Function:        07/10/2022   11:29 AM  6CIT Screen  What Year? 0 points  What month? 0 points  What time? 0 points  Count back from 20 0 points  Months in reverse 0 points  Repeat phrase 0 points  Total Score 0 points    Immunizations Immunization History  Administered Date(s) Administered   Influenza Split 03/19/2011, 02/15/2013   Influenza,inj,Quad PF,6+ Mos 03/03/2014, 02/19/2016   Influenza-Unspecified 02/18/2021   Janssen (J&J) SARS-COV-2 Vaccination 07/17/2019   Tdap 01/26/2011    TDAP status: Due, Education has been provided regarding the importance of this vaccine. Advised may receive this vaccine at local pharmacy or Health Dept. Aware to provide a copy  of the vaccination record if obtained from local pharmacy or Health Dept. Verbalized acceptance and understanding.  Flu Vaccine status: Up to date    Covid-19 vaccine status: Completed vaccines  Qualifies for Shingles Vaccine? Yes   Zostavax completed No   Shingrix Completed?: No.    Education has been provided  regarding the importance of this vaccine. Patient has been advised to call insurance company to determine out of pocket expense if they have not yet received this vaccine. Advised may also receive vaccine at local pharmacy or Health Dept. Verbalized acceptance and understanding.  Screening Tests Health Maintenance  Topic Date Due   DTaP/Tdap/Td (2 - Td or Tdap) 01/25/2021   COVID-19 Vaccine (2 - 2023-24 season) 07/26/2022 (Originally 01/16/2022)   INFLUENZA VACCINE  08/16/2022 (Originally 12/16/2021)   Zoster Vaccines- Shingrix (1 of 2) 10/08/2022 (Originally 01/06/2008)   COLONOSCOPY (Pts 45-87yr Insurance coverage will need to be confirmed)  07/11/2023 (Originally 02/11/2022)   MAMMOGRAM  07/09/2023   Medicare Annual Wellness (AWV)  07/11/2023   Hepatitis C Screening  Completed   HIV Screening  Completed   HPV VACCINES  Aged Out   PAP SMEAR-Modifier  Discontinued    Health Maintenance  Health Maintenance Due  Topic Date Due   DTaP/Tdap/Td (2 - Td or Tdap) 01/25/2021    Colorectal cancer screening: Referral to GI placed Patient deferred. Pt aware the office will call re: appt.  Mammogram status: Completed 07/08/21. Repeat every year    Lung Cancer Screening: (Low Dose CT Chest recommended if Age 653-80years, 30 pack-year currently smoking OR have quit w/in 15years.) does not qualify.     Additional Screening:  Hepatitis C Screening: does qualify; Completed 01/10/18  Vision Screening: Recommended annual ophthalmology exams for early detection of glaucoma and other disorders of the eye. Is the patient up to date with their annual eye exam?  Yes  Who is the provider or what is the name of the office in which the patient attends annual eye exams? Dr SGershon CraneIf pt is not established with a provider, would they like to be referred to a provider to establish care? No .   Dental Screening: Recommended annual dental exams for proper oral hygiene  Community Resource Referral / Chronic  Care Management:  CRR required this visit?  No   CCM required this visit?  No      Plan:     I have personally reviewed and noted the following in the patient's chart:   Medical and social history Use of alcohol, tobacco or illicit drugs  Current medications and supplements including opioid prescriptions. Patient is not currently taking opioid prescriptions. Functional ability and status Nutritional status Physical activity Advanced directives List of other physicians Hospitalizations, surgeries, and ER visits in previous 12 months Vitals Screenings to include cognitive, depression, and falls Referrals and appointments  In addition, I have reviewed and discussed with patient certain preventive protocols, quality metrics, and best practice recommendations. A written personalized care plan for preventive services as well as general preventive health recommendations were provided to patient.     BCriselda Peaches LPN   [id number]  Nurse Notes: None

## 2022-07-10 NOTE — Patient Instructions (Addendum)
Morgan Wolfe , Thank you for taking time to come for your Medicare Wellness Visit. I appreciate your ongoing commitment to your health goals. Please review the following plan we discussed and let me know if I can assist you in the future.   These are the goals we discussed:  Goals       No current goals (pt-stated)        This is a list of the screening recommended for you and due dates:  Health Maintenance  Topic Date Due   DTaP/Tdap/Td vaccine (2 - Td or Tdap) 01/25/2021   COVID-19 Vaccine (2 - 2023-24 season) 07/26/2022*   Flu Shot  08/16/2022*   Zoster (Shingles) Vaccine (1 of 2) 10/08/2022*   Colon Cancer Screening  07/11/2023*   Mammogram  07/09/2023   Medicare Annual Wellness Visit  07/11/2023   Hepatitis C Screening: USPSTF Recommendation to screen - Ages 18-79 yo.  Completed   HIV Screening  Completed   HPV Vaccine  Aged Out   Pap Smear  Discontinued  *Topic was postponed. The date shown is not the original due date.    Advanced directives: Advance directive discussed with you today. Even though you declined this today, please call our office should you change your mind, and we can give you the proper paperwork for you to fill out.   Conditions/risks identified: None  Next appointment: Follow up in one year for your annual wellness visit.    Preventive Care 40-64 Years, Female Preventive care refers to lifestyle choices and visits with your health care provider that can promote health and wellness. What does preventive care include? A yearly physical exam. This is also called an annual well check. Dental exams once or twice a year. Routine eye exams. Ask your health care provider how often you should have your eyes checked. Personal lifestyle choices, including: Daily care of your teeth and gums. Regular physical activity. Eating a healthy diet. Avoiding tobacco and drug use. Limiting alcohol use. Practicing safe sex. Taking low-dose aspirin daily starting at age  79. Taking vitamin and mineral supplements as recommended by your health care provider. What happens during an annual well check? The services and screenings done by your health care provider during your annual well check will depend on your age, overall health, lifestyle risk factors, and family history of disease. Counseling  Your health care provider may ask you questions about your: Alcohol use. Tobacco use. Drug use. Emotional well-being. Home and relationship well-being. Sexual activity. Eating habits. Work and work Statistician. Method of birth control. Menstrual cycle. Pregnancy history. Screening  You may have the following tests or measurements: Height, weight, and BMI. Blood pressure. Lipid and cholesterol levels. These may be checked every 5 years, or more frequently if you are over 66 years old. Skin check. Lung cancer screening. You may have this screening every year starting at age 61 if you have a 30-pack-year history of smoking and currently smoke or have quit within the past 15 years. Fecal occult blood test (FOBT) of the stool. You may have this test every year starting at age 19. Flexible sigmoidoscopy or colonoscopy. You may have a sigmoidoscopy every 5 years or a colonoscopy every 10 years starting at age 40. Hepatitis C blood test. Hepatitis B blood test. Sexually transmitted disease (STD) testing. Diabetes screening. This is done by checking your blood sugar (glucose) after you have not eaten for a while (fasting). You may have this done every 1-3 years. Mammogram. This may be  done every 1-2 years. Talk to your health care provider about when you should start having regular mammograms. This may depend on whether you have a family history of breast cancer. BRCA-related cancer screening. This may be done if you have a family history of breast, ovarian, tubal, or peritoneal cancers. Pelvic exam and Pap test. This may be done every 3 years starting at age 56.  Starting at age 27, this may be done every 5 years if you have a Pap test in combination with an HPV test. Bone density scan. This is done to screen for osteoporosis. You may have this scan if you are at high risk for osteoporosis. Discuss your test results, treatment options, and if necessary, the need for more tests with your health care provider. Vaccines  Your health care provider may recommend certain vaccines, such as: Influenza vaccine. This is recommended every year. Tetanus, diphtheria, and acellular pertussis (Tdap, Td) vaccine. You may need a Td booster every 10 years. Zoster vaccine. You may need this after age 74. Pneumococcal 13-valent conjugate (PCV13) vaccine. You may need this if you have certain conditions and were not previously vaccinated. Pneumococcal polysaccharide (PPSV23) vaccine. You may need one or two doses if you smoke cigarettes or if you have certain conditions. Talk to your health care provider about which screenings and vaccines you need and how often you need them. This information is not intended to replace advice given to you by your health care provider. Make sure you discuss any questions you have with your health care provider. Document Released: 05/31/2015 Document Revised: 01/22/2016 Document Reviewed: 03/05/2015 Elsevier Interactive Patient Education  2017 [employer] Prevention in the Home Falls can cause injuries. They can happen to people of all ages. There are many things you can do to make your home safe and to help prevent falls. What can I do on the outside of my home? Regularly fix the edges of walkways and driveways and fix any cracks. Remove anything that might make you trip as you walk through a door, such as a raised step or threshold. Trim any bushes or trees on the path to your home. Use bright outdoor lighting. Clear any walking paths of anything that might make someone trip, such as rocks or tools. Regularly check to see if  handrails are loose or broken. Make sure that both sides of any steps have handrails. Any raised decks and porches should have guardrails on the edges. Have any leaves, snow, or ice cleared regularly. Use sand or salt on walking paths during winter. Clean up any spills in your garage right away. This includes oil or grease spills. What can I do in the bathroom? Use night lights. Install grab bars by the toilet and in the tub and shower. Do not use towel bars as grab bars. Use non-skid mats or decals in the tub or shower. If you need to sit down in the shower, use a plastic, non-slip stool. Keep the floor dry. Clean up any water that spills on the floor as soon as it happens. Remove soap buildup in the tub or shower regularly. Attach bath mats securely with double-sided non-slip rug tape. Do not have throw rugs and other things on the floor that can make you trip. What can I do in the bedroom? Use night lights. Make sure that you have a light by your bed that is easy to reach. Do not use any sheets or blankets that are too big for  your bed. They should not hang down onto the floor. Have a firm chair that has side arms. You can use this for support while you get dressed. Do not have throw rugs and other things on the floor that can make you trip. What can I do in the kitchen? Clean up any spills right away. Avoid walking on wet floors. Keep items that you use a lot in easy-to-reach places. If you need to reach something above you, use a strong step stool that has a grab bar. Keep electrical cords out of the way. Do not use floor polish or wax that makes floors slippery. If you must use wax, use non-skid floor wax. Do not have throw rugs and other things on the floor that can make you trip. What can I do with my stairs? Do not leave any items on the stairs. Make sure that there are handrails on both sides of the stairs and use them. Fix handrails that are broken or loose. Make sure that  handrails are as long as the stairways. Check any carpeting to make sure that it is firmly attached to the stairs. Fix any carpet that is loose or worn. Avoid having throw rugs at the top or bottom of the stairs. If you do have throw rugs, attach them to the floor with carpet tape. Make sure that you have a light switch at the top of the stairs and the bottom of the stairs. If you do not have them, ask someone to add them for you. What else can I do to help prevent falls? Wear shoes that: Do not have high heels. Have rubber bottoms. Are comfortable and fit you well. Are closed at the toe. Do not wear sandals. If you use a stepladder: Make sure that it is fully opened. Do not climb a closed stepladder. Make sure that both sides of the stepladder are locked into place. Ask someone to hold it for you, if possible. Clearly mark and make sure that you can see: Any grab bars or handrails. First and last steps. Where the edge of each step is. Use tools that help you move around (mobility aids) if they are needed. These include: Canes. Walkers. Scooters. Crutches. Turn on the lights when you go into a dark area. Replace any light bulbs as soon as they burn out. Set up your furniture so you have a clear path. Avoid moving your furniture around. If any of your floors are uneven, fix them. If there are any pets around you, be aware of where they are. Review your medicines with your doctor. Some medicines can make you feel dizzy. This can increase your chance of falling. Ask your doctor what other things that you can do to help prevent falls. This information is not intended to replace advice given to you by your health care provider. Make sure you discuss any questions you have with your health care provider. Document Released: 02/28/2009 Document Revised: 10/10/2015 Document Reviewed: 06/08/2014 Elsevier Interactive Patient Education  2017 [employer].

## 2022-07-31 ENCOUNTER — Encounter: Payer: Medicare HMO | Admitting: Internal Medicine

## 2022-08-12 ENCOUNTER — Encounter: Payer: Self-pay | Admitting: Internal Medicine

## 2022-08-12 ENCOUNTER — Ambulatory Visit (AMBULATORY_SURGERY_CENTER): Payer: Medicare HMO | Admitting: Internal Medicine

## 2022-08-12 ENCOUNTER — Telehealth: Payer: Self-pay

## 2022-08-12 ENCOUNTER — Other Ambulatory Visit: Payer: Self-pay

## 2022-08-12 VITALS — BP 116/78 | HR 84 | Temp 98.7°F | Resp 13 | Ht 66.0 in | Wt 197.0 lb

## 2022-08-12 DIAGNOSIS — R1310 Dysphagia, unspecified: Secondary | ICD-10-CM

## 2022-08-12 DIAGNOSIS — K222 Esophageal obstruction: Secondary | ICD-10-CM | POA: Diagnosis not present

## 2022-08-12 DIAGNOSIS — K219 Gastro-esophageal reflux disease without esophagitis: Secondary | ICD-10-CM

## 2022-08-12 DIAGNOSIS — K317 Polyp of stomach and duodenum: Secondary | ICD-10-CM | POA: Diagnosis not present

## 2022-08-12 NOTE — Op Note (Signed)
[facility] Patient Name: Morgan Wolfe Procedure Date: 08/12/2022 11:49 AM MRN: [id number] Endoscopist: Adline Mango [place] , , [id number] Age: 67 Referring MD:  Date of Birth: 01/22/1956 Gender: Female Account #: [id number] Procedure:                Upper GI endoscopy Indications:              Dysphagia Medicines:                Monitored Anesthesia Care Procedure:                Pre-Anesthesia Assessment:                           - Prior to the procedure, a History and Physical                            was performed, and patient medications and                            allergies were reviewed. The patient's tolerance of                            previous anesthesia was also reviewed. The risks                            and benefits of the procedure and the sedation                            options and risks were discussed with the patient.                            All questions were answered, and informed consent                            was obtained. Prior Anticoagulants: The patient has                            taken no anticoagulant or antiplatelet agents. ASA                            Grade Assessment: II - A patient with mild systemic                            disease. After reviewing the risks and benefits,                            the patient was deemed in satisfactory condition to                            undergo the procedure.                           After obtaining informed consent, the endoscope was  passed under direct vision. Throughout the                            procedure, the patient's blood pressure, pulse, and                            oxygen saturations were monitored continuously. The                            GIF [id number] was introduced through the                            mouth, and advanced to the second part of duodenum.                            The upper GI endoscopy was accomplished  without                            difficulty. The patient tolerated the procedure                            well. Scope In: Scope Out: Findings:                 A small area of extrinsic compression was found in                            the proximal esophagus.                           Biopsies were taken with a cold forceps in the                            proximal esophagus and in the distal esophagus for                            histology.                           One benign-appearing, intrinsic moderate                            (circumferential scarring or stenosis; an endoscope                            may pass) stenosis was found at the                            gastroesophageal junction. This stenosis measured                            less than one cm (in length). The stenosis was                            traversed. A TTS dilator was passed through the  scope. Dilation with an 18-19-20 mm x 8 cm CRE                            balloon dilator was performed to 20 mm. The                            dilation site was examined and showed mild mucosal                            disruption.                           The entire examined stomach was normal.                           Multiple 3 to 7 mm sessile polyps were found in the                            second portion of the duodenum. Biopsies were taken                            with a cold forceps for histology. Complications:            No immediate complications. Estimated Blood Loss:     Estimated blood loss was minimal. Impression:               - Extrinsic compression in the proximal esophagus.                           - Benign-appearing esophageal stenosis. Dilated.                           - Normal stomach.                           - Multiple duodenal polyps. Biopsied.                           - Biopsies were taken with a cold forceps for                            histology  in the proximal esophagus and in the                            distal esophagus. Recommendation:           - Discharge patient to home (with escort).                           - Await pathology results.                           - Plan for chest CT to rule out source of external                            compression.                           -  The findings and recommendations were discussed                            with the patient.                           - Return to GI clinic in 6 weeks. Dr Georgian Co "[name]" Lorenso Courier,  08/12/2022 12:17:00 PM

## 2022-08-12 NOTE — Progress Notes (Signed)
Called to room to assist during endoscopic procedure.  Patient ID and intended procedure confirmed with present staff. Received instructions for my participation in the procedure from the performing physician.  

## 2022-08-12 NOTE — Telephone Encounter (Signed)
-----   Message from Sharyn Creamer, MD sent at 08/12/2022 12:17 PM EDT ----- Jamas Lav, I just did an EGD on this patient. Could we get a chest CT w/contrast to rule out source of extrinsic compression in the esophagus? Thanks.

## 2022-08-12 NOTE — Progress Notes (Signed)
Report to PACU, RN, vss, BBS= Clear.  

## 2022-08-12 NOTE — Progress Notes (Signed)
GASTROENTEROLOGY PROCEDURE H&P NOTE   Primary Care Physician: Eulas Post, MD    Reason for Procedure:   Dysphagia  Plan:    EGD  Patient is appropriate for endoscopic procedure(s) in the ambulatory ([facility]) setting.  The nature of the procedure, as well as the risks, benefits, and alternatives were carefully and thoroughly reviewed with the patient. Ample time for discussion and questions allowed. The patient understood, was satisfied, and agreed to proceed.     HPI: Morgan Wolfe is a 67 y.o. female who presents for EGD for evaluation of dysphagia .  Patient was most recently seen in the Gastroenterology Clinic on 06/08/22.  No interval change in medical history since that appointment. Please refer to that note for full details regarding GI history and clinical presentation.   Past Medical History:  Diagnosis Date   ALLERGIC RHINITIS    Hypertension    PONV (postoperative nausea and vomiting)    Right shoulder pain     Past Surgical History:  Procedure Laterality Date   ANKLE ARTHROSCOPY Left 01/03/2019   Procedure: LEFT ANKLE ARTHROSCOPY AND DEBRIDEMENT;  Surgeon: Newt Minion, MD;  Location: [facility];  Service: Orthopedics;  Laterality: Left;   ANKLE SURGERY Left 2000   ELBOW ARTHROSCOPY Right 2019   TOE SURGERY Right 1998   bone spur   VAGINAL HYSTERECTOMY  1980   per Dr. Harrington Challenger for fibroids; no cancer    Prior to Admission medications   Medication Sig Start Date End Date Taking? Authorizing Provider  allopurinol (ZYLOPRIM) 100 MG tablet Take 1 tablet (100 mg total) by mouth daily. Patient taking differently: Take 100 mg by mouth daily as needed. 12/14/21   Trula Slade, DPM  estradiol (ESTRACE VAGINAL) 0.1 MG/GM vaginal cream Apply one applicator per vagina daily for 2 weeks and then one applicator per vagina three times weekly 07/15/21   Eulas Post, MD  fluticasone (FLONASE) 50 MCG/ACT nasal spray SHAKE LIQUID AND USE 2 SPRAYS  IN EACH NOSTRIL DAILY Patient taking differently: 2 sprays daily as needed. SHAKE LIQUID AND USE 2 SPRAYS IN EACH NOSTRIL DAILY 04/30/21   Koberlein, Steele Berg, MD  loratadine (CLARITIN) 10 MG tablet Take 1 tablet (10 mg total) by mouth daily. Patient taking differently: Take 10 mg by mouth daily as needed for allergies. 04/30/21   Caren Macadam, MD  meclizine (ANTIVERT) 25 MG tablet Take 1 tablet (25 mg total) by mouth 3 (three) times daily as needed for dizziness. 04/30/21   Caren Macadam, MD  meloxicam (MOBIC) 7.5 MG tablet Take 1 tablet (7.5 mg total) by mouth daily as needed for pain. 12/09/21   Trula Slade, DPM  omeprazole (PRILOSEC) 40 MG capsule Take 40 mg by mouth daily.    [provider]  triamterene-hydrochlorothiazide (DYAZIDE) 37.5-25 MG capsule Take 1 each (1 capsule total) by mouth daily. 04/30/21   Caren Macadam, MD    Current Outpatient Medications  Medication Sig Dispense Refill   triamterene-hydrochlorothiazide (DYAZIDE) 37.5-25 MG capsule Take 1 each (1 capsule total) by mouth daily. 90 capsule 1   allopurinol (ZYLOPRIM) 100 MG tablet Take 1 tablet (100 mg total) by mouth daily. (Patient taking differently: Take 100 mg by mouth daily as needed.) 30 tablet 1   estradiol (ESTRACE VAGINAL) 0.1 MG/GM vaginal cream Apply one applicator per vagina daily for 2 weeks and then one applicator per vagina three times weekly 42.5 g 11   fluticasone (FLONASE) 50 MCG/ACT  nasal spray SHAKE LIQUID AND USE 2 SPRAYS IN EACH NOSTRIL DAILY (Patient taking differently: 2 sprays daily as needed. SHAKE LIQUID AND USE 2 SPRAYS IN EACH NOSTRIL DAILY) 16 g 5   loratadine (CLARITIN) 10 MG tablet Take 1 tablet (10 mg total) by mouth daily. (Patient taking differently: Take 10 mg by mouth daily as needed for allergies.) 90 tablet 1   meclizine (ANTIVERT) 25 MG tablet Take 1 tablet (25 mg total) by mouth 3 (three) times daily as needed for dizziness. 30 tablet 4   meloxicam  (MOBIC) 7.5 MG tablet Take 1 tablet (7.5 mg total) by mouth daily as needed for pain. 30 tablet 0   omeprazole (PRILOSEC) 40 MG capsule Take 40 mg by mouth daily.     No current facility-administered medications for this visit.    Allergies as of 08/12/2022 - Review Complete 08/12/2022  Allergen Reaction Noted   Augmentin [amoxicillin-pot clavulanate] Nausea And Vomiting 07/30/2014   Sulfonamide derivatives Itching     Family History  Problem Relation Age of Onset   Glaucoma Mother    Heart failure Mother    Kidney failure Mother    Pancreatic cancer Father    Breast cancer Sister    Brain cancer Sister    Heart attack Brother 2   Stroke Brother 68   Coronary artery disease Other        1st degress relatives <50   Hypertension Other    Hyperlipidemia Other    Colon cancer Neg Hx    Stomach cancer Neg Hx     Social History   Socioeconomic History   Marital status: Single    Spouse name: Not on file   Number of children: 0   Years of education: Not on file   Highest education level: Some college, no degree  Occupational History   Occupation: retired  Tobacco Use   Smoking status: Never   Smokeless tobacco: Never  Vaping Use   Vaping Use: Never used  Substance and Sexual Activity   Alcohol use: No   Drug use: No   Sexual activity: Not on file  Other Topics Concern   Not on file  Social History Narrative   Not on file   Social Determinants of Health   Financial Resource Strain: Low Risk  (07/10/2022)   Overall Financial Resource Strain (CARDIA)    Difficulty of Paying Living Expenses: Not hard at all  Food Insecurity: No Food Insecurity (07/10/2022)   Hunger Vital Sign    Worried About Running Out of Food in the Last Year: Never true    [place] in the Last Year: Never true  Transportation Needs: No Transportation Needs (07/10/2022)   PRAPARE - Transportation    Lack of Transportation (Medical): No    Lack of Transportation (Non-Medical): No   Physical Activity: Sufficiently Active (07/10/2022)   Exercise Vital Sign    Days of Exercise per Week: 3 days    Minutes of Exercise per Session: 60 min  Stress: No Stress Concern Present (07/10/2022)   [place]    Feeling of Stress : Not at all  Social Connections: Moderately Integrated (07/10/2022)   Social Connection and Isolation Panel [NHANES]    Frequency of Communication with Friends and Family: More than three times a week    Frequency of Social Gatherings with Friends and Family: More than three times a week    Attends Religious Services: More than 4 times  per year    Active Member of Clubs or Organizations: Yes    Attends Archivist Meetings: More than 4 times per year    Marital Status: Never married  Intimate Partner Violence: Not At Risk (07/10/2022)   Humiliation, Afraid, Rape, and Kick questionnaire    Fear of Current or Ex-Partner: No    Emotionally Abused: No    Physically Abused: No    Sexually Abused: No    Physical Exam: Vital signs in last 24 hours: BP 124/75   Pulse 74   Temp 98.7 F (37.1 C)   Ht 5\' 6"  (1.676 m)   Wt 197 lb (89.4 kg)   SpO2 100%   BMI 31.80 kg/m  GEN: NAD EYE: Sclerae anicteric ENT: MMM CV: Non-tachycardic Pulm: No increased WOB GI: Soft NEURO:  Alert & Oriented   Christia Reading, MD [facility]   08/12/2022 11:48 AM

## 2022-08-12 NOTE — Patient Instructions (Signed)
YOU HAD AN ENDOSCOPIC PROCEDURE TODAY AT [facility]:   Refer to the procedure report that was given to you for any specific questions about what was found during the examination.  If the procedure report does not answer your questions, please call your gastroenterologist to clarify.  If you requested that your care partner not be given the details of your procedure findings, then the procedure report has been included in a sealed envelope for you to review at your convenience later.  YOU SHOULD EXPECT: Some feelings of bloating in the abdomen. Passage of more gas than usual.  Walking can help get rid of the air that was put into your GI tract during the procedure and reduce the bloating. If you had a lower endoscopy (such as a colonoscopy or flexible sigmoidoscopy) you may notice spotting of blood in your stool or on the toilet paper. If you underwent a bowel prep for your procedure, you may not have a normal bowel movement for a few days.  Please Note:  You might notice some irritation and congestion in your nose or some drainage.  This is from the oxygen used during your procedure.  There is no need for concern and it should clear up in a day or so.  SYMPTOMS TO REPORT IMMEDIATELY:  Following lower endoscopy (colonoscopy or flexible sigmoidoscopy):  Excessive amounts of blood in the stool  Significant tenderness or worsening of abdominal pains  Swelling of the abdomen that is new, acute  Fever of 100F or higher  Following upper endoscopy (EGD)  Vomiting of blood or coffee ground material  New chest pain or pain under the shoulder blades  Painful or persistently difficult swallowing  New shortness of breath  Fever of 100F or higher  Black, tarry-looking stools  For urgent or emergent issues, a gastroenterologist can be reached at any hour by calling [phone]. Do not use MyChart messaging for urgent concerns.    DIET:  We do recommend : Follow dilatation diet  today.  Drink plenty of fluids but you should avoid alcoholic beverages for 24 hours.  ACTIVITY:  You should plan to take it easy for the rest of today and you should NOT DRIVE or use heavy machinery until tomorrow (because of the sedation medicines used during the test).    FOLLOW UP: Our staff will call the number listed on your records the next business day following your procedure.  We will call around 7:15- 8:00 am to check on you and address any questions or concerns that you may have regarding the information given to you following your procedure. If we do not reach you, we will leave a message.     If any biopsies were taken you will be contacted by phone or by letter within the next 1-3 weeks.  Please call us at [phone] if you have not heard about the biopsies in 3 weeks.    SIGNATURES/CONFIDENTIALITY: You and/or your care partner have signed paperwork which will be entered into your electronic medical record.  These signatures attest to the fact that that the information above on your After Visit Summary has been reviewed and is understood.  Full responsibility of the confidentiality of this discharge information lies with you and/or your care-partner.  Follow post dilatation diet today- handout given to you & reviewed with you  Await pathology results on biopsies of duodenal polyps and of esophagus   Await Dr Libby Maw plan for Chest CT - Dr Libby Maw  office will order

## 2022-08-13 ENCOUNTER — Telehealth: Payer: Self-pay

## 2022-08-13 NOTE — Telephone Encounter (Signed)
  Follow up Call-     08/12/2022   11:27 AM  Call back number  Post procedure Call Back phone  # [phone]  Permission to leave phone message Yes     Patient questions:  Do you have a fever, pain , or abdominal swelling? No. Pain Score  0 *  Have you tolerated food without any problems? Yes.    Have you been able to return to your normal activities? Yes.    Do you have any questions about your discharge instructions: Diet   No. Medications  No. Follow up visit  No.  Do you have questions or concerns about your Care? No.  Actions: * If pain score is 4 or above: No action needed, pain <4.

## 2022-08-19 ENCOUNTER — Encounter: Payer: Self-pay | Admitting: Internal Medicine

## 2022-08-27 ENCOUNTER — Ambulatory Visit (HOSPITAL_COMMUNITY): Payer: Medicare HMO

## 2022-09-09 ENCOUNTER — Encounter (HOSPITAL_COMMUNITY): Payer: Self-pay

## 2022-09-09 ENCOUNTER — Ambulatory Visit (HOSPITAL_COMMUNITY): Payer: Medicare HMO

## 2022-09-29 ENCOUNTER — Ambulatory Visit: Payer: Medicare HMO | Admitting: Internal Medicine

## 2022-10-27 ENCOUNTER — Ambulatory Visit: Payer: Medicare HMO | Admitting: Internal Medicine

## 2022-11-12 ENCOUNTER — Other Ambulatory Visit: Payer: Self-pay | Admitting: Family Medicine

## 2022-11-12 DIAGNOSIS — Z1231 Encounter for screening mammogram for malignant neoplasm of breast: Secondary | ICD-10-CM

## 2022-11-16 ENCOUNTER — Ambulatory Visit
Admission: RE | Admit: 2022-11-16 | Discharge: 2022-11-16 | Disposition: A | Discharge status: Home / Self Care | Payer: Medicare HMO | Source: Ambulatory Visit | Attending: Family Medicine | Admitting: Family Medicine

## 2022-11-16 DIAGNOSIS — Z1231 Encounter for screening mammogram for malignant neoplasm of breast: Secondary | ICD-10-CM

## 2022-12-29 ENCOUNTER — Other Ambulatory Visit: Payer: Self-pay

## 2022-12-29 ENCOUNTER — Telehealth: Payer: Self-pay | Admitting: Internal Medicine

## 2022-12-29 ENCOUNTER — Encounter: Payer: Self-pay | Admitting: Internal Medicine

## 2022-12-29 DIAGNOSIS — R1310 Dysphagia, unspecified: Secondary | ICD-10-CM

## 2022-12-29 NOTE — Telephone Encounter (Signed)
Spoke with the patient. She was to get a CT chest with contrast after her EGD done in March 2024. Patient did not schedule. She would like to have this done now. Order sent to [facility].

## 2022-12-29 NOTE — Telephone Encounter (Signed)
Inbound call from patient requesting to know if she is able to have CT order sent to [facility] where her insurance will cover it. Patient requesting a call back. Please advise, thank you.

## 2022-12-30 ENCOUNTER — Ambulatory Visit
Admission: RE | Admit: 2022-12-30 | Discharge: 2022-12-30 | Disposition: A | Discharge status: Home / Self Care | Payer: Medicare HMO | Source: Ambulatory Visit | Attending: Internal Medicine | Admitting: Internal Medicine

## 2022-12-30 DIAGNOSIS — R1310 Dysphagia, unspecified: Secondary | ICD-10-CM

## 2022-12-30 MED ORDER — IOPAMIDOL (ISOVUE-300) INJECTION 61%
80.0000 mL | Freq: Once | INTRAVENOUS | Status: AC | PRN
  Administered 2022-12-30: 80 mL via INTRAVENOUS

## 2023-01-08 ENCOUNTER — Telehealth: Payer: Self-pay | Admitting: Internal Medicine

## 2023-01-08 NOTE — Telephone Encounter (Signed)
PT is calling to have CT results sent to the correct doctor. It needs to go to Dr. Delia Chimes at [facility]

## 2023-01-08 NOTE — Telephone Encounter (Signed)
Routed per patient request.

## 2023-01-12 ENCOUNTER — Ambulatory Visit: Payer: Medicare HMO | Admitting: Family Medicine

## 2023-06-14 ENCOUNTER — Other Ambulatory Visit: Payer: Self-pay | Admitting: Family Medicine

## 2023-06-14 DIAGNOSIS — Z780 Asymptomatic menopausal state: Secondary | ICD-10-CM

## 2023-07-23 ENCOUNTER — Other Ambulatory Visit: Payer: Self-pay | Admitting: Family Medicine

## 2023-07-23 DIAGNOSIS — Z1231 Encounter for screening mammogram for malignant neoplasm of breast: Secondary | ICD-10-CM

## 2023-07-29 ENCOUNTER — Telehealth: Payer: Self-pay | Admitting: Internal Medicine

## 2023-07-29 NOTE — Telephone Encounter (Signed)
 Copy of OV & EGD notes faxed to [facility] as requested.

## 2023-07-29 NOTE — Telephone Encounter (Signed)
 [employer] Rep Called and stated that she needed to speak to the nurse regarding the patient EGD procedure and that they are needing additional information (chief of complaint and duration as well as PPE treatment and most recent office visit) A good call back number for them is [phone] and they are needing a response in 72 hours. [employer] Rep provided me with a fax number as well [phone].Please advise.

## 2023-07-30 NOTE — Telephone Encounter (Signed)
 Spoke with [employer] Rep & was provided new fax number since notes are not going through to the previous number. Refaxed notes to [phone]. Tracking number: [id number].

## 2023-08-17 ENCOUNTER — Ambulatory Visit: Payer: Medicare HMO | Attending: Cardiology | Admitting: Cardiology

## 2023-08-17 VITALS — BP 122/70 | HR 60 | Ht 66.0 in | Wt 206.0 lb

## 2023-08-17 DIAGNOSIS — E66811 Obesity, class 1: Secondary | ICD-10-CM | POA: Diagnosis not present

## 2023-08-17 DIAGNOSIS — R0609 Other forms of dyspnea: Secondary | ICD-10-CM | POA: Diagnosis not present

## 2023-08-17 DIAGNOSIS — I10 Essential (primary) hypertension: Secondary | ICD-10-CM | POA: Diagnosis not present

## 2023-08-17 DIAGNOSIS — Z8249 Family history of ischemic heart disease and other diseases of the circulatory system: Secondary | ICD-10-CM

## 2023-08-17 DIAGNOSIS — R072 Precordial pain: Secondary | ICD-10-CM | POA: Diagnosis not present

## 2023-08-17 LAB — BASIC METABOLIC PANEL WITH GFR
BUN/Creatinine Ratio: 13 (ref 12–28)
BUN: 12 mg/dL (ref 8–27)
CO2: 27 mmol/L (ref 20–29)
Calcium: 10 mg/dL (ref 8.7–10.3)
Chloride: 103 mmol/L (ref 96–106)
Creatinine, Ser: 0.89 mg/dL (ref 0.57–1.00)
Glucose: 102 mg/dL — ABNORMAL HIGH (ref 70–99)
Potassium: 4 mmol/L (ref 3.5–5.2)
Sodium: 143 mmol/L (ref 134–144)
eGFR: 72 mL/min/{1.73_m2} (ref >59)

## 2023-08-17 NOTE — Progress Notes (Unsigned)
 Cardiology Office Note:  .   Date:  08/18/2023  ID:  Morgan Wolfe, DOB 1955/07/26, MRN [id number] PCP: Sharmon Revere, MD  [facility] Providers Cardiologist:  Bryan Lemma, MD     Chief Complaint  Patient presents with   New Patient (Initial Visit)    Evaluation of chest pain and shortness of breath    Patient Profile: .     Morgan Wolfe is an obese 68 y.o. female  with a PMH notable for hypertension, HLD,"structural heart disease "who presents here for evaluation of chest pain And Exertional Dyspnea at the request of Paliwal, Himanshu, MD.  Other PMH includes gout; reported stage B CHF from hypertensive heart disease; mild dementia and prediabetes     Morgan Wolfe was last seen on 06/12/2023 by Dr. Delia Chimes -> noted chest pain and shortness of breath, referred to cardiology evaluation.  Echocardiogram performed at the office and was summarized in the note, but the actual report is not provided.  Subjective  Discussed the use of AI scribe software for clinical note transcription with the patient, who gave verbal consent to proceed. History of Present Illness Morgan Wolfe "Morgan Wolfe" is a 68 year old female with hypertension who presents with chest pain and shortness of breath. She was referred by another doctor for evaluation of her chest pain and shortness of breath.  She experiences right-sided chest pain and shortness of breath, particularly during physical activities such as walking or doing household chores. The pain occurs every time she walks to the end of her driveway but has improved somewhat with medication. The pain subsides with rest. These symptoms have been present for approximately six months, with an onset around last Christmas.  She also has difficulty breathing when lying on her back, which sometimes wakes her up at night, requiring her to sit up to catch her breath. No swelling in her legs or episodes of passing out.  Her current medications include  losartan 100 mg daily, amlodipine 5 mg daily, and metoprolol tartrate 50 mg twice a day. She also uses a nasal spray as needed. She was previously on Dyazide but is no longer taking it.  Her family history is significant for her mother having passed away from heart disease at the age of 61.  She denies any personal history of smoking. Cardiovascular ROS: positive for - chest pain, dyspnea on exertion, and paroxysmal nocturnal dyspnea negative for - edema, irregular heartbeat, palpitations, rapid heart rate, shortness of breath, or syncope/ischemia here or TIA/amaurosis fugax, claudication  ROS:  Review of Systems - Negative except per HPI    Objective   Meds: BP: Amlodipine 5 mg daily, losartan 100 mg daily, blood pressure 50 mg twice daily, triamterene-HCTZ 37.5-25 mg daily. Allopurinol 100 mg daily (for gout), fluticasone spray 2 sprays daily per nostril, as needed Antivert, as needed Mobic, as needed Prilosec  Studies Reviewed: Marland Kitchen   EKG Interpretation Date/Time:  Tuesday August 17 2023 10:04:39 EDT Ventricular Rate:  60 PR Interval:  190 QRS Duration:  82 QT Interval:  424 QTC Calculation: 424 R Axis:   -21  Text Interpretation: Normal sinus rhythm Moderate voltage criteria for LVH, may be normal variant ( R in aVL , Cornell product ) When compared with ECG of 02-Jan-2019 07:48, Criteria for Anterior infarct are no longer Present Confirmed by Bryan Lemma ([id number]) on 08/18/2023 12:03:45 AM    From [facility]  ECHO 10/08/2022: EF 55 to 60%.  Mild LVH.  Zio patch monitor March-April 2024: Sinus rhythm at a rate of 43 to 138 bpm, average 67 bpm.  Rare PACs and PVCs.  No arrhythmias.  Labs from PCP dated 11/12/2022: Creatinine 0.7.  Otherwise normal;   Risk Assessment/Calculations:             Physical Exam:   VS:  BP 122/70 (BP Location: Left Arm, Patient Position: Sitting, Cuff Size: Large)   Pulse 60   Ht 5\' 6"  (1.676 m)   Wt 206 lb (93.4 kg)   BMI 33.25 kg/m     Wt Readings from Last 3 Encounters:  08/17/23 206 lb (93.4 kg)  08/12/22 197 lb (89.4 kg)  07/10/22 193 lb (87.5 kg)    GEN: Well nourished, well developed in no acute distress; mildly obese  NECK: No JVD; No carotid bruits CARDIAC: Normal S1, S2; RRR, no murmurs, rubs, gallops RESPIRATORY:  Clear to auscultation without rales, wheezing or rhonchi ; nonlabored, good air movement. ABDOMEN: Soft, non-tender, non-distended EXTREMITIES:  No edema; No deformity      ASSESSMENT AND PLAN: .    Problem List Items Addressed This Visit       Cardiology Problems   Essential hypertension - Primary (Chronic)   Managed with losartan, amlodipine, and metoprolol. Previously on Dyazide. Blood pressure management is crucial, especially if coronary artery disease is confirmed. Amlodipine has alleviated some symptoms, suggesting a possible cardiac origin. - Continue current antihypertensive regimen with losartan, amlodipine, and metoprolol. - Reassess blood pressure management after coronary CT results.      Relevant Medications   amLODipine (NORVASC) 5 MG tablet   metoprolol tartrate (LOPRESSOR) 50 MG tablet   losartan (COZAAR) 100 MG tablet   Other Relevant Orders   EKG 12-Lead (Completed)     Other   DOE (dyspnea on exertion)   Echo reassuring.  Checking Coronary CTA to exclude CAD given presence of chest tightness.      Relevant Orders   Basic metabolic panel with GFR (Completed)   CT CORONARY MORPH W/CTA COR W/SCORE W/CA W/CM &/OR WO/CM   Family history of heart disease   Mother had heart disease and passed away at age 47, increasing her risk for coronary artery disease. - Consider family history in cardiovascular risk assessment and management plan.      Obesity (BMI 30.0-34.9) (Chronic)   Hypertension, obesity at least 2 and 3 components necessary for metabolic syndrome.  A1c was 6.1 per PCP note indicating prediabetes =>/glucose intolerance weaning this meets criteria for  metabolic syndrome. Need to see lipid levels and treat based on Coronary CTA results.       Precordial pain   Intermittent chest pain and dyspnea for six months, exacerbated by exertion and relieved by rest. Associated with wheezing and orthopnea. Echocardiogram was reassuring, but further evaluation is needed to determine if symptoms are cardiac in origin. Differential diagnosis includes coronary artery disease with possible small vessel involvement or non-cardiac causes. - Order coronary CT angiography to evaluate for coronary artery blockages. - Obtain updated chemistry panel to assess renal function prior to CT scan. - Consider more aggressive management of hyperlipidemia and hypertension based on CT findings. - Schedule follow-up appointment post-CT scan to discuss results and management.      Relevant Orders   Basic metabolic panel with GFR (Completed)   CT CORONARY MORPH W/CTA COR W/SCORE W/CA W/CM &/OR WO/CM         Follow-Up: Return in about 2 months (around 10/17/2023) for Routine Follow-up  after testing ~ 1-2 months, [employer].     Signed, Marykay Lex, MD, MS Bryan Lemma, M.D., M.S. Interventional Cardiologist  [facility]  Pager # [phone] Phone # [phone] [address] [place], [place] [address]

## 2023-08-17 NOTE — Patient Instructions (Addendum)
 Medication Instructions:    See below instrucitons  *If you need a refill on your cardiac medications before your next appointment, please call your pharmacy*   Lab Work: BMP  If you have labs (blood work) drawn today and your tests are completely normal, you will receive your results only by: MyChart Message (if you have MyChart) OR A paper copy in the mail If you have any lab test that is abnormal or we need to change your treatment, we will call you to review the results.   Testing/Procedures:  Will be done at [facility] - Radiology Dept. Your physician has requested that you have coronary  CTA. Coronary computed tomography (CT)angiogram  is a special type of CT scan that uses a computer to produce multi-dimensional views of major blood vessels throughout the heart.  CT angiography, a contrast material is injected through an IV to help visualize the blood vessels  a painless test that uses an x-ray machine to take clear, detailed pictures of your heart arteries .  Please follow instruction sheet as given.    Follow-Up: At [facility], you and your health needs are our priority.  As part of our continuing mission to provide you with exceptional heart care, we have created designated Provider Care Teams.  These Care Teams include your primary Cardiologist (physician) and Advanced Practice Providers (APPs -  Physician Assistants and Nurse Practitioners) who all work together to provide you with the care you need, when you need it.     Your next appointment:   2 month(s)  The format for your next appointment:   In Person  Provider:   Bryan Lemma, MD    Other Instructions     Your cardiac CT will be scheduled at one of the below locations:   [facility] [address] [place], [place] [address] [phone]    Please arrive at the [facility] and Children's Entrance (Entrance C2) of [facility] 30 minutes prior to test start time. You can  use the FREE valet parking offered at entrance C (encouraged to control the heart rate for the test)  Proceed to the [facility] Radiology Department (first floor) to check-in and test prep.  All radiology patients and guests should use entrance C2 at [facility], accessed from [facility], even though the hospital's physical address listed is [address]      Please follow these instructions carefully (unless otherwise directed):  An IV will be required for this test and Nitroglycerin will be given.    On the Night Before the Test: Be sure to Drink plenty of water. Do not consume any caffeinated/decaffeinated beverages or chocolate 12 hours prior to your test. Do not take any antihistamines 12 hours prior to your test.  On the Day of the Test: Drink plenty of water until 1 hour prior to the test. Do not eat any food 1 hour prior to test. You may take your regular medications prior to the test.  Take metoprolol (Lopressor) 100 mg  two hours prior to test. ( 2 tablet of your regular dose and do not take even dose) If you take -triamterene-Hydrochlorothiazide, please HOLD on the morning of the test.  FEMALES- please wear underwire-free bra if available, avoid dresses & tight clothing   After the Test: Drink plenty of water. After receiving IV contrast, you may experience a mild flushed feeling. This is normal. On occasion, you may experience a mild rash up to  24 hours after the test. This is not dangerous. If this occurs, you can take Benadryl 25 mg, Zyrtec, Claritin, or Allegra and increase your fluid intake. (Patients taking Tikosyn should avoid Benadryl, and may take Zyrtec, Claritin, or Allegra) If you experience trouble breathing, this can be serious. If it is severe call 911 IMMEDIATELY. If it is mild, please call our office.  We will call to schedule your test 2-4 weeks out understanding that some insurance companies will need an authorization  prior to the service being performed.   For more information and frequently asked questions, please visit our website : [email or web address]  For non-scheduling related questions, please contact the cardiac imaging nurse navigator should you have any questions/concerns: Cardiac Imaging Nurse Navigators Direct Office Dial: [phone]   For scheduling needs, including cancellations and rescheduling, please call [place], [phone].      s

## 2023-08-18 ENCOUNTER — Encounter: Payer: Self-pay | Admitting: Cardiology

## 2023-08-18 DIAGNOSIS — Z8249 Family history of ischemic heart disease and other diseases of the circulatory system: Secondary | ICD-10-CM | POA: Insufficient documentation

## 2023-08-18 NOTE — Assessment & Plan Note (Signed)
 Echo reassuring.  Checking Coronary CTA to exclude CAD given presence of chest tightness.

## 2023-08-18 NOTE — Assessment & Plan Note (Signed)
 Managed with losartan, amlodipine, and metoprolol. Previously on Dyazide. Blood pressure management is crucial, especially if coronary artery disease is confirmed. Amlodipine has alleviated some symptoms, suggesting a possible cardiac origin. - Continue current antihypertensive regimen with losartan, amlodipine, and metoprolol. - Reassess blood pressure management after coronary CT results.

## 2023-08-18 NOTE — Assessment & Plan Note (Signed)
 Intermittent chest pain and dyspnea for six months, exacerbated by exertion and relieved by rest. Associated with wheezing and orthopnea. Echocardiogram was reassuring, but further evaluation is needed to determine if symptoms are cardiac in origin. Differential diagnosis includes coronary artery disease with possible small vessel involvement or non-cardiac causes. - Order coronary CT angiography to evaluate for coronary artery blockages. - Obtain updated chemistry panel to assess renal function prior to CT scan. - Consider more aggressive management of hyperlipidemia and hypertension based on CT findings. - Schedule follow-up appointment post-CT scan to discuss results and management.

## 2023-08-18 NOTE — Assessment & Plan Note (Addendum)
 Hypertension, obesity at least 2 and 3 components necessary for metabolic syndrome.  A1c was 6.1 per PCP note indicating prediabetes =>/glucose intolerance weaning this meets criteria for metabolic syndrome. Need to see lipid levels and treat based on Coronary CTA results.

## 2023-08-18 NOTE — Assessment & Plan Note (Addendum)
 Mother had heart disease and passed away at age 66, increasing her risk for coronary artery disease. - Consider family history in cardiovascular risk assessment and management plan.

## 2023-08-27 ENCOUNTER — Encounter (HOSPITAL_COMMUNITY): Payer: Self-pay

## 2023-08-30 ENCOUNTER — Telehealth (HOSPITAL_COMMUNITY): Payer: Self-pay | Admitting: Emergency Medicine

## 2023-08-30 NOTE — Telephone Encounter (Signed)
 Attempted to call patient regarding upcoming cardiac CT appointment. Left message on voicemail with name and callback number Rockwell Alexandria RN Navigator Cardiac Imaging [facility] [phone] Office [phone] Cell

## 2023-08-31 ENCOUNTER — Ambulatory Visit (HOSPITAL_COMMUNITY)
Admission: RE | Admit: 2023-08-31 | Discharge: 2023-08-31 | Disposition: A | Discharge status: Home / Self Care | Source: Ambulatory Visit | Attending: Cardiology | Admitting: Cardiology

## 2023-08-31 ENCOUNTER — Other Ambulatory Visit: Payer: Self-pay | Admitting: Cardiology

## 2023-08-31 DIAGNOSIS — R931 Abnormal findings on diagnostic imaging of heart and coronary circulation: Secondary | ICD-10-CM | POA: Insufficient documentation

## 2023-08-31 DIAGNOSIS — I2510 Atherosclerotic heart disease of native coronary artery without angina pectoris: Secondary | ICD-10-CM

## 2023-08-31 DIAGNOSIS — R0609 Other forms of dyspnea: Secondary | ICD-10-CM | POA: Diagnosis present

## 2023-08-31 DIAGNOSIS — R072 Precordial pain: Secondary | ICD-10-CM | POA: Insufficient documentation

## 2023-08-31 MED ORDER — NITROGLYCERIN 0.4 MG SL SUBL
0.8000 mg | SUBLINGUAL_TABLET | Freq: Once | SUBLINGUAL | Status: AC
  Administered 2023-08-31: 0.8 mg via SUBLINGUAL

## 2023-08-31 MED ORDER — NITROGLYCERIN 0.4 MG SL SUBL
SUBLINGUAL_TABLET | SUBLINGUAL | Status: AC
  Filled 2023-08-31: qty 2

## 2023-08-31 MED ORDER — IOHEXOL 350 MG/ML SOLN
100.0000 mL | Freq: Once | INTRAVENOUS | Status: AC | PRN
  Administered 2023-08-31: 100 mL via INTRAVENOUS

## 2023-09-02 ENCOUNTER — Encounter: Payer: Self-pay | Admitting: Cardiology

## 2023-09-03 ENCOUNTER — Telehealth: Payer: Self-pay | Admitting: *Deleted

## 2023-09-03 NOTE — Telephone Encounter (Signed)
-----   Message from [name] sent at 09/02/2023  5:48 PM EDT ----- Coronary CT scan results are somewhat concerning the Coronary Calcium Score that is not all that elevated at only 59, but it appears that the left anterior sending artery may be completely closed in the mid vessel.  The right coronary has some moderate disease as well.  With her symptoms being somewhat atypical not sure to think about this but it does look like it could very well be related to heart artery disease.  At this point we probably need her to come again to be seen to discuss these results and determine whether or not we proceed with cardiac catheterization.  If she is still indeed having chest discomfort and exertional dyspnea I would recommend that we do proceed with catheterization just to determine what we actually see.  If the LAD is not fully occluded it may be something to be treated.  Would be best to see me but could also see an APP.  [name], MD

## 2023-09-03 NOTE — Telephone Encounter (Signed)
 Called left a message on answer machine - f/u appointment to discuss recent CCTA  . Appointment scheduled for 09/29/23 at 1:140   RN asked patient to call back if unable to make that appointment

## 2023-09-15 NOTE — Telephone Encounter (Signed)
 See result note. Spoke to patient an appointment made for 09/2023

## 2023-09-29 ENCOUNTER — Telehealth: Payer: Self-pay | Admitting: Cardiology

## 2023-09-29 ENCOUNTER — Ambulatory Visit: Attending: Cardiology | Admitting: Cardiology

## 2023-09-29 ENCOUNTER — Encounter: Payer: Self-pay | Admitting: Cardiology

## 2023-09-29 VITALS — BP 130/72 | HR 60 | Ht 66.0 in | Wt 205.0 lb

## 2023-09-29 DIAGNOSIS — R072 Precordial pain: Secondary | ICD-10-CM | POA: Diagnosis not present

## 2023-09-29 DIAGNOSIS — M545 Low back pain, unspecified: Secondary | ICD-10-CM

## 2023-09-29 DIAGNOSIS — R0609 Other forms of dyspnea: Secondary | ICD-10-CM

## 2023-09-29 DIAGNOSIS — Z01812 Encounter for preprocedural laboratory examination: Secondary | ICD-10-CM

## 2023-09-29 DIAGNOSIS — R931 Abnormal findings on diagnostic imaging of heart and coronary circulation: Secondary | ICD-10-CM | POA: Insufficient documentation

## 2023-09-29 DIAGNOSIS — E785 Hyperlipidemia, unspecified: Secondary | ICD-10-CM

## 2023-09-29 DIAGNOSIS — I10 Essential (primary) hypertension: Secondary | ICD-10-CM | POA: Diagnosis not present

## 2023-09-29 DIAGNOSIS — Z8249 Family history of ischemic heart disease and other diseases of the circulatory system: Secondary | ICD-10-CM

## 2023-09-29 DIAGNOSIS — E66811 Obesity, class 1: Secondary | ICD-10-CM

## 2023-09-29 NOTE — Patient Instructions (Addendum)
 Medication Instructions:  Your physician recommends that you continue on your current medications as directed. Please refer to the Current Medication list given to you today.  *If you need a refill on your cardiac medications before your next appointment, please call your pharmacy*   Lab Work: CBC BMET    If you have labs (blood work) drawn today and your tests are completely normal, you will receive your results only by: MyChart Message (if you have MyChart) OR A paper copy in the mail If you have any lab test that is abnormal or we need to change your treatment, we will call you to review the results.   Testing/Procedures:  [facility] A DEPT OF [facility] AT [address] A DEPT OF THE [place] [address] [place] [address] Dept: [phone] Loc: [phone]  JOLAN SUNDT  09/29/2023  You are scheduled for a Cardiac Catheterization on Monday, June 2 with Dr. [name].  1. Please arrive at the [facility] (Main Entrance A) at [facility]: [address] [place], [place] [address] at 5:30 AM (This time is 2 hour(s) before your procedure to ensure your preparation).   Free valet parking service is available. You will check in at ADMITTING. The support person will be asked to wait in the waiting room.  It is OK to have someone drop you off and come back when you are ready to be discharged.    Special note: Every effort is made to have your procedure done on time. Please understand that emergencies sometimes delay scheduled procedures.  2. Diet: Do not eat solid foods after midnight.  The patient may have clear liquids until 5am upon the day of the procedure.  3. Labs: CBC and BMP 09/29/23  4. Medication instructions in preparation for your procedure:   Contrast Allergy: No  Do not take Losartan on the morning of your procedure.   On the morning of your procedure, take your Aspirin 81 mg and any  morning medicines NOT listed above.  You may use sips of water.   You will need to drink 1 bottle (16 oz) of water on the way to the hospital.   5. Plan to go home the same day, you will only stay overnight if medically necessary. 6. Bring a current list of your medications and current insurance cards. 7. You MUST have a responsible person to drive you home. 8. Someone MUST be with you the first 24 hours after you arrive home or your discharge will be delayed. 9. Please wear clothes that are easy to get on and off and wear slip-on shoes.  Thank you for allowing us  to care for you!   -- [facility]    Follow-Up: At [facility], you and your health needs are our priority.  As part of our continuing mission to provide you with exceptional heart care, we have created designated Provider Care Teams.  These Care Teams include your primary Cardiologist (physician) and Advanced Practice Providers (APPs -  Physician Assistants and Nurse Practitioners) who all work together to provide you with the care you need, when you need it.  We recommend signing up for the patient portal called "MyChart".  Sign up information is provided on this After Visit Summary.  MyChart is used to connect with patients for Virtual Visits (Telemedicine).  Patients are able to view lab/test results, encounter notes, upcoming appointments, etc.  Non-urgent  messages can be sent to your provider as well.   To learn more about what you can do with MyChart, go to [email or web address].    Your next appointment:   6 week(s)  The format for your next appointment:   In Person  Provider:   Lawana Pray, NP, Marlana Silvan, NP, or Katlyn West, NP       OR  [name], MD will plan to see you again in 6 week(s).   Other Instructions

## 2023-09-29 NOTE — Telephone Encounter (Signed)
 Spoke with pt and she stated that Dr. Addie Holstein had stated she could reschedule her cath for 6/4 if she was unable to make the 6/2 appt d/t needing another adult to drive her to and from the procedure and then stay with her after. Pt stated she won't have anyone to go with her until 6/4. Called cath lab and was able to move appt to 6/4 at 8:30 AM.

## 2023-09-29 NOTE — Progress Notes (Unsigned)
 Cardiology Office Note:  .   Date:  09/30/2023  ID:  [name], DOB 05-Dec-1955, MRN [id number] PCP: Lorella Roles, MD  [facility] Providers Cardiologist:  [name], MD     No chief complaint on file.   Patient Profile: .     ADYLINE GRANDT is an  68 y.o. female with a PMH notable for hypertension and hyperlipidemia who presents here for follow-up evaluation after Coronary CTA.   At the request of Paliwal, Darnelle Elders, MD.  [name] was initially seen on August 17, 2023 for clinical evaluation of chest pain And Exertional Dyspnea at the request of Paliwal, Himanshu, MD. Other PMH includes gout; reported stage B CHF from hypertensive heart disease; mild dementia and prediabetes We ordered a coronary CTA to evaluate her exertional dyspnea and precordial pain.  She now presents to discuss results.    [name] referred for Coronary CTA after the initial visit on August 17, 2023.  She now presents for follow-up  Subjective  Discussed the use of AI scribe software for clinical note transcription with the patient, who gave verbal consent to proceed.  History of Present Illness Vernessa R Coughlin "Leighton Punches" is a 68 year old female with coronary artery disease who presents with shortness of breath.  She experiences shortness of breath during daily activities such as walking a block or going to the grocery store. Walking from her car to the office also causes shortness of breath.  She does not necessarily note chest pain or discomfort during these activities, but has notable dyspnea.. She does not experience shortness of breath when climbing stairs, but she rarely encounters stairs in her daily routine.  Rest  She sleeps on one pillow and does not wake up short of breath, although she sometimes feels like she cannot breathe when waking up on her back, preferring to sleep on her sides. No heart palpitations, dizziness, or syncope. There is no swelling in her legs, but she  reports swelling in her ankle, which she attributes to a past surgery in 2020.  9190  Her current medications include metoprolol 50 mg twice a day, losartan 100 mg daily, Norvasc 5 mg daily, and Mobic  daily. She is not taking aspirin and is not on any cholesterol medication.  A CT scan of her heart arteries was performed, revealing findings concerning for a blockage in one of the arteries. She recalls being told that her echocardiogram showed her heart was squeezing well.   Objective   Medications - Metoprolol 50 mg twice a day - Losartan 100 mg a day - Norvasc 5 mg a day - Mobic  7.5 mg every day - Not taking Aspirin - Flonase  daily per nostril - Prilosec 40 mg daily - Allopurinol  100 mg daily - As needed meclizine   Studies Reviewed: Aaron Aas   EKG Interpretation Date/Time:  Wednesday Sep 29 2023 13:41:40 EDT Ventricular Rate:  60 PR Interval:  164 QRS Duration:  82 QT Interval:  430 QTC Calculation: 430 R Axis:   -25  Text Interpretation: Normal sinus rhythm Minimal voltage criteria for LVH, may be normal variant ( R in aVL ) Possible Anteroseptal infarct , age undetermined When compared with ECG of 17-Aug-2023 10:04, No significant change was found Confirmed by [name] ([id number]) on 09/29/2023 1:48:10 PM    LABS Lipid panel: Total cholesterol 184 mg/dL, HDL 51 mg/dL, Triglycerides 308 mg/dL, LDL 99 mg/dL (65/7846)  RADIOLOGY CT Coronary Angiography (09/19/2023): LAD-mid 100%.  RCA-mild to moderate stenosis.  LCx- normal.  1. Left Main:  No significant stenosis. FFR = 1.00  2. LAD: FFR unable to be modeled beyond mid vessel. Proximal FFR =0.99, mid FFR = 0.97 3. LCX: No significant stenosis. Proximal FFR = 0.98, Distal FFR =0.97 4. RCA: No significant stenosis. Proximal FFR = 0.99, Mid FFR =0.83, Distal FFR = 0.80   IMPRESSION: 1. No flow modeled beyond mid LAD, concerning for chronic total occlusion. Findings communicated to Dr. Addie Holstein.  From [facility]  ECHO  10/08/2022: EF 55 to 60%.  Mild LVH.    Zio patch monitor March-April 2024: Sinus rhythm at a rate of 43 to 138 bpm, average 67 bpm.  Rare PACs and PVCs.  No arrhythmias.  Risk Assessment/Calculations:         Physical Exam:   VS:  BP 130/72   Pulse 60   Ht 5\' 6"  (1.676 m)   Wt 205 lb (93 kg)   SpO2 90%   BMI 33.09 kg/m    Wt Readings from Last 3 Encounters:  09/29/23 205 lb (93 kg)  08/17/23 206 lb (93.4 kg)  08/12/22 197 lb (89.4 kg)    GEN: Well nourished, well groomed.  In no acute distress; mildly obese. NECK: No JVD; No carotid bruits CARDIAC: Normal S1, S2; RRR, no murmurs, rubs, gallops RESPIRATORY:  Clear to auscultation without rales, wheezing or rhonchi ; nonlabored, good air movement. ABDOMEN: Soft, non-tender, non-distended EXTREMITIES:  No edema; No deformity      ASSESSMENT AND PLAN: .    Problem List Items Addressed This Visit       Cardiology Problems   Essential hypertension (Chronic)   Blood pressure seems to be pretty well-controlled on current dose of Norvasc 5 mg and losartan 100 mg along with Lopressor 50 mg twice daily. -Continue current meds for now.      Relevant Orders   EKG 12-Lead (Completed)   Hyperlipidemia with target low density lipoprotein (LDL) cholesterol less than 55 mg/dL (Chronic)   Not currently on any lipid therapy. Will start rosuvastatin 40 mg daily on discharge from Cath Lab. -Will plan to order fasting lipid panel with labs drawn in the Cath Lab.        Other   Abnormal cardiac CT angiography - Primary   CT scan shows complete occlusion of the left anterior descending artery, mild to moderate stenosis in the right coronary artery, and normal circumflex artery.  Echocardiogram reveals normal cardiac function.  Differential diagnosis includes false positive CT scan or collateral circulation.  Heart catheterization needed for definitive assessment and potential intervention.   - Schedule heart catheterization for October 18, 2023. Explained risks and she agreed to proceed. - Order CBC and renal function tests prior to procedure. - Perform lipid panel on procedure day. - Provide educational materials about heart catheterization.      Back pain   Cyst on fourth and fifth vertebrae Cyst causing back pain, managed with medication. No surgical intervention planned. Discussed accommodating back pain during heart catheterization. - Ensure staff adjusts positioning during heart catheterization.      DOE (dyspnea on exertion)   Her major symptom is exertional dyspnea concerned this could be her anginal equivalent especially given the potential significant CAD with occluded LAD showed shown on coronary CTA      Family history of heart disease   Relevant Orders   EKG 12-Lead (Completed)   Obesity (BMI 30.0-34.9) (Chronic)   Discussed importance of weight loss.  Hopefully once  were able to potentially help her exertional dyspnea with ischemic evaluation and potentially PCI of the LAD if an option, we can get her back exercising. Low threshold to consider GLP-1 agonist.      Precordial pain   Does not sound like she been having the chest discomfort as much congestive and exertional dyspnea.  Chest comfort not as prominent but it is exacerbated by exertion.  Concern for this being her anginal equivalent based on results of Coronary CTA.      Other Visit Diagnoses       Pre-procedure lab exam       Relevant Orders   CBC (Completed)   Basic metabolic panel with GFR (Completed)          Informed Consent   Shared Decision Making/Informed Consent The risks [stroke (1 in 1000), death (1 in 1000), kidney failure [usually temporary] (1 in 500), bleeding (1 in 200), allergic reaction [possibly serious] (1 in 200)], benefits (diagnostic support and management of coronary artery disease) and alternatives of a cardiac catheterization were discussed in detail with Ms. Stilling and she is willing to proceed. => Tentatively  scheduled for a Cardiac Catheterization on Monday, June 2 with Dr. [name].     Follow-Up: Return in about 3 weeks (around 10/20/2023) for Post-cath Followup with me, Post cath visit with APP.  Recording duration: 34 minutes Total time spent: 34 min spent with patient + 28 min spent charting = 62 min I spent 56 minutes in the care of [name] today including reviewing outside labs from [facility] (1 minute), reviewing studies (results.  Prior Echo but new Coronary CTA reviewed reviewed -9 minutes.), face to face time discussing treatment options (34 minutes), reviewing records from previous visit (4 minutes), 14 minutes dictating, and documenting in the encounter.     Signed, Arleen Lacer, MD, MS [name], M.D., M.S. Interventional Chartered certified accountant  Pager # [phone]

## 2023-09-29 NOTE — Telephone Encounter (Signed)
 Pt calling stating she cannot have Cath on 10/18/23 and needs to r/s

## 2023-09-29 NOTE — H&P (View-Only) (Signed)
 Cardiology Office Note:  .   Date:  09/30/2023  ID:  Morgan Wolfe, DOB [birth year], MRN [id number] PCP: Lorella Roles, MD  [facility] Providers Cardiologist:  [name], MD     No chief complaint on file.   Patient Profile: .     Morgan Wolfe is an  68 y.o. female with a PMH notable for hypertension and hyperlipidemia who presents here for follow-up evaluation after Coronary CTA.   At the request of Paliwal, Darnelle Elders, MD.  Morgan Wolfe was initially seen on August 17, 2023 for clinical evaluation of chest pain And Exertional Dyspnea at the request of Paliwal, Himanshu, MD. Other PMH includes gout; reported stage B CHF from hypertensive heart disease; mild dementia and prediabetes We ordered a coronary CTA to evaluate her exertional dyspnea and precordial pain.  She now presents to discuss results.    Morgan Wolfe referred for Coronary CTA after the initial visit on August 17, 2023.  She now presents for follow-up  Subjective  Discussed the use of AI scribe software for clinical note transcription with the patient, who gave verbal consent to proceed.  History of Present Illness Morgan Wolfe "Leighton Punches" is a 68 year old female with coronary artery disease who presents with shortness of breath.  She experiences shortness of breath during daily activities such as walking a block or going to the grocery store. Walking from her car to the office also causes shortness of breath.  She does not necessarily note chest pain or discomfort during these activities, but has notable dyspnea.. She does not experience shortness of breath when climbing stairs, but she rarely encounters stairs in her daily routine.  Rest  She sleeps on one pillow and does not wake up short of breath, although she sometimes feels like she cannot breathe when waking up on her back, preferring to sleep on her sides. No heart palpitations, dizziness, or syncope. There is no swelling in her legs, but she  reports swelling in her ankle, which she attributes to a past surgery in 2020.  9190  Her current medications include metoprolol 50 mg twice a day, losartan 100 mg daily, Norvasc 5 mg daily, and Mobic  daily. She is not taking aspirin and is not on any cholesterol medication.  A CT scan of her heart arteries was performed, revealing findings concerning for a blockage in one of the arteries. She recalls being told that her echocardiogram showed her heart was squeezing well.   Objective   Medications - Metoprolol 50 mg twice a day - Losartan 100 mg a day - Norvasc 5 mg a day - Mobic  7.5 mg every day - Not taking Aspirin - Flonase  daily per nostril - Prilosec 40 mg daily - Allopurinol  100 mg daily - As needed meclizine   Studies Reviewed: Aaron Aas   EKG Interpretation Date/Time:  Wednesday Sep 29 2023 13:41:40 EDT Ventricular Rate:  60 PR Interval:  164 QRS Duration:  82 QT Interval:  430 QTC Calculation: 430 R Axis:   -25  Text Interpretation: Normal sinus rhythm Minimal voltage criteria for LVH, may be normal variant ( R in aVL ) Possible Anteroseptal infarct , age undetermined When compared with ECG of 17-Aug-2023 10:04, No significant change was found Confirmed by [name] ([id number]) on 09/29/2023 1:48:10 PM    LABS Lipid panel: Total cholesterol 184 mg/dL, HDL 51 mg/dL, Triglycerides 865 mg/dL, LDL 99 mg/dL (78/4696)  RADIOLOGY CT Coronary Angiography (09/19/2023): LAD-mid 100%.  RCA-mild to moderate stenosis.  LCx- normal.  1. Left Main:  No significant stenosis. FFR = 1.00  2. LAD: FFR unable to be modeled beyond mid vessel. Proximal FFR =0.99, mid FFR = 0.97 3. LCX: No significant stenosis. Proximal FFR = 0.98, Distal FFR =0.97 4. RCA: No significant stenosis. Proximal FFR = 0.99, Mid FFR =0.83, Distal FFR = 0.80   IMPRESSION: 1. No flow modeled beyond mid LAD, concerning for chronic total occlusion. Findings communicated to Dr. Addie Holstein.  From [facility]  ECHO  10/08/2022: EF 55 to 60%.  Mild LVH.    Zio patch monitor March-April 2024: Sinus rhythm at a rate of 43 to 138 bpm, average 67 bpm.  Rare PACs and PVCs.  No arrhythmias.  Risk Assessment/Calculations:         Physical Exam:   VS:  BP 130/72   Pulse 60   Ht 5\' 6"  (1.676 m)   Wt 205 lb (93 kg)   SpO2 90%   BMI 33.09 kg/m    Wt Readings from Last 3 Encounters:  09/29/23 205 lb (93 kg)  08/17/23 206 lb (93.4 kg)  08/12/22 197 lb (89.4 kg)    GEN: Well nourished, well groomed.  In no acute distress; mildly obese. NECK: No JVD; No carotid bruits CARDIAC: Normal S1, S2; RRR, no murmurs, rubs, gallops RESPIRATORY:  Clear to auscultation without rales, wheezing or rhonchi ; nonlabored, good air movement. ABDOMEN: Soft, non-tender, non-distended EXTREMITIES:  No edema; No deformity      ASSESSMENT AND PLAN: .    Problem List Items Addressed This Visit       Cardiology Problems   Essential hypertension (Chronic)   Blood pressure seems to be pretty well-controlled on current dose of Norvasc 5 mg and losartan 100 mg along with Lopressor 50 mg twice daily. -Continue current meds for now.      Relevant Orders   EKG 12-Lead (Completed)   Hyperlipidemia with target low density lipoprotein (LDL) cholesterol less than 55 mg/dL (Chronic)   Not currently on any lipid therapy. Will start rosuvastatin 40 mg daily on discharge from Cath Lab. -Will plan to order fasting lipid panel with labs drawn in the Cath Lab.        Other   Abnormal cardiac CT angiography - Primary   CT scan shows complete occlusion of the left anterior descending artery, mild to moderate stenosis in the right coronary artery, and normal circumflex artery.  Echocardiogram reveals normal cardiac function.  Differential diagnosis includes false positive CT scan or collateral circulation.  Heart catheterization needed for definitive assessment and potential intervention.   - Schedule heart catheterization for October 18, 2023. Explained risks and she agreed to proceed. - Order CBC and renal function tests prior to procedure. - Perform lipid panel on procedure day. - Provide educational materials about heart catheterization.      Back pain   Cyst on fourth and fifth vertebrae Cyst causing back pain, managed with medication. No surgical intervention planned. Discussed accommodating back pain during heart catheterization. - Ensure staff adjusts positioning during heart catheterization.      DOE (dyspnea on exertion)   Her major symptom is exertional dyspnea concerned this could be her anginal equivalent especially given the potential significant CAD with occluded LAD showed shown on coronary CTA      Family history of heart disease   Relevant Orders   EKG 12-Lead (Completed)   Obesity (BMI 30.0-34.9) (Chronic)   Discussed importance of weight loss.  Hopefully once  were able to potentially help her exertional dyspnea with ischemic evaluation and potentially PCI of the LAD if an option, we can get her back exercising. Low threshold to consider GLP-1 agonist.      Precordial pain   Does not sound like she been having the chest discomfort as much congestive and exertional dyspnea.  Chest comfort not as prominent but it is exacerbated by exertion.  Concern for this being her anginal equivalent based on results of Coronary CTA.      Other Visit Diagnoses       Pre-procedure lab exam       Relevant Orders   CBC (Completed)   Basic metabolic panel with GFR (Completed)          Informed Consent   Shared Decision Making/Informed Consent The risks [stroke (1 in 1000), death (1 in 1000), kidney failure [usually temporary] (1 in 500), bleeding (1 in 200), allergic reaction [possibly serious] (1 in 200)], benefits (diagnostic support and management of coronary artery disease) and alternatives of a cardiac catheterization were discussed in detail with Ms. Parkin and she is willing to proceed. => Tentatively  scheduled for a Cardiac Catheterization on Monday, June 2 with Dr. [name].     Follow-Up: Return in about 3 weeks (around 10/20/2023) for Post-cath Followup with me, Post cath visit with APP.  Recording duration: 34 minutes Total time spent: 34 min spent with patient + 28 min spent charting = 62 min I spent 56 minutes in the care of Morgan Wolfe today including reviewing outside labs from [facility] (1 minute), reviewing studies (results.  Prior Echo but new Coronary CTA reviewed reviewed -9 minutes.), face to face time discussing treatment options (34 minutes), reviewing records from previous visit (4 minutes), 14 minutes dictating, and documenting in the encounter.     Signed, Arleen Lacer, MD, MS [name], M.D., M.S. Interventional Chartered certified accountant  Pager # [phone]

## 2023-09-30 ENCOUNTER — Ambulatory Visit: Payer: Self-pay | Admitting: Cardiology

## 2023-09-30 DIAGNOSIS — M549 Dorsalgia, unspecified: Secondary | ICD-10-CM | POA: Insufficient documentation

## 2023-09-30 LAB — CBC
Hematocrit: 36.8 % (ref 34.0–46.6)
Hemoglobin: 12.2 g/dL (ref 11.1–15.9)
MCH: 27.8 pg (ref 26.6–33.0)
MCHC: 33.2 g/dL (ref 31.5–35.7)
MCV: 84 fL (ref 79–97)
Platelets: 311 10*3/uL (ref 150–450)
RBC: 4.39 x10E6/uL (ref 3.77–5.28)
RDW: 13.6 % (ref 11.7–15.4)
WBC: 9.7 10*3/uL (ref 3.4–10.8)

## 2023-09-30 LAB — BASIC METABOLIC PANEL WITH GFR
BUN/Creatinine Ratio: 13 (ref 12–28)
BUN: 12 mg/dL (ref 8–27)
CO2: 23 mmol/L (ref 20–29)
Calcium: 9.7 mg/dL (ref 8.7–10.3)
Chloride: 103 mmol/L (ref 96–106)
Creatinine, Ser: 0.89 mg/dL (ref 0.57–1.00)
Glucose: 120 mg/dL — ABNORMAL HIGH (ref 70–99)
Potassium: 4 mmol/L (ref 3.5–5.2)
Sodium: 143 mmol/L (ref 134–144)
eGFR: 72 mL/min/{1.73_m2} (ref >59)

## 2023-09-30 NOTE — Assessment & Plan Note (Signed)
 Does not sound like she been having the chest discomfort as much congestive and exertional dyspnea.  Chest comfort not as prominent but it is exacerbated by exertion.  Concern for this being her anginal equivalent based on results of Coronary CTA.

## 2023-09-30 NOTE — Assessment & Plan Note (Signed)
 Cyst on fourth and fifth vertebrae Cyst causing back pain, managed with medication. No surgical intervention planned. Discussed accommodating back pain during heart catheterization. - Ensure staff adjusts positioning during heart catheterization.

## 2023-09-30 NOTE — Assessment & Plan Note (Signed)
 Blood pressure seems to be pretty well-controlled on current dose of Norvasc 5 mg and losartan 100 mg along with Lopressor 50 mg twice daily. -Continue current meds for now.

## 2023-09-30 NOTE — Assessment & Plan Note (Signed)
 Her major symptom is exertional dyspnea concerned this could be her anginal equivalent especially given the potential significant CAD with occluded LAD showed shown on coronary CTA

## 2023-09-30 NOTE — Assessment & Plan Note (Signed)
 CT scan shows complete occlusion of the left anterior descending artery, mild to moderate stenosis in the right coronary artery, and normal circumflex artery.  Echocardiogram reveals normal cardiac function.  Differential diagnosis includes false positive CT scan or collateral circulation.  Heart catheterization needed for definitive assessment and potential intervention.   - Schedule heart catheterization for October 18, 2023. Explained risks and she agreed to proceed. - Order CBC and renal function tests prior to procedure. - Perform lipid panel on procedure day. - Provide educational materials about heart catheterization.

## 2023-09-30 NOTE — Assessment & Plan Note (Signed)
 Not currently on any lipid therapy. Will start rosuvastatin 40 mg daily on discharge from Cath Lab. -Will plan to order fasting lipid panel with labs drawn in the Cath Lab.

## 2023-09-30 NOTE — Assessment & Plan Note (Signed)
 Discussed importance of weight loss.  Hopefully once were able to potentially help her exertional dyspnea with ischemic evaluation and potentially PCI of the LAD if an option, we can get her back exercising. Low threshold to consider GLP-1 agonist.

## 2023-10-04 ENCOUNTER — Other Ambulatory Visit: Payer: Self-pay | Admitting: *Deleted

## 2023-10-04 NOTE — Progress Notes (Signed)
 Open error

## 2023-10-04 NOTE — Progress Notes (Signed)
 Open in error

## 2023-10-05 NOTE — Telephone Encounter (Signed)
-----   Message from [name] sent at 09/30/2023  7:19 PM EDT ----- Panel looks stable.  Glucose a little elevated but this is not fasting.  Kidney function stable with a creatinine of 0.89.  Blood counts are stable with a hemoglobin of 12.2.  She would be okay for heart catheterization.  Gentle oral hydration prior to procedure.  [name], MD

## 2023-10-05 NOTE — Telephone Encounter (Signed)
 The patient has been notified of the result and verbalized understanding.  All questions (if any) were answered.   Patient is aware  to drink 1 bottle of water prior to her coming to hospital on 10/20/23 for cath .  She also ,is awre  to expect another call from our procedural navigator Bebe Bourdon, RN 10/05/2023 10:33 AM

## 2023-10-18 ENCOUNTER — Telehealth: Payer: Self-pay | Admitting: *Deleted

## 2023-10-18 NOTE — Telephone Encounter (Signed)
 Cardiac Catheterization scheduled at [facility] for: Wednesday October 20, 2023 8:30 AM Arrival time [facility] Main Entrance A at: 6:30 AM  Nothing to eat after midnight prior to procedure, clear liquids until 5 AM day of procedure.  Medication instructions: -Usual morning medications can be taken with sips of water including aspirin 81 mg.  Plan to go home the same day, you will only stay overnight if medically necessary.  You must have responsible adult to drive you home.  Someone must be with you the first 24 hours after you arrive home.  Reviewed procedure instructions with patient, reminded patient to stay well hydrated today.

## 2023-10-20 ENCOUNTER — Other Ambulatory Visit (HOSPITAL_COMMUNITY): Payer: Self-pay

## 2023-10-20 ENCOUNTER — Encounter (HOSPITAL_COMMUNITY)
Admission: RE | Disposition: A | Discharge status: Home / Self Care | Payer: Self-pay | Source: Home / Self Care | Attending: Cardiology

## 2023-10-20 ENCOUNTER — Encounter (HOSPITAL_COMMUNITY): Payer: Self-pay | Admitting: Cardiology

## 2023-10-20 ENCOUNTER — Ambulatory Visit (HOSPITAL_COMMUNITY)
Admission: RE | Admit: 2023-10-20 | Discharge: 2023-10-20 | Disposition: A | Discharge status: Home / Self Care | Attending: Cardiology | Admitting: Cardiology

## 2023-10-20 ENCOUNTER — Other Ambulatory Visit: Payer: Self-pay

## 2023-10-20 DIAGNOSIS — Z8249 Family history of ischemic heart disease and other diseases of the circulatory system: Secondary | ICD-10-CM | POA: Diagnosis not present

## 2023-10-20 DIAGNOSIS — I25118 Atherosclerotic heart disease of native coronary artery with other forms of angina pectoris: Secondary | ICD-10-CM | POA: Insufficient documentation

## 2023-10-20 DIAGNOSIS — Z791 Long term (current) use of non-steroidal anti-inflammatories (NSAID): Secondary | ICD-10-CM | POA: Diagnosis not present

## 2023-10-20 DIAGNOSIS — E785 Hyperlipidemia, unspecified: Secondary | ICD-10-CM | POA: Diagnosis present

## 2023-10-20 DIAGNOSIS — R0609 Other forms of dyspnea: Secondary | ICD-10-CM | POA: Diagnosis present

## 2023-10-20 DIAGNOSIS — R931 Abnormal findings on diagnostic imaging of heart and coronary circulation: Secondary | ICD-10-CM | POA: Insufficient documentation

## 2023-10-20 DIAGNOSIS — I25119 Atherosclerotic heart disease of native coronary artery with unspecified angina pectoris: Secondary | ICD-10-CM | POA: Insufficient documentation

## 2023-10-20 DIAGNOSIS — J3089 Other allergic rhinitis: Secondary | ICD-10-CM

## 2023-10-20 DIAGNOSIS — I2510 Atherosclerotic heart disease of native coronary artery without angina pectoris: Secondary | ICD-10-CM | POA: Diagnosis not present

## 2023-10-20 DIAGNOSIS — Z79899 Other long term (current) drug therapy: Secondary | ICD-10-CM | POA: Insufficient documentation

## 2023-10-20 DIAGNOSIS — I2582 Chronic total occlusion of coronary artery: Secondary | ICD-10-CM | POA: Diagnosis not present

## 2023-10-20 DIAGNOSIS — I209 Angina pectoris, unspecified: Secondary | ICD-10-CM | POA: Diagnosis present

## 2023-10-20 DIAGNOSIS — I10 Essential (primary) hypertension: Secondary | ICD-10-CM | POA: Diagnosis present

## 2023-10-20 DIAGNOSIS — Z955 Presence of coronary angioplasty implant and graft: Secondary | ICD-10-CM

## 2023-10-20 HISTORY — PX: LEFT HEART CATH AND CORONARY ANGIOGRAPHY: CATH118249

## 2023-10-20 HISTORY — PX: CORONARY STENT INTERVENTION: CATH118234

## 2023-10-20 HISTORY — PX: CORONARY CTO INTERVENTION: CATH118236

## 2023-10-20 LAB — POCT ACTIVATED CLOTTING TIME
Activated Clotting Time: 302 s
Activated Clotting Time: 279 s
Activated Clotting Time: 343 s

## 2023-10-20 SURGERY — LEFT HEART CATH AND CORONARY ANGIOGRAPHY
Anesthesia: LOCAL

## 2023-10-20 MED ORDER — HEPARIN SODIUM (PORCINE) 1000 UNIT/ML IJ SOLN
INTRAMUSCULAR | Status: DC | PRN
  Administered 2023-10-20 (×2): 5000 [IU] via INTRAVENOUS

## 2023-10-20 MED ORDER — CLOPIDOGREL BISULFATE 300 MG PO TABS
ORAL_TABLET | ORAL | Status: DC | PRN
  Administered 2023-10-20: 600 mg via ORAL

## 2023-10-20 MED ORDER — VERAPAMIL HCL 2.5 MG/ML IV SOLN
INTRAVENOUS | Status: DC | PRN
  Administered 2023-10-20: 10 mL via INTRA_ARTERIAL

## 2023-10-20 MED ORDER — SODIUM CHLORIDE 0.9 % WEIGHT BASED INFUSION: 3.0000 mL/kg/h | INTRAVENOUS | Status: AC

## 2023-10-20 MED ORDER — ACETAMINOPHEN 325 MG PO TABS: 650.0000 mg | ORAL_TABLET | ORAL | Status: DC | PRN

## 2023-10-20 MED ORDER — SODIUM CHLORIDE 0.9% FLUSH: 3.0000 mL | Freq: Two times a day (BID) | INTRAVENOUS | Status: DC

## 2023-10-20 MED ORDER — MIDAZOLAM HCL 2 MG/2ML IJ SOLN
INTRAMUSCULAR | Status: DC | PRN
  Administered 2023-10-20: 1 mg via INTRAVENOUS

## 2023-10-20 MED ORDER — ONDANSETRON HCL 4 MG/2ML IJ SOLN: 4.0000 mg | Freq: Four times a day (QID) | INTRAMUSCULAR | Status: DC | PRN

## 2023-10-20 MED ORDER — FENTANYL CITRATE (PF) 100 MCG/2ML IJ SOLN
INTRAMUSCULAR | Status: AC
  Filled 2023-10-20: qty 2

## 2023-10-20 MED ORDER — PANTOPRAZOLE SODIUM 40 MG PO TBEC: 40.0000 mg | DELAYED_RELEASE_TABLET | Freq: Every day | ORAL | Status: DC

## 2023-10-20 MED ORDER — LABETALOL HCL 5 MG/ML IV SOLN: 10.0000 mg | INTRAVENOUS | Status: DC | PRN

## 2023-10-20 MED ORDER — CLOPIDOGREL BISULFATE 75 MG PO TABS
75.0000 mg | ORAL_TABLET | Freq: Every day | ORAL | 1 refills | Status: DC
  Filled 2023-10-20: qty 90, 90d supply, fill #0

## 2023-10-20 MED ORDER — HYDRALAZINE HCL 20 MG/ML IJ SOLN: 10.0000 mg | INTRAMUSCULAR | Status: DC | PRN

## 2023-10-20 MED ORDER — LIDOCAINE HCL (PF) 1 % IJ SOLN
INTRAMUSCULAR | Status: AC
Start: 2023-10-20 — End: ?
  Filled 2023-10-20: qty 30

## 2023-10-20 MED ORDER — SODIUM CHLORIDE 0.9 % IV SOLN
250.0000 mL | INTRAVENOUS | Status: DC | PRN
Start: 2023-10-20 — End: 2023-10-20

## 2023-10-20 MED ORDER — FENTANYL CITRATE (PF) 100 MCG/2ML IJ SOLN
INTRAMUSCULAR | Status: DC | PRN
  Administered 2023-10-20 (×2): 25 ug via INTRAVENOUS

## 2023-10-20 MED ORDER — VERAPAMIL HCL 2.5 MG/ML IV SOLN
INTRAVENOUS | Status: AC
  Filled 2023-10-20: qty 2

## 2023-10-20 MED ORDER — ROSUVASTATIN CALCIUM 40 MG PO TABS
40.0000 mg | ORAL_TABLET | Freq: Every day | ORAL | 2 refills | Status: DC
  Filled 2023-10-20: qty 30, 30d supply, fill #0

## 2023-10-20 MED ORDER — SODIUM CHLORIDE 0.9% FLUSH
3.0000 mL | INTRAVENOUS | Status: DC | PRN
Start: 2023-10-20 — End: 2023-10-20

## 2023-10-20 MED ORDER — HEPARIN (PORCINE) IN NACL 1000-0.9 UT/500ML-% IV SOLN
INTRAVENOUS | Status: DC | PRN
  Administered 2023-10-20 (×3): 500 mL

## 2023-10-20 MED ORDER — ASPIRIN 81 MG PO CHEW
81.0000 mg | CHEWABLE_TABLET | ORAL | Status: DC
Start: 2023-10-20 — End: 2023-10-20

## 2023-10-20 MED ORDER — SODIUM CHLORIDE 0.9 % IV SOLN
INTRAVENOUS | Status: DC | PRN
  Administered 2023-10-20: 10 mL/h via INTRAVENOUS

## 2023-10-20 MED ORDER — CLOPIDOGREL BISULFATE 75 MG PO TABS: 75.0000 mg | ORAL_TABLET | Freq: Every day | ORAL | Status: DC

## 2023-10-20 MED ORDER — SODIUM CHLORIDE 0.9 % IV SOLN: INTRAVENOUS | Status: DC

## 2023-10-20 MED ORDER — ASPIRIN 81 MG PO CHEW
81.0000 mg | CHEWABLE_TABLET | Freq: Every day | ORAL | 1 refills | Status: AC
  Filled 2023-10-20: qty 90, 90d supply, fill #0

## 2023-10-20 MED ORDER — SODIUM CHLORIDE 0.9 % WEIGHT BASED INFUSION
1.0000 mL/kg/h | INTRAVENOUS | Status: DC
Start: 2023-10-20 — End: 2023-10-20

## 2023-10-20 MED ORDER — MIDAZOLAM HCL 2 MG/2ML IJ SOLN
INTRAMUSCULAR | Status: AC
  Filled 2023-10-20: qty 2

## 2023-10-20 MED ORDER — LORATADINE 10 MG PO TABS
10.0000 mg | ORAL_TABLET | Freq: Every day | ORAL | 1 refills | Status: AC | PRN
  Filled 2023-10-20: qty 30, 30d supply, fill #0

## 2023-10-20 MED ORDER — IOHEXOL 350 MG/ML SOLN
INTRAVENOUS | Status: DC | PRN
  Administered 2023-10-20: 200 mL

## 2023-10-20 MED ORDER — NITROGLYCERIN 1 MG/10 ML FOR IR/CATH LAB
INTRA_ARTERIAL | Status: DC
Start: 2023-10-20 — End: 2023-10-20
  Filled 2023-10-20: qty 10

## 2023-10-20 MED ORDER — NITROGLYCERIN 1 MG/10 ML FOR IR/CATH LAB
INTRA_ARTERIAL | Status: DC | PRN
  Administered 2023-10-20: 200 ug

## 2023-10-20 MED ORDER — HEPARIN SODIUM (PORCINE) 1000 UNIT/ML IJ SOLN
INTRAMUSCULAR | Status: AC
  Filled 2023-10-20: qty 10

## 2023-10-20 MED ORDER — LIDOCAINE HCL (PF) 1 % IJ SOLN
INTRAMUSCULAR | Status: DC | PRN
  Administered 2023-10-20: 5 mL

## 2023-10-20 SURGICAL SUPPLY — 20 items
BALLOON EMERGE MR 2.5X12 (BALLOONS) IMPLANT
BALLOON EMERGE MR 2.5X20 (BALLOONS) IMPLANT
BALLOON TAKERU 1.5X12 (BALLOONS) IMPLANT
BALLOON ~~LOC~~ EMERGE MR 3.0X20 (BALLOONS) IMPLANT
CATH INFINITI AMBI 5FR TG (CATHETERS) IMPLANT
CATH LAUNCHER 6FR JR4 (CATHETERS) IMPLANT
CATH VISTA GUIDE 6FR XB3.5 EPK (CATHETERS) IMPLANT
COVER PRB 48X5XTLSCP FOLD TPE (BAG) IMPLANT
DEVICE RAD COMP TR BAND LRG (VASCULAR PRODUCTS) IMPLANT
GLIDESHEATH SLEND SS 6F .021 (SHEATH) IMPLANT
GUIDEWIRE INQWIRE 1.5J.035X260 (WIRE) IMPLANT
KIT ENCORE 26 ADVANTAGE (KITS) IMPLANT
KIT SYRINGE INJ CVI SPIKEX1 (MISCELLANEOUS) IMPLANT
PACK CARDIAC CATHETERIZATION (CUSTOM PROCEDURE TRAY) ×2 IMPLANT
SET ATX-X65L (MISCELLANEOUS) IMPLANT
STENT SYNERGY XD 2.50X16 (Permanent Stent) IMPLANT
STENT SYNERGY XD 2.75X38 (Permanent Stent) IMPLANT
TUBING CIL FLEX 10 FLL-RA (TUBING) IMPLANT
WIRE PT2 MS 185 (WIRE) IMPLANT
WIRE RUNTHROUGH IZANAI 014 180 (WIRE) IMPLANT

## 2023-10-20 NOTE — Interval H&P Note (Signed)
 History and Physical Interval Note:  10/20/2023 8:02 AM  Morgan Wolfe  has presented today for surgery, with the diagnosis of abnormal cta /DOE (Class III angina).  The various methods of treatment have been discussed with the patient and family. After consideration of risks, benefits and other options for treatment, the patient has consented to  Procedure(s): LEFT HEART CATH AND CORONARY ANGIOGRAPHY (N/A)  PERCUTANEOUS CORONARY INTERVENTION  as a surgical intervention.  The patient's history has been reviewed, patient examined, no change in status, stable for surgery.  I have reviewed the patient's chart and labs.  Questions were answered to the patient's satisfaction.    Cath Lab Visit (complete for each Cath Lab visit)  Clinical Evaluation Leading to the Procedure:   ACS: No.  Non-ACS:    Anginal Classification: CCS III  Anti-ischemic medical therapy: Maximal Therapy (2 or more classes of medications)  Non-Invasive Test Results: High-risk stress test findings: cardiac mortality >3%/year  Prior CABG: No previous CABG    [name]

## 2023-10-20 NOTE — Discharge Instructions (Signed)

## 2023-10-20 NOTE — Discharge Summary (Signed)
 Discharge Summary for Same Day PCI   Patient ID: Morgan Wolfe MRN: [id number]; DOB: 1955-09-02  Admit date: 10/20/2023 Discharge date: 10/20/2023  Primary Care Provider: Lorella Roles, MD  Primary Cardiologist: [name], MD  Primary Electrophysiologist:  None   Discharge Diagnoses    Principal Problem:   Coronary artery disease status post coronary stent insertion Active Problems:   Essential hypertension   DOE (dyspnea on exertion)   Hyperlipidemia with target low density lipoprotein (LDL) cholesterol less than 55 mg/dL   Abnormal cardiac CT angiography   Stable angina pectoris due to arteriosclerosis of coronary artery [facility])    Diagnostic Studies/Procedures    Cardiac Catheterization 10/20/2023:    Prox RCA to Mid RCA lesion is 80% stenosed.   Mid LAD-1 lesion is 30% stenosed with 50% stenosed side branch in 2nd Diag.   Mid LAD-3 lesion is 100% stenosed.   Mid LAD-2 lesion is 99% stenosed The patient received DES x 2 to the LAD and RCA _____________   History of Present Illness     Morgan Wolfe is a 68 y.o. female with past medical history of hypertension, hyperlipidemia. The patient was initially seen for exertional dyspnea.  Patient reported being told that her recent echo was "normal." She underwent a coronary CTA on 08/31/2023 that was concerning for a chronic total occlusion in the mid LAD.   Because of the abnormal coronary CTA as cardiac catheterization was arranged on 10/20/23 for further evaluation.  Hospital Course     The patient underwent cardiac cath as noted above with Dr. Addie Holstein. Plan for DAPT with ASA/Plavix 75 mg for at least 6 months. The patient was seen by cardiac rehab while in short stay. There were no observed complications post cath. Right Radial cath site was re-evaluated prior to discharge and found to be stable without any complications. Instructions/precautions regarding cath site care were given prior to discharge.  Morgan Wolfe  was seen by Dr. Dr. Addie Holstein and determined stable for discharge home. Follow up with our office has been arranged. Medications are listed below. Pertinent changes include. Start aspirin 81mg  daily Start Plavix 75mg  daily Increase Crestor to 40mg  daily. Check LFT's at follow up and will need lipid panel in 8 weeks Stop omeprazole Start Protonix 40mg  for GERD     _____________  Cath/PCI Registry Performance & Quality Measures: Aspirin prescribed? - Yes ADP Receptor Inhibitor (Plavix/Clopidogrel, Brilinta/Ticagrelor or Effient/Prasugrel) prescribed (includes medically managed patients)? - Yes High Intensity Statin (Lipitor 40-80mg  or Crestor 20-40mg ) prescribed? - Yes For EF <40%, was ACEI/ARB prescribed? - Not Applicable (EF >/= 40%) For EF <40%, Aldosterone Antagonist (Spironolactone  or Eplerenone) prescribed? - Not Applicable (EF >/= 40%) Cardiac Rehab Phase II ordered (Included Medically managed Patients)? - Yes  _____________   Discharge Vitals Blood pressure (!) 140/69, pulse 65, temperature 98.2 F (36.8 C), temperature source Oral, resp. rate 14, height 5\' 6"  (1.676 m), weight 94.8 kg, SpO2 97%.  Filed Weights   10/20/23 0626  Weight: 94.8 kg    Last Labs & Radiologic Studies    CBC No results for input(s): "WBC", "NEUTROABS", "HGB", "HCT", "MCV", "PLT" in the last 72 hours. Basic Metabolic Panel No results for input(s): "NA", "K", "CL", "CO2", "GLUCOSE", "BUN", "CREATININE", "CALCIUM", "MG", "PHOS" in the last 72 hours. Liver Function Tests No results for input(s): "AST", "ALT", "ALKPHOS", "BILITOT", "PROT", "ALBUMIN" in the last 72 hours. No results for input(s): "LIPASE", "AMYLASE" in the last 72 hours. High Sensitivity Troponin:  No results for input(s): "TROPONINIHS" in the last 720 hours.  BNP Invalid input(s): "POCBNP" D-Dimer No results for input(s): "DDIMER" in the last 72 hours. Hemoglobin A1C No results for input(s): "HGBA1C" in the last 72  hours. Fasting Lipid Panel No results for input(s): "CHOL", "HDL", "LDLCALC", "TRIG", "CHOLHDL", "LDLDIRECT" in the last 72 hours. Thyroid  Function Tests No results for input(s): "TSH", "T4TOTAL", "T3FREE", "THYROIDAB" in the last 72 hours.  Invalid input(s): "FREET3" _____________  CARDIAC CATHETERIZATION Result Date: 10/20/2023 Images from the original result were not included.   Mid LAD-1 lesion is 30% stenosed with 50% stenosed side branch in 2nd Diag.   Lesion Segment #1: Mid LAD-2 lesion is 99% stenosed. Mid LAD-3 lesion is 100% stenosed.  (CTO-TIMI 0 flow)   A drug-eluting stent was successfully placed using a STENT SYNERGY XD 2.75X38 postdilated and taper fashion from 3.1 to 2.9 mm; Post intervention, there is a 0% residual stenosis throughout the stented segment and side branches.  TIMI-3 flow restored.   Dist LAD lesion is 40% stenosed.   --------------------------------   Lesion #2 prox RCA to Mid RCA lesion is 80% stenosed.   A drug-eluting stent was successfully placed using a STENT SYNERGY XD 2.50X16 -deployed to 2.75 mm; Post intervention, there is a 0% residual stenosis.  TIMI-3 flow maintained   --------------------------------   Normal LCx   LV end diastolic pressure is normal.  There is no aortic valve stenosis.   -------------------------------- Diagnostic Dominance: Co-dominant    Intervention POST-OPERATIVE DIAGNOSIS:  Severe two-vessel CAD: 99% followed by 100% mid LAD CTO between D2 and D3 at SP2 -> successful CTO PCI using Synergy XD 2.75 mm x 38 mm postdilated and taper fashion from 3.1 to 2.9 mm 80% proximal to mid RCA (that provides significant collaterals to the LAD)-> successful DES PCI with Synergy XD 2.5 mm x 16 mm deployed to 2.75 mm: Widely patent codominant LCx with tiny OM1 and OM 2 large OM 3 and small OM 4 as well as 2 LPL branches. Normal LVEDP (LV gram not performed due to significant ectopy) RECOMMENDATIONS   In the absence of any other complications or medical  issues, we expect the patient to be ready for discharge from an interventional cardiology perspective on 10/20/2023.   Discharge to home after PACU - 8 hrs; follow-up as scheduled with Dr. Addie Holstein for titration of medical management.   Recommend uninterrupted dual antiplatelet therapy with Aspirin 81mg  daily and Clopidogrel 75mg  daily for a minimum of 6 months (stable ischemic heart disease-Class I recommendation).   Plan for long-term Plavix monotherapy after 6 months of DAPT ASA/Plavix 75 mg daily [name], MD    Disposition   Pt is being discharged home today in good condition.  Follow-up Plans & Appointments     Follow-up Information     Jude Norton, NP Follow up.   Specialties: Cardiology, Family Medicine Why: Cardiology follow up with Marlana Silvan NP 11/09/23 at 8 am. Please arrive 15 minutes early. Marlana Silvan is a member of the Heart Care team at [facility]. Contact information: [address] [place] [address] [phone]                Discharge Instructions     AMB Referral to Cardiac Rehabilitation - Phase II   Complete by: As directed    Diagnosis:  Stable Angina Coronary Stents     After initial evaluation and assessments completed: Virtual Based Care may be provided alone or in conjunction with  Phase 2 Cardiac Rehab based on patient barriers.: Yes   Intensive Cardiac Rehabilitation (ICR) [facility] location only OR Traditional Cardiac Rehabilitation (TCR) *If criteria for ICR are not met will enroll in TCR [facility] only): Yes        Discharge Medications   Allergies as of 10/20/2023       Reactions   Augmentin  [amoxicillin -pot Clavulanate] Nausea And Vomiting   Diarrhea   Sulfonamide Derivatives Itching        Medication List     STOP taking these medications    meclizine  25 MG tablet Commonly known as: ANTIVERT    meloxicam  7.5 MG tablet Commonly known as: Mobic    omeprazole 40 MG capsule Commonly known as: PRILOSEC       TAKE  these medications    allopurinol  100 MG tablet Commonly known as: ZYLOPRIM  Take 1 tablet (100 mg total) by mouth daily. What changed:  when to take this reasons to take this   amLODipine 5 MG tablet Commonly known as: NORVASC Take 5 mg by mouth daily.   aspirin 81 MG chewable tablet Chew 1 tablet (81 mg total) by mouth daily.   cholecalciferol 25 MCG (1000 UNIT) tablet Commonly known as: VITAMIN D3 Take 1,000 Units by mouth daily.   clopidogrel 75 MG tablet Commonly known as: PLAVIX Take 1 tablet (75 mg total) by mouth daily with breakfast. Start taking on: October 21, 2023   fluticasone  50 MCG/ACT nasal spray Commonly known as: FLONASE  SHAKE LIQUID AND USE 2 SPRAYS IN EACH NOSTRIL DAILY   loratadine  10 MG tablet Commonly known as: CLARITIN  Take 1 tablet (10 mg total) by mouth daily as needed for allergies.   losartan 100 MG tablet Commonly known as: COZAAR Take 100 mg by mouth every morning.   metoprolol tartrate 50 MG tablet Commonly known as: LOPRESSOR Take 50 mg by mouth 2 (two) times daily.   rosuvastatin 40 MG tablet Commonly known as: CRESTOR Take 1 tablet (40 mg total) by mouth daily. What changed:  medication strength how much to take           Allergies Allergies  Allergen Reactions   Augmentin  [Amoxicillin -Pot Clavulanate] Nausea And Vomiting    Diarrhea    Sulfonamide Derivatives Itching    Outstanding Labs/Studies   Needs outpatient LFT's checked. Needs lipid panel in 8 weeks.  Duration of Discharge Encounter   Greater than 30 minutes including physician time.  Signed, Melita Springer, PA-C 10/20/2023, 2:13 PM

## 2023-10-20 NOTE — Brief Op Note (Signed)
 BRIEF CARDIAC CATHETERIZATION-PCI NOTE   10/20/2023 10:24 AM  ID:  Morgan Wolfe, DOB [birth year], MRN [id number] PCP: Lorella Roles, MD  [facility] Providers Cardiologist:  [name], MD     PATIENT:  Morgan Wolfe  68 y.o. female with PMH of HTN, HLD evaluated at the request of Dr. Rito Chess for progressive exertional dyspnea with normal echocardiogram.  She underwent coronary CTA which revealed likely CTO of the LAD as well as CT FFR significant proximal to mid RCA lesion.  Based on her progressive exertional symptoms concerning for at least class III angina, she was referred for cardiac catheterization and possible PCI.  PRE-OPERATIVE DIAGNOSIS:  abnormal cta; exertional dyspnea-class III angina  POST-OPERATIVE DIAGNOSIS:   Severe two-vessel CAD: 99% followed by 100% mid LAD CTO between D2 and D3 at SP2 -> successful CTO PCI using Synergy XD 2.75 mm x 38 mm postdilated and taper fashion from 3.1 to 2.9 mm 80% proximal to mid RCA (that provides significant collaterals to the LAD)-> successful DES PCI with Synergy XD 2.5 mm x 16 mm deployed to 2.75 mm: Widely patent codominant LCx with tiny OM1 and OM 2 large OM 3 and small OM 4 as well as 2 LPL branches. Normal LVEDP (LV gram not performed due to significant ectopy)  PROCEDURE:  Procedure(s): LEFT HEART CATH AND CORONARY ANGIOGRAPHY (N/A) CORONARY STENT INTERVENTION (N/A) CORONARY CTO INTERVENTION (N/A)  SURGEON:  Surgeons and Role:    * Arleen Lacer, MD - Primary  PROCEDURE PERFORMED  Time Out: Verified patient identification, verified procedure, site/side was marked, verified correct patient position, special equipment/implants available, medications/allergies/relevent history reviewed, required imaging and test results available. Performed.  Access:  RITGHT Radial Artery: 6 Fr sheath -- Seldinger technique using Micropuncture Kit -- Direct ultrasound guidance used.  Permanent image obtained and placed on  chart. -- 10 mL radial cocktail IA; 5000 Units IV Heparin  Left Heart Catheterization: 5&6Fr Catheters advanced or exchanged over a J-wire under direct fluoroscopic guidance into the ascending aorta; TIG 4.0 catheter advanced first.  * LV Hemodynamics (no LV Gram due to significant ectopy): TIG 4.0 catheter * Left & Right Coronary Artery Cineangiography: TIG 4.0 catheter   Review of initial angiography revealed: As expected CTO of the LAD with left to left and right to left collaterals filling the entire LAD-favorable appearance for CTO PCI after discussion with Dr. [place].  Also noted 80% lesion in the proximal to mid RCA  Preparations are made for attempted CTO PCI of the LAD and is successful PCI of LAD followed by RCA, if not plan would be referral to Dr. [place] for attempted CTO PCI of the LAD plus minus RCA versus CABG.  Additional 5000's IV heparin administered. After successfully wiring across the lesion and advancing a balloon across the lesion, patient was loaded with 600 mg p.o. Plavix.  Successful CTO DES PCI of mid 99% - 100% LAD performed with Synergy XD 2.75 mm x 38 mm postdilated and taper fashion from 3.1 to 2.9 mm: See FINDINGS 6 French XB 3.5 guide catheter, initial attempt crossing with [place] wire was unsuccessful, therefore a Choice PT 2 wire was advanced across the lesion into the distal LAD (with the initial wire in a septal branch) => initial PTCA with 1.5 mm x 12 mm [place] balloon which led to TIMI-3 flow restoration => 2.0 mm x 20 mm balloon predilation followed by stent placement; stent was postdilated with a 3.0 mm x  20  mm [place] balloon to 16-18 atm (tapering from 3.1 to 2.9 mm)  Successful DES PCI of proximal mid L RCA 80% stenosis with Synergy XD 2.5 mm x 16 mm deployed to 2.75 mm: See FINDINGS 6 [place] JR4 guide, Choice PT wire, 2.5 mm x 12 mm balloon=> 2.5 mm 16mm stent deployed 20 ATM (2.75 mm)  Upon completion of Angiogaphy, the wire was removed  followed by the guide catheter being removed completely out of the body over a wire, without complication.  Radial sheath removed in the Cardiac Catheterization lab with TR Band placed for hemostasis.  TR Band: 1015 Hours; 13 mL air; reverse Barbeau B  MEDICATIONS SQ Lidocaine  3 mL Radial Cocktail: 3 mg Verapmil in 10 mL NS Heparin: Total 10,000 units IC NTG 200 mcg x 1  ANESTHESIA:   local and IV sedation -> 1 mg Versed , 50 mg fentanyl   EBL:  <50 mL  COUNTS:  YES  PATIENT DISPOSITION:  PACU - hemodynamically stable.  DICTATION: .Note written in EPIC  PLAN OF CARE: Discharge to home after PACU; plan for long-term Plavix monotherapy after 6 months of DAPT ASA/Plavix 75 mg daily; follow-up as scheduled with Dr. Addie Holstein for titration of medical management.   [name], MD

## 2023-10-20 NOTE — Progress Notes (Signed)
 CARDIAC REHAB PHASE I     Post stent education including site care, restrictions, risk factors, exercise guidelines, NTG use, antiplatelet therapy importance, heart healthy diet and CRP2 reviewed. All questions and concerns addressed. Will refer to [facility] for CRP2. Plan for home later today.    [id number] Ronny Colas, RN BSN 10/20/2023 2:49 PM

## 2023-10-22 ENCOUNTER — Ambulatory Visit: Admitting: Cardiology

## 2023-10-27 ENCOUNTER — Telehealth (HOSPITAL_COMMUNITY): Payer: Self-pay

## 2023-10-27 NOTE — Telephone Encounter (Signed)
 Pt insurance is active and benefits verified through [facility] Co-pay $25, DED 0/0 met, out of pocket $6,750/$150 met, co-insurance 0%. no pre-authorization required. Passport, 10/27/2023@2 :34, REF# [id number]   How many CR sessions are covered? (ICR)72 Is this a lifetime maximum or an annual maximum? annual Has the member used any of these services to date? no Is there a time limit (weeks/months) on start of program and/or program completion? no     Will contact patient to see if she is interested in the Cardiac Rehab Program. If interested, patient will need to complete follow up appt. Once completed, patient will be contacted for scheduling upon review by the RN Navigator.

## 2023-10-27 NOTE — Telephone Encounter (Signed)
 Attempted to call patient in regards to Cardiac Rehab - LM on VM

## 2023-11-06 ENCOUNTER — Telehealth: Payer: Self-pay | Admitting: Cardiology

## 2023-11-06 NOTE — Telephone Encounter (Signed)
 Outpatient service line:  Received page reporting patient was having dizzy spells.  Called patient's phone number twice.  Called brother's phone number twice.  No answer.

## 2023-11-09 ENCOUNTER — Encounter: Payer: Self-pay | Admitting: Nurse Practitioner

## 2023-11-09 ENCOUNTER — Ambulatory Visit: Attending: Nurse Practitioner | Admitting: Nurse Practitioner

## 2023-11-09 VITALS — BP 124/70 | HR 60 | Ht 66.0 in | Wt 200.8 lb

## 2023-11-09 DIAGNOSIS — I2510 Atherosclerotic heart disease of native coronary artery without angina pectoris: Secondary | ICD-10-CM | POA: Diagnosis not present

## 2023-11-09 DIAGNOSIS — I10 Essential (primary) hypertension: Secondary | ICD-10-CM | POA: Diagnosis not present

## 2023-11-09 DIAGNOSIS — E785 Hyperlipidemia, unspecified: Secondary | ICD-10-CM

## 2023-11-09 DIAGNOSIS — Z955 Presence of coronary angioplasty implant and graft: Secondary | ICD-10-CM | POA: Diagnosis not present

## 2023-11-09 MED ORDER — ROSUVASTATIN CALCIUM 40 MG PO TABS
40.0000 mg | ORAL_TABLET | Freq: Every day | ORAL | 3 refills | Status: AC
Start: 2023-11-09 — End: ?

## 2023-11-09 MED ORDER — AMLODIPINE BESYLATE 5 MG PO TABS: 5.0000 mg | ORAL_TABLET | Freq: Every day | ORAL | 3 refills | Status: AC

## 2023-11-09 MED ORDER — CLOPIDOGREL BISULFATE 75 MG PO TABS
75.0000 mg | ORAL_TABLET | Freq: Every day | ORAL | 3 refills | Status: AC
Start: 2023-11-09 — End: ?

## 2023-11-09 MED ORDER — LOSARTAN POTASSIUM 100 MG PO TABS: 100.0000 mg | ORAL_TABLET | Freq: Every morning | ORAL | 3 refills | Status: AC

## 2023-11-09 MED ORDER — METOPROLOL TARTRATE 50 MG PO TABS: 50.0000 mg | ORAL_TABLET | Freq: Two times a day (BID) | ORAL | 3 refills | Status: AC

## 2023-11-09 NOTE — Progress Notes (Signed)
 Office Visit    Patient Name: Morgan Wolfe Date of Encounter: 11/09/2023  Primary Care Provider:  Haze Kingfisher, MD Primary Cardiologist:  Alm Clay, MD  Chief Complaint    68 year old female with a history of CAD s/p DES-p-mRCA, DES-CTO-mLAD in 10/2023, hypertension, and hyperlipidemia who presents for hospital follow-up related to CAD s/p DES x 2.  Past Medical History    Past Medical History:  Diagnosis Date   ALLERGIC RHINITIS    Hypertension    PONV (postoperative nausea and vomiting)    Right shoulder pain    Past Surgical History:  Procedure Laterality Date   ANKLE ARTHROSCOPY Left 01/03/2019   Procedure: LEFT ANKLE ARTHROSCOPY AND DEBRIDEMENT;  Surgeon: Harden Jerona GAILS, MD;  Location: [facility];  Service: Orthopedics;  Laterality: Left;   ANKLE SURGERY Left 2000   CORONARY CTO INTERVENTION N/A 10/20/2023   Procedure: CORONARY CTO INTERVENTION;  Surgeon: Clay Alm ORN, MD;  Location: [facility] INVASIVE CV LAB;  Service: Cardiovascular;  Laterality: N/A;   CORONARY STENT INTERVENTION N/A 10/20/2023   Procedure: CORONARY STENT INTERVENTION;  Surgeon: Clay Alm ORN, MD;  Location: [facility] INVASIVE CV LAB;  Service: Cardiovascular;  Laterality: N/A;   ELBOW ARTHROSCOPY Right 2019   LEFT HEART CATH AND CORONARY ANGIOGRAPHY N/A 10/20/2023   Procedure: LEFT HEART CATH AND CORONARY ANGIOGRAPHY;  Surgeon: Clay Alm ORN, MD;  Location: [facility] INVASIVE CV LAB;  Service: Cardiovascular;  Laterality: N/A;   TOE SURGERY Right 1998   bone spur   VAGINAL HYSTERECTOMY  1980   per Dr. Okey for fibroids; no cancer    Allergies  Allergies  Allergen Reactions   Augmentin  [Amoxicillin -Pot Clavulanate] Nausea And Vomiting    Diarrhea    Sulfonamide Derivatives Itching     Labs/Other Studies Reviewed    The following studies were reviewed today:  Cardiac Studies & Procedures    ______________________________________________________________________________________________ CARDIAC CATHETERIZATION  CARDIAC CATHETERIZATION 10/20/2023  Conclusion Images from the original result were not included.    Mid LAD-1 lesion is 30% stenosed with 50% stenosed side branch in 2nd Diag.   Lesion Segment #1: Mid LAD-2 lesion is 99% stenosed. Mid LAD-3 lesion is 100% stenosed.  (CTO-TIMI 0 flow)   A drug-eluting stent was successfully placed using a STENT SYNERGY XD 2.75X38 postdilated and taper fashion from 3.1 to 2.9 mm; Post intervention, there is a 0% residual stenosis throughout the stented segment and side branches.  TIMI-3 flow restored.   Dist LAD lesion is 40% stenosed.   --------------------------------   Lesion #2 prox RCA to Mid RCA lesion is 80% stenosed.   A drug-eluting stent was successfully placed using a STENT SYNERGY XD 2.50X16 -deployed to 2.75 mm; Post intervention, there is a 0% residual stenosis.  TIMI-3 flow maintained   --------------------------------   Normal LCx   LV end diastolic pressure is normal.  There is no aortic valve stenosis.   --------------------------------  Diagnostic Dominance: Co-dominant    Intervention   POST-OPERATIVE DIAGNOSIS: Severe two-vessel CAD: 99% followed by 100% mid LAD CTO between D2 and D3 at SP2 -> successful CTO PCI using Synergy XD 2.75 mm x 38 mm postdilated and taper fashion from 3.1 to 2.9 mm 80% proximal to mid RCA (that provides significant collaterals to the LAD)-> successful DES PCI with Synergy XD 2.5 mm x 16 mm deployed to 2.75 mm: Widely patent codominant LCx with tiny OM1 and OM 2 large OM 3 and small OM 4 as well as 2 LPL  branches. Normal LVEDP (LV gram not performed due to significant ectopy)   RECOMMENDATIONS   In the absence of any other complications or medical issues, we expect the patient to be ready for discharge from an interventional cardiology perspective on 10/20/2023.   Discharge to home after  PACU - 8 hrs; follow-up as scheduled with Dr. Anner for titration of medical management.   Recommend uninterrupted dual antiplatelet therapy with Aspirin  81mg  daily and Clopidogrel  75mg  daily for a minimum of 6 months (stable ischemic heart disease-Class I recommendation).   Plan for long-term Plavix  monotherapy after 6 months of DAPT ASA/Plavix  75 mg daily    Alm Anner, MD  Findings Coronary Findings Diagnostic  Dominance: Co-dominant  Left Anterior Descending Collaterals Mid LAD filled by collaterals from RPDA.  Collaterals Dist LAD filled by collaterals from 3rd Mrg.  Mid LAD-1 lesion is 30% stenosed with 50% stenosed side branch in 2nd Diag. The lesion is eccentric. The lesion is calcified. Mid LAD-2 lesion is 99% stenosed. Mid LAD-3 lesion is 100% stenosed. The lesion is segmental and chronically occluded. Dist LAD lesion is 40% stenosed.  First Diagonal Branch Vessel is small in size.  First Septal Branch Vessel is small in size.  Second Diagonal Branch Vessel is moderate in size.  Second Septal Branch Vessel is small in size. Collaterals 2nd Sept filled by collaterals from RPDA.  Third Diagonal Branch Vessel is small in size.  Left Circumflex Vessel is large.  First Obtuse Marginal Branch Vessel is small in size.  Second Obtuse Marginal Branch Vessel is small in size.  Third Obtuse Marginal Branch Vessel is large in size.  Right Coronary Artery Vessel was injected. Vessel is moderate in size. There is severe focal disease in the vessel. Prox RCA to Mid RCA lesion is 80% stenosed.  Acute Marginal Branch Vessel is small in size.  Right Ventricular Branch Vessel is small in size.  Right Posterior Descending Artery  Intervention  Mid LAD-2 lesion Stent (Also treats lesions: Mid LAD-3) Lesion length:  36 mm. CATH VISTA GUIDE 6FR XB3.5 EPK guide catheter was inserted. Lesion crossed with guidewire using a WIRE PT2 MS 185. Pre-stent  angioplasty was performed using a BALLOON TAKERU 1.5X12. Maximum pressure:  12 atm. Inflation time:  20 sec.  A second angioplasty balloon was used, using a BALLOON EMERGE MR 2.5X20. Maximum pressure:  12 atm. Inflation time:  20 sec.  A third angioplasty balloon was used, using a BALLOON EMERGE MR 2.5X20. Maximum pressure:  12 atm.  Inflation time:  20 sec. A drug-eluting stent was successfully placed using a STENT SYNERGY XD [id number]. Maximum pressure: 16 atm. Inflation time: 30 sec. Stent strut is well apposed. (Deployed to 2.9 mm) postdilated in tapered fashion from 3.1 to 2.9 mm Post-stent angioplasty was performed using a BALLOON [place] EMERGE MR 3.0X20. Maximum pressure:  18 atm. Inflation time:  20 sec. 2 inflations WIRE RUNTHROUGH IZANAI 014 180 was advanced to the lesion but would not cross, was advanced into a septal perforator at the lesion.  This was used to allow for redirection of the second wire into the lesion.  After the lesion was successfully wired with the workhorse wire, this wire was then advanced down as a buddy wire. Post-Intervention Lesion Assessment The intervention was successful. Pre-interventional TIMI flow is 0. Post-intervention TIMI flow is 3. No complications occurred at this lesion. There is a 0% residual stenosis post intervention.  Mid LAD-3 lesion Stent (Also treats lesions: Mid LAD-2) See details in  Mid LAD-2 lesion. Post-Intervention Lesion Assessment The intervention was successful. Pre-interventional TIMI flow is 0. Post-intervention TIMI flow is 3. Treated lesion length:  38 mm. No complications occurred at this lesion. There is a 0% residual stenosis post intervention.  Prox RCA to Mid RCA lesion Stent Lesion length:  15 mm. CATH LAUNCHER [id number] guide catheter was inserted. Lesion crossed with guidewire using a WIRE PT2 MS 185. Pre-stent angioplasty was performed using a BALLOON EMERGE MR 2.5X12. Maximum pressure:  12 atm. Inflation time:  20 sec. A drug-eluting  stent was successfully placed using a STENT SYNERGY XD 2.50X16. Maximum pressure: 18 atm. Inflation time: 30 sec. Stent strut is well apposed. Deployed at high ATM-2.7 mm. Post-Intervention Lesion Assessment The intervention was successful. Pre-interventional TIMI flow is 3. Post-intervention TIMI flow is 3. Treated lesion length:  16 mm. No complications occurred at this lesion. There is a 0% residual stenosis post intervention.          CT SCANS  CT CORONARY MORPH W/CTA COR W/SCORE 08/31/2023  Addendum 09/19/2023 11:03 PM ADDENDUM REPORT: 09/19/2023 23:00  EXAM: OVER-READ INTERPRETATION  CT CHEST  The following report is an over-read performed by radiologist Dr. Oneil Devonshire of [facility] on 09/19/2023. This over-read does not include interpretation of cardiac or coronary anatomy or pathology. The coronary calcium  score/coronary CTA interpretation by the cardiologist is attached.  COMPARISON:  12/30/2022  FINDINGS: Cardiovascular: There are no significant extracardiac vascular findings.  Mediastinum/Nodes: There are no enlarged lymph nodes within the visualized mediastinum.  Lungs/Pleura: There is no pleural effusion. The visualized lungs appear clear.  Upper abdomen: No significant findings in the visualized upper abdomen.  Musculoskeletal/Chest wall: No chest wall mass or suspicious osseous findings within the visualized chest.  IMPRESSION: No significant extracardiac findings within the visualized chest.   Electronically Signed By: Oneil Devonshire M.D. On: 09/19/2023 23:00  Narrative HISTORY: Chest pain, nonspecific Dyspnea on exertion (DOE)  EXAM: Cardiac/Coronary CT  TECHNIQUE: The patient was scanned on a [employer].  PROTOCOL: A 120 kV prospective scan was triggered in the descending thoracic aorta at 111 HU's. Axial non-contrast 3 mm slices were carried out through the heart. The data set was analyzed on a dedicated  work station and scored using the Agatston method. Gantry rotation speed was 250 msecs and collimation was 0.6 mm. Heart rate was optimized medically and sl NTG was given. The 3D data set was reconstructed in 5% intervals of the 35-75 % of the R-R cycle. Systolic and diastolic phases were analyzed on a dedicated work station using MPR, MIP and VRT modes. The patient received 100mL OMNIPAQUE  IOHEXOL  350 MG/ML SOLN of contrast.  FINDINGS: Coronary calcium  score: The patient's coronary artery calcium  score is 59, which places the patient in the 76th percentile.  Coronary arteries: Normal coronary origins.  Right dominance.  Right Coronary Artery: Normal caliber vessel, gives rise to PDA. Focal area of noncalcified plaque in mid vessel with 50-69% stenosis.  Left Main Coronary Artery: Normal caliber vessel. No significant plaque or stenosis.  Left Anterior Descending Coronary Artery: Normal caliber vessel. Proximal vessel with mixed calcified and noncalcified plaque, with 25-49% stenosis. In the mid vessel, there is a segment with no opacification across multiple R-R intervals, with distal opacification noted. This is concerning for CTO filling from distal right to left collaterals. No clear artifact in the area. Gives rise to small first, normal second diagonal branches.  Left Circumflex Artery: Normal caliber vessel. No significant plaque  or stenosis. Gives rise to small first, small second, large third OM branches.  Aorta: Normal size, 29 mm at the mid ascending aorta (level of the PA bifurcation) measured double oblique. No significant aortic atherosclerosis in visualized sections. No dissection seen in visualized portions of the aorta.  Aortic Valve: No calcifications. Trileaflet.  Other findings:  Normal pulmonary vein drainage into the left atrium.  Normal left atrial appendage without a thrombus.  Normal size of the pulmonary artery.  Normal appearance of the  pericardium.  IMPRESSION: 1. Possible chronic total occlusion, CADRADS = 5. CT FFR will be performed and reported separately. Mid LAD with focal area without opacification, seen across multiple R-R intervals. No clear artifact in the area to explain lack of opacification. Distal LAD is moderately opacified distally, and there appears to be possible right to left collaterals. Distance of non-opacification is 8.7 mm. While there is not a high plaque burden in the area, appearance is consistent with CTO. See images saved in PACS.  2. Coronary calcium  score of 59. This was 76th percentile for age-, sex-, and race- matched controls.  3. Total plaque volume 97 mm3 which is 51st percentile for age- and sex- matched controls (calcified plaque 16 mm3; noncalcified plaque 81 mm3). Total plaque volume is mild.  4. Normal coronary origin with right dominance.  INTERPRETATION:  CAD-RADS 5: Total coronary occlusion (100%). Consider cardiac catheterization or viability assessment. Consider symptom-guided anti-ischemic pharmacotherapy as well as risk factor modification per guideline directed care.  Electronically Signed: By: Shelda Bruckner M.D. On: 08/31/2023 12:48     ______________________________________________________________________________________________     Recent Labs: 09/29/2023: BUN 12; Creatinine, Ser 0.89; Hemoglobin 12.2; Platelets 311; Potassium 4.0; Sodium 143  Recent Lipid Panel    Component Value Date/Time   CHOL 184 10/28/2020 0922   TRIG 166.0 (H) 10/28/2020 0922   TRIG 174 (H) 03/02/2006 1057   HDL 51.50 10/28/2020 0922   CHOLHDL 4 10/28/2020 0922   VLDL 33.2 10/28/2020 0922   LDLCALC 99 10/28/2020 0922   LDLDIRECT 59.0 01/04/2017 1353    History of Present Illness    68 year old female with the above past medical history including CAD s/p DES-p-mRCA, DES-CTO-mLAD in 10/2023, hypertension, and hyperlipidemia.  She established with Dr. Anner in  April 2025 in the setting of chest pain, exertional dyspnea.  Coronary CTA in 08/2023 revealed coronary calcium  score 59 (76 percentile), possible CTO to mid LAD.  She was last in the office on 09/29/2023 and was stable overall from a cardiac standpoint. Cardiac catheterization on 10/20/2023 revealed 99% followed by 100% mid LAD CTO s/p PCI/DES, 80% proximal to mid RCA stenosis s/p DES, with residual 30% mid LAD, 50% D2 stenosis, 40% distal LAD stenosis, normal LCx, normal LVEDP.  She was started on DAPT with aspirin  and Plavix .  She presents today for follow-up.  Since her procedure she has done well from a cardiac standpoint.  She has noted mild lightheadedness with position changes, as well as mild hot flashes.  Symptoms are overall fleeting.  She has not been checking her blood pressure at home.  BP is stable in office today.  She denies any palpitations, dizziness, presyncope, or syncope, denies symptoms concerning for angina.  Overall, she reports feeling well.    Home Medications    Current Outpatient Medications  Medication Sig Dispense Refill   allopurinol  (ZYLOPRIM ) 100 MG tablet Take 1 tablet (100 mg total) by mouth daily. (Patient taking differently: Take 100 mg by mouth daily as needed (  Gout).) 30 tablet 1   amLODipine (NORVASC) 5 MG tablet Take 5 mg by mouth daily.     aspirin  81 MG chewable tablet Chew 1 tablet (81 mg total) by mouth daily. 90 tablet 1   cholecalciferol (VITAMIN D3) 25 MCG (1000 UNIT) tablet Take 1,000 Units by mouth daily.     clopidogrel  (PLAVIX ) 75 MG tablet Take 1 tablet (75 mg total) by mouth daily with breakfast. 90 tablet 1   fluticasone  (FLONASE ) 50 MCG/ACT nasal spray SHAKE LIQUID AND USE 2 SPRAYS IN EACH NOSTRIL DAILY (Patient taking differently: Place 2 sprays into both nostrils daily as needed. SHAKE LIQUID AND USE 2 SPRAYS IN EACH NOSTRIL DAILY) 16 g 5   loratadine  (CLARITIN ) 10 MG tablet Take 1 tablet (10 mg total) by mouth daily as needed for allergies. 30  tablet 1   losartan (COZAAR) 100 MG tablet Take 100 mg by mouth every morning.     metoprolol tartrate (LOPRESSOR) 50 MG tablet Take 50 mg by mouth 2 (two) times daily.     rosuvastatin  (CRESTOR ) 40 MG tablet Take 1 tablet (40 mg total) by mouth daily. 30 tablet 2   No current facility-administered medications for this visit.     Review of Systems   She denies chest pain, palpitations, dyspnea, pnd, orthopnea, n, v, dizziness, syncope, edema, weight gain, or early satiety. All other systems reviewed and are otherwise negative except as noted above.    Cardiac Rehabilitation Eligibility Assessment  The patient is ready to start cardiac rehabilitation from a cardiac standpoint.    Physical Exam    VS:  BP 124/70   Pulse 60   Ht 5' 6 (1.676 m)   Wt 200 lb 12.8 oz (91.1 kg)   SpO2 97%   BMI 32.41 kg/m  GEN: Well nourished, well developed, in no acute distress. HEENT: normal. Neck: Supple, no JVD, carotid bruits, or masses. Cardiac: RRR, no murmurs, rubs, or gallops. No clubbing, cyanosis, edema.  Radials/DP/PT 2+ and equal bilaterally. Right radial cath site without bruising, bleeding, bruit or hematoma. Respiratory:  Respirations regular and unlabored, clear to auscultation bilaterally. GI: Soft, nontender, nondistended, BS + x 4. MS: no deformity or atrophy. Skin: warm and dry, no rash. Neuro:  Strength and sensation are intact. Psych: Normal affect.  Accessory Clinical Findings    ECG personally reviewed by me today - EKG Interpretation Date/Time:  Tuesday November 09 2023 07:56:39 EDT Ventricular Rate:  58 PR Interval:  184 QRS Duration:  84 QT Interval:  316 QTC Calculation: 310 R Axis:   -7  Text Interpretation: Sinus bradycardia Low voltage QRS Nonspecific ST and T wave abnormality When compared with ECG of 20-Oct-2023 10:20, No significant change since last tracing Confirmed by Daneen Perkins ([id number]) on 11/09/2023 8:25:14 AM  - no acute changes.   Lab Results   Component Value Date   WBC 9.7 09/29/2023   HGB 12.2 09/29/2023   HCT 36.8 09/29/2023   MCV 84 09/29/2023   PLT 311 09/29/2023   Lab Results  Component Value Date   CREATININE 0.89 09/29/2023   BUN 12 09/29/2023   NA 143 09/29/2023   K 4.0 09/29/2023   CL 103 09/29/2023   CO2 23 09/29/2023   Lab Results  Component Value Date   ALT 12 05/29/2021   AST 14 (L) 05/29/2021   ALKPHOS 72 05/29/2021   BILITOT 0.5 05/29/2021   Lab Results  Component Value Date   CHOL 184 10/28/2020   HDL 51.50  10/28/2020   LDLCALC 99 10/28/2020   LDLDIRECT 59.0 01/04/2017   TRIG 166.0 (H) 10/28/2020   CHOLHDL 4 10/28/2020    No results found for: HGBA1C  Assessment & Plan    1. CAD:  S/p DES-p-mRCA, DES-CTO-mLAD in 10/2023. Stable with no anginal symptoms. No indication for ischemic evaluation.  Continue aspirin , Plavix , metoprolol, losartan, amlodipine, and Crestor .  2. Hypertension: BP well controlled. She does report some mild orthostatic dizziness, mild hot flashes. Symptoms are fleeting overall.  Denies any palpitations, dizziness, presyncope or syncope. Should symptoms persist, consider de-escalation of antihypertensive regimen. For now, continue to monitor BP and continue current antihypertensive regimen.   3. Hyperlipidemia: LDL was 99 in 2022.  Will repeat fasting lipids, LFTs in 6 to 8 weeks.  Continue Crestor .  4. Disposition: Follow-up in 3 to 4 months.  She dropped off a form today that needs to be completed for insurance purposes.  We will have the form completed and faxed as appropriate.      Damien JAYSON Braver, NP 11/09/2023, 8:28 AM

## 2023-11-09 NOTE — Patient Instructions (Addendum)
 Medication Instructions:  Your physician recommends that you continue on your current medications as directed. Please refer to the Current Medication list given to you today.  *If you need a refill on your cardiac medications before your next appointment, please call your pharmacy*  Lab Work: Fasting Lipid panel & LFTs in 6-8 weeks  Testing/Procedures: NONE ordered at this time of appointment   Follow-Up: At [facility], you and your health needs are our priority.  As part of our continuing mission to provide you with exceptional heart care, our providers are all part of one team.  This team includes your primary Cardiologist (physician) and Advanced Practice Providers or APPs (Physician Assistants and Nurse Practitioners) who all work together to provide you with the care you need, when you need it.  Your next appointment:   3-4 month(s)  Provider:   Alm Clay, MD or Damien Braver, NP          We recommend signing up for the patient portal called MyChart.  Sign up information is provided on this After Visit Summary.  MyChart is used to connect with patients for Virtual Visits (Telemedicine).  Patients are able to view lab/test results, encounter notes, upcoming appointments, etc.  Non-urgent messages can be sent to your provider as well.   To learn more about what you can do with MyChart, go to [email or web address].

## 2023-11-18 ENCOUNTER — Ambulatory Visit
Admission: RE | Admit: 2023-11-18 | Discharge: 2023-11-18 | Disposition: A | Discharge status: Home / Self Care | Source: Ambulatory Visit | Attending: Family Medicine | Admitting: Family Medicine

## 2023-11-18 DIAGNOSIS — Z1231 Encounter for screening mammogram for malignant neoplasm of breast: Secondary | ICD-10-CM

## 2023-11-19 LAB — HEPATIC FUNCTION PANEL
ALT: 17 IU/L (ref 0–32)
AST: 20 IU/L (ref 0–40)
Albumin: 4.2 g/dL (ref 3.9–4.9)
Alkaline Phosphatase: 120 IU/L (ref 44–121)
Bilirubin Total: 0.3 mg/dL (ref 0.0–1.2)
Bilirubin, Direct: 0.14 mg/dL (ref 0.00–0.40)
Total Protein: 7.4 g/dL (ref 6.0–8.5)

## 2023-11-19 LAB — LIPID PANEL
Chol/HDL Ratio: 2.5 ratio (ref 0.0–4.4)
Cholesterol, Total: 118 mg/dL (ref 100–199)
HDL: 48 mg/dL (ref >39)
LDL Chol Calc (NIH): 41 mg/dL (ref 0–99)
Triglycerides: 179 mg/dL — ABNORMAL HIGH (ref 0–149)
VLDL Cholesterol Cal: 29 mg/dL (ref 5–40)

## 2023-11-22 ENCOUNTER — Ambulatory Visit: Payer: Self-pay | Admitting: Nurse Practitioner

## 2023-11-24 ENCOUNTER — Telehealth (HOSPITAL_COMMUNITY): Payer: Self-pay

## 2023-11-24 NOTE — Telephone Encounter (Signed)
 Patient called to get scheduled in the [facility]. Patient will come in for orientation on 7/15 and will attend the 10:15 exercise class. Patient will attend class M & F.  Sent MyChart message.

## 2023-11-30 ENCOUNTER — Encounter (HOSPITAL_COMMUNITY): Payer: Self-pay

## 2023-11-30 ENCOUNTER — Encounter (HOSPITAL_COMMUNITY)
Admission: RE | Admit: 2023-11-30 | Discharge: 2023-11-30 | Disposition: A | Discharge status: Home / Self Care | Source: Ambulatory Visit | Attending: Cardiology | Admitting: Cardiology

## 2023-11-30 VITALS — BP 110/70 | HR 58 | Ht 68.5 in | Wt 203.5 lb

## 2023-11-30 DIAGNOSIS — Z955 Presence of coronary angioplasty implant and graft: Secondary | ICD-10-CM | POA: Insufficient documentation

## 2023-11-30 HISTORY — DX: Atherosclerotic heart disease of native coronary artery without angina pectoris: I25.10

## 2023-11-30 NOTE — Progress Notes (Signed)
 Cardiac Rehab Medication Review by a Nurse  Does the patient  feel that his/her medications are working for him/her?  YES   Has the patient been experiencing any side effects to the medications prescribed?   NO  Does the patient measure his/her own blood pressure or blood glucose at home?  YES   Does the patient have any problems obtaining medications due to transportation or finances?  NO  Understanding of regimen: excellent Understanding of indications: good Potential of compliance: excellent    Nurse comments: Morgan Wolfe is taking her medications as prescribed and has a good understanding of what her medications are for. Morgan Wolfe checks her blood pressures every 2 weeks.Morgan Elpidio Quan RN BSN     Morgan Elpidio Morgan Wolfe 11/30/2023 10:01 AM

## 2023-11-30 NOTE — Progress Notes (Signed)
 Cardiac Individual Treatment Plan  Patient Details  Name: Morgan DESAULNIERS MRN: [id number] Date of Birth: Jan 15, 1956 Referring Provider:   Flowsheet Row INTENSIVE CARDIAC REHAB ORIENT from 11/30/2023 in [facility]  Referring Provider Anner Alm ORN, MD    Initial Encounter Date:  Flowsheet Row INTENSIVE CARDIAC REHAB ORIENT from 11/30/2023 in [facility]  Date 11/30/23    Visit Diagnosis: 10/20/23 S/P DES mRCA, S/P DES CTO mLAD  Patient's Home Medications on Admission:  Current Outpatient Medications:    allopurinol  (ZYLOPRIM ) 100 MG tablet, Take 1 tablet (100 mg total) by mouth daily. (Patient taking differently: Take 100 mg by mouth daily as needed.), Disp: 30 tablet, Rfl: 1   amLODipine  (NORVASC ) 5 MG tablet, Take 1 tablet (5 mg total) by mouth daily., Disp: 90 tablet, Rfl: 3   aspirin  81 MG chewable tablet, Chew 1 tablet (81 mg total) by mouth daily., Disp: 90 tablet, Rfl: 1   cholecalciferol (VITAMIN D3) 25 MCG (1000 UNIT) tablet, Take 1,000 Units by mouth daily., Disp: , Rfl:    clopidogrel  (PLAVIX ) 75 MG tablet, Take 1 tablet (75 mg total) by mouth daily with breakfast., Disp: 90 tablet, Rfl: 3   fluticasone  (FLONASE ) 50 MCG/ACT nasal spray, SHAKE LIQUID AND USE 2 SPRAYS IN EACH NOSTRIL DAILY, Disp: 16 g, Rfl: 5   loratadine  (CLARITIN ) 10 MG tablet, Take 1 tablet (10 mg total) by mouth daily as needed for allergies., Disp: 30 tablet, Rfl: 1   losartan  (COZAAR ) 100 MG tablet, Take 1 tablet (100 mg total) by mouth every morning., Disp: 90 tablet, Rfl: 3   metoprolol  tartrate (LOPRESSOR ) 50 MG tablet, Take 1 tablet (50 mg total) by mouth 2 (two) times daily., Disp: 180 tablet, Rfl: 3   rosuvastatin  (CRESTOR ) 40 MG tablet, Take 1 tablet (40 mg total) by mouth daily., Disp: 90 tablet, Rfl: 3  Past Medical History: Past Medical History:  Diagnosis Date   ALLERGIC  RHINITIS    Coronary artery disease    Hypertension    PONV (postoperative nausea and vomiting)    Right shoulder pain     Tobacco Use: Social History   Tobacco Use  Smoking Status Never  Smokeless Tobacco Never    Labs: Review Flowsheet  More data exists      Latest Ref Rng & Units 02/06/2015 01/04/2017 01/10/2018 10/28/2020 11/18/2023  Labs for ITP Cardiac and Pulmonary Rehab  Cholestrol 100 - 199 mg/dL 820  814  818  815  881   LDL (calc) 0 - 99 mg/dL 84  - 85  99  41   Direct LDL mg/dL - 40.9  - - -  HDL-C >60 mg/dL 38.19  38.59  39.09  48.49  48   Trlycerides 0 - 149 mg/dL 837.9  759.9  825.9  833.9  179     Capillary Blood Glucose: No results found for: GLUCAP   Exercise Target Goals: Exercise Program Goal: Individual exercise prescription set using results from initial 6 min walk test and THRR while considering  patient's activity barriers and safety.   Exercise Prescription Goal: Initial exercise prescription builds to 30-45 minutes a day of aerobic activity, 2-3 days per week.  Home exercise guidelines will be given to patient during program as part of exercise prescription that the participant will acknowledge.  Activity Barriers & Risk Stratification:  Activity Barriers & Cardiac Risk Stratification - 11/30/23 1036  Activity Barriers & Cardiac Risk Stratification   Activity Barriers Other (comment)    Comments Right rotator cuff problem. Left ankle reconstruction.    Cardiac Risk Stratification Low          6 Minute Walk:  6 Minute Walk     Row Name 11/30/23 1052         6 Minute Walk   Phase Initial     Distance 1645 feet     Walk Time 6 minutes     # of Rest Breaks 0     MPH 3.11     METS 3.54     RPE 7     Perceived Dyspnea  0     VO2 Peak 12.4     Symptoms No     Resting HR 58 bpm     Resting BP 110/70     Resting Oxygen Saturation  98 %     Exercise Oxygen Saturation  during 6 min walk 96 %     Max Ex. HR 93 bpm     Max Ex.  BP 130/60     2 Minute Post BP 118/60        Oxygen Initial Assessment:   Oxygen Re-Evaluation:   Oxygen Discharge (Final Oxygen Re-Evaluation):   Initial Exercise Prescription:  Initial Exercise Prescription - 11/30/23 1200       Date of Initial Exercise RX and Referring Provider   Date 11/30/23    Referring Provider Anner Alm ORN, MD    Expected Discharge Date 02/25/24      Treadmill   MPH 3    Grade 0    Minutes 15    METs 3.3      NuStep   Level 2    SPM 85    Minutes 15    METs 2.8      Prescription Details   Frequency (times per week) 3    Duration Progress to 30 minutes of continuous aerobic without signs/symptoms of physical distress      Intensity   THRR 40-80% of Max Heartrate 62-124    Ratings of Perceived Exertion 11-13    Perceived Dyspnea 0-4      Progression   Progression Continue to progress workloads to maintain intensity without signs/symptoms of physical distress.      Resistance Training   Training Prescription Yes    Weight 3 lbs    Reps 10-15          Perform Capillary Blood Glucose checks as needed.  Exercise Prescription Changes:   Exercise Comments:   Exercise Goals and Review:   Exercise Goals     Row Name 11/30/23 1036             Exercise Goals   Increase Physical Activity Yes       Intervention Provide advice, education, support and counseling about physical activity/exercise needs.;Develop an individualized exercise prescription for aerobic and resistive training based on initial evaluation findings, risk stratification, comorbidities and participant's personal goals.       Expected Outcomes Long Term: Exercising regularly at least 3-5 days a week.;Long Term: Add in home exercise to make exercise part of routine and to increase amount of physical activity.;Short Term: Attend rehab on a regular basis to increase amount of physical activity.       Increase Strength and Stamina Yes       Intervention Provide  advice, education, support and counseling about physical activity/exercise needs.;Develop an individualized exercise prescription for  aerobic and resistive training based on initial evaluation findings, risk stratification, comorbidities and participant's personal goals.       Expected Outcomes Short Term: Increase workloads from initial exercise prescription for resistance, speed, and METs.;Short Term: Perform resistance training exercises routinely during rehab and add in resistance training at home;Long Term: Improve cardiorespiratory fitness, muscular endurance and strength as measured by increased METs and functional capacity ( )       Able to understand and use rate of perceived exertion (RPE) scale Yes       Intervention Provide education and explanation on how to use RPE scale       Expected Outcomes Short Term: Able to use RPE daily in rehab to express subjective intensity level;Long Term:  Able to use RPE to guide intensity level when exercising independently       Knowledge and understanding of Target Heart Rate Range (THRR) Yes       Intervention Provide education and explanation of THRR including how the numbers were predicted and where they are located for reference       Expected Outcomes Short Term: Able to state/look up THRR;Long Term: Able to use THRR to govern intensity when exercising independently;Short Term: Able to use daily as guideline for intensity in rehab       Able to check pulse independently Yes       Intervention Provide education and demonstration on how to check pulse in carotid and radial arteries.;Review the importance of being able to check your own pulse for safety during independent exercise       Expected Outcomes Short Term: Able to explain why pulse checking is important during independent exercise;Long Term: Able to check pulse independently and accurately       Understanding of Exercise Prescription Yes       Intervention Provide education, explanation, and  written materials on patient's individual exercise prescription       Expected Outcomes Short Term: Able to explain program exercise prescription;Long Term: Able to explain home exercise prescription to exercise independently          Exercise Goals Re-Evaluation :   Discharge Exercise Prescription (Final Exercise Prescription Changes):   Nutrition:  Target Goals: Understanding of nutrition guidelines, daily intake of sodium 1500mg , cholesterol 200mg , calories 30% from fat and 7% or less from saturated fats, daily to have 5 or more servings of fruits and vegetables.  Biometrics:  Pre Biometrics - 11/30/23 1058       Pre Biometrics   Waist Circumference 39.25 inches    Hip Circumference 46 inches    Waist to Hip Ratio 0.85 %    Triceps Skinfold 44 mm    % Body Fat 43.5 %    Grip Strength 20 kg    Flexibility 17.38 in    Single Leg Stand 20 seconds           Nutrition Therapy Plan and Nutrition Goals:   Nutrition Assessments:  MEDIFICTS Score Key: >=70 Need to make dietary changes  40-70 Heart Healthy Diet <= 40 Therapeutic Level Cholesterol Diet    Picture Your Plate Scores: <59 Unhealthy dietary pattern with much room for improvement. 41-50 Dietary pattern unlikely to meet recommendations for good health and room for improvement. 51-60 More healthful dietary pattern, with some room for improvement.  >60 Healthy dietary pattern, although there may be some specific behaviors that could be improved.    Nutrition Goals Re-Evaluation:   Nutrition Goals Re-Evaluation:   Nutrition Goals Discharge (Final  Nutrition Goals Re-Evaluation):   Psychosocial: Target Goals: Acknowledge presence or absence of significant depression and/or stress, maximize coping skills, provide positive support system. Participant is able to verbalize types and ability to use techniques and skills needed for reducing stress and depression.  Initial Review & Psychosocial Screening:   Initial Psych Review & Screening - 11/30/23 1101       Initial Review   Current issues with Current Stress Concerns    Source of Stress Concerns Chronic Illness    Comments Charlies denies having depression or anxiety. [name] mention having some health concerns regarding her right shoulder and mid lower back      Family Dynamics   Good Support System? Yes   [name] lives alone. Charlies has a nephew she can rely on for support. Charlies has 2 brothers who live in the area   Comments Charlies says she mainly relies on herself      Barriers   Psychosocial barriers to participate in program The patient should benefit from training in stress management and relaxation.      Screening Interventions   Interventions Encouraged to exercise    Expected Outcomes Long Term Goal: Stressors or current issues are controlled or eliminated.          Quality of Life Scores:  Quality of Life - 11/30/23 1236       Quality of Life   Select Quality of Life      Quality of Life Scores   Health/Function Pre 28.8 %    Socioeconomic Pre 29.64 %    Psych/Spiritual Pre 30 %    Family Pre 20.4 %    GLOBAL Pre 27.99 %         Scores of 19 and below usually indicate a poorer quality of life in these areas.  A difference of  2-3 points is a clinically meaningful difference.  A difference of 2-3 points in the total score of the Quality of Life Index has been associated with significant improvement in overall quality of life, self-image, physical symptoms, and general health in studies assessing change in quality of life.  PHQ-9: Review Flowsheet  More data exists      11/30/2023 07/10/2022 07/15/2021 04/30/2021 11/22/2019  Depression screen PHQ 2/9  Decreased Interest 0 0 3 0 0  Down, Depressed, Hopeless 0 0 0 0 0  PHQ - 2 Score 0 0 3 0 0  Altered sleeping 0 - 3 3 -  Tired, decreased energy 0 - 2 0 -  Change in appetite 0 - 0 0 -  Feeling bad or failure about yourself  0 - 0 0 -  Trouble concentrating 0 - 0 0 -   Moving slowly or fidgety/restless 0 - 3 0 -  Suicidal thoughts 0 - 0 0 -  PHQ-9 Score 0 - 11 3 -  Difficult doing work/chores Not difficult at all - - - -   Interpretation of Total Score  Total Score Depression Severity:  1-4 = Minimal depression, 5-9 = Mild depression, 10-14 = Moderate depression, 15-19 = Moderately severe depression, 20-27 = Severe depression   Psychosocial Evaluation and Intervention:   Psychosocial Re-Evaluation:   Psychosocial Discharge (Final Psychosocial Re-Evaluation):   Vocational Rehabilitation: Provide vocational rehab assistance to qualifying candidates.   Vocational Rehab Evaluation & Intervention:  Vocational Rehab - 11/30/23 1104       Initial Vocational Rehab Evaluation & Intervention   Assessment shows need for Vocational Rehabilitation No   Charlies has been retired  since 2020 and does not need vocational rehab at this time         Education: Education Goals: Education classes will be provided on a weekly basis, covering required topics. Participant will state understanding/return demonstration of topics presented.     Core Videos: Exercise    Move It!  Clinical staff conducted group or individual video education with verbal and written material and guidebook.  Patient learns the recommended Pritikin exercise program. Exercise with the goal of living a long, healthy life. Some of the health benefits of exercise include controlled diabetes, healthier blood pressure levels, improved cholesterol levels, improved heart and lung capacity, improved sleep, and better body composition. Everyone should speak with their doctor before starting or changing an exercise routine.  Biomechanical Limitations Clinical staff conducted group or individual video education with verbal and written material and guidebook.  Patient learns how biomechanical limitations can impact exercise and how we can mitigate and possibly overcome limitations to have an  impactful and balanced exercise routine.  Body Composition Clinical staff conducted group or individual video education with verbal and written material and guidebook.  Patient learns that body composition (ratio of muscle mass to fat mass) is a key component to assessing overall fitness, rather than body weight alone. Increased fat mass, especially visceral belly fat, can put us  at increased risk for metabolic syndrome, type 2 diabetes, heart disease, and even death. It is recommended to combine diet and exercise (cardiovascular and resistance training) to improve your body composition. Seek guidance from your physician and exercise physiologist before implementing an exercise routine.  Exercise Action Plan Clinical staff conducted group or individual video education with verbal and written material and guidebook.  Patient learns the recommended strategies to achieve and enjoy long-term exercise adherence, including variety, self-motivation, self-efficacy, and positive decision making. Benefits of exercise include fitness, good health, weight management, more energy, better sleep, less stress, and overall well-being.  Medical   Heart Disease Risk Reduction Clinical staff conducted group or individual video education with verbal and written material and guidebook.  Patient learns our heart is our most vital organ as it circulates oxygen, nutrients, white blood cells, and hormones throughout the entire body, and carries waste away. Data supports a plant-based eating plan like the Pritikin Program for its effectiveness in slowing progression of and reversing heart disease. The video provides a number of recommendations to address heart disease.   Metabolic Syndrome and Belly Fat  Clinical staff conducted group or individual video education with verbal and written material and guidebook.  Patient learns what metabolic syndrome is, how it leads to heart disease, and how one can reverse it and keep it  from coming back. You have metabolic syndrome if you have 3 of the following 5 criteria: abdominal obesity, high blood pressure, high triglycerides, low HDL cholesterol, and high blood sugar.  Hypertension and Heart Disease Clinical staff conducted group or individual video education with verbal and written material and guidebook.  Patient learns that high blood pressure, or hypertension, is very common in the [place] . Hypertension is largely due to excessive salt intake, but other important risk factors include being overweight, physical inactivity, drinking too much alcohol, smoking, and not eating enough potassium from fruits and vegetables. High blood pressure is a leading risk factor for heart attack, stroke, congestive heart failure, dementia, kidney failure, and premature death. Long-term effects of excessive salt intake include stiffening of the arteries and thickening of heart muscle and organ damage. Recommendations include ways  to reduce hypertension and the risk of heart disease.  Diseases of Our Time - Focusing on Diabetes Clinical staff conducted group or individual video education with verbal and written material and guidebook.  Patient learns why the best way to stop diseases of our time is prevention, through food and other lifestyle changes. Medicine (such as prescription pills and surgeries) is often only a Band-Aid on the problem, not a long-term solution. Most common diseases of our time include obesity, type 2 diabetes, hypertension, heart disease, and cancer. The Pritikin Program is recommended and has been proven to help reduce, reverse, and/or prevent the damaging effects of metabolic syndrome.  Nutrition   Overview of the Pritikin Eating Plan  Clinical staff conducted group or individual video education with verbal and written material and guidebook.  Patient learns about the Pritikin Eating Plan for disease risk reduction. The Pritikin Eating Plan emphasizes a wide  variety of unrefined, minimally-processed carbohydrates, like fruits, vegetables, whole grains, and legumes. Go, Caution, and Stop food choices are explained. Plant-based and lean animal proteins are emphasized. Rationale provided for low sodium intake for blood pressure control, low added sugars for blood sugar stabilization, and low added fats and oils for coronary artery disease risk reduction and weight management.  Calorie Density  Clinical staff conducted group or individual video education with verbal and written material and guidebook.  Patient learns about calorie density and how it impacts the Pritikin Eating Plan. Knowing the characteristics of the food you choose will help you decide whether those foods will lead to weight gain or weight loss, and whether you want to consume more or less of them. Weight loss is usually a side effect of the Pritikin Eating Plan because of its focus on low calorie-dense foods.  Label Reading  Clinical staff conducted group or individual video education with verbal and written material and guidebook.  Patient learns about the Pritikin recommended label reading guidelines and corresponding recommendations regarding calorie density, added sugars, sodium content, and whole grains.  Dining Out - Part 1  Clinical staff conducted group or individual video education with verbal and written material and guidebook.  Patient learns that restaurant meals can be sabotaging because they can be so high in calories, fat, sodium, and/or sugar. Patient learns recommended strategies on how to positively address this and avoid unhealthy pitfalls.  Facts on Fats  Clinical staff conducted group or individual video education with verbal and written material and guidebook.  Patient learns that lifestyle modifications can be just as effective, if not more so, as many medications for lowering your risk of heart disease. A Pritikin lifestyle can help to reduce your risk of  inflammation and atherosclerosis (cholesterol build-up, or plaque, in the artery walls). Lifestyle interventions such as dietary choices and physical activity address the cause of atherosclerosis. A review of the types of fats and their impact on blood cholesterol levels, along with dietary recommendations to reduce fat intake is also included.  Nutrition Action Plan  Clinical staff conducted group or individual video education with verbal and written material and guidebook.  Patient learns how to incorporate Pritikin recommendations into their lifestyle. Recommendations include planning and keeping personal health goals in mind as an important part of their success.  Healthy Mind-Set    Healthy Minds, Bodies, Hearts  Clinical staff conducted group or individual video education with verbal and written material and guidebook.  Patient learns how to identify when they are stressed. Video will discuss the impact of that stress, as  well as the many benefits of stress management. Patient will also be introduced to stress management techniques. The way we think, act, and feel has an impact on our hearts.  How Our Thoughts Can Heal Our Hearts  Clinical staff conducted group or individual video education with verbal and written material and guidebook.  Patient learns that negative thoughts can cause depression and anxiety. This can result in negative lifestyle behavior and serious health problems. Cognitive behavioral therapy is an effective method to help control our thoughts in order to change and improve our emotional outlook.  Additional Videos:  Exercise    Improving Performance  Clinical staff conducted group or individual video education with verbal and written material and guidebook.  Patient learns to use a non-linear approach by alternating intensity levels and lengths of time spent exercising to help burn more calories and lose more body fat. Cardiovascular exercise helps improve heart health,  metabolism, hormonal balance, blood sugar control, and recovery from fatigue. Resistance training improves strength, endurance, balance, coordination, reaction time, metabolism, and muscle mass. Flexibility exercise improves circulation, posture, and balance. Seek guidance from your physician and exercise physiologist before implementing an exercise routine and learn your capabilities and proper form for all exercise.  Introduction to Yoga  Clinical staff conducted group or individual video education with verbal and written material and guidebook.  Patient learns about yoga, a discipline of the coming together of mind, breath, and body. The benefits of yoga include improved flexibility, improved range of motion, better posture and core strength, increased lung function, weight loss, and positive self-image. Yoga's heart health benefits include lowered blood pressure, healthier heart rate, decreased cholesterol and triglyceride levels, improved immune function, and reduced stress. Seek guidance from your physician and exercise physiologist before implementing an exercise routine and learn your capabilities and proper form for all exercise.  Medical   Aging: Enhancing Your Quality of Life  Clinical staff conducted group or individual video education with verbal and written material and guidebook.  Patient learns key strategies and recommendations to stay in good physical health and enhance quality of life, such as prevention strategies, having an advocate, securing a Health Care Proxy and Power of Attorney, and keeping a list of medications and system for tracking them. It also discusses how to avoid risk for bone loss.  Biology of Weight Control  Clinical staff conducted group or individual video education with verbal and written material and guidebook.  Patient learns that weight gain occurs because we consume more calories than we burn (eating more, moving less). Even if your body weight is normal, you  may have higher ratios of fat compared to muscle mass. Too much body fat puts you at increased risk for cardiovascular disease, heart attack, stroke, type 2 diabetes, and obesity-related cancers. In addition to exercise, following the Pritikin Eating Plan can help reduce your risk.  Decoding Lab Results  Clinical staff conducted group or individual video education with verbal and written material and guidebook.  Patient learns that lab test reflects one measurement whose values change over time and are influenced by many factors, including medication, stress, sleep, exercise, food, hydration, pre-existing medical conditions, and more. It is recommended to use the knowledge from this video to become more involved with your lab results and evaluate your numbers to speak with your doctor.   Diseases of Our Time - Overview  Clinical staff conducted group or individual video education with verbal and written material and guidebook.  Patient learns that according to  the CDC, 50% to 70% of chronic diseases (such as obesity, type 2 diabetes, elevated lipids, hypertension, and heart disease) are avoidable through lifestyle improvements including healthier food choices, listening to satiety cues, and increased physical activity.  Sleep Disorders Clinical staff conducted group or individual video education with verbal and written material and guidebook.  Patient learns how good quality and duration of sleep are important to overall health and well-being. Patient also learns about sleep disorders and how they impact health along with recommendations to address them, including discussing with a physician.  Nutrition  Dining Out - Part 2 Clinical staff conducted group or individual video education with verbal and written material and guidebook.  Patient learns how to plan ahead and communicate in order to maximize their dining experience in a healthy and nutritious manner. Included are recommended food choices  based on the type of restaurant the patient is visiting.   Fueling a Banker conducted group or individual video education with verbal and written material and guidebook.  There is a strong connection between our food choices and our health. Diseases like obesity and type 2 diabetes are very prevalent and are in large-part due to lifestyle choices. The Pritikin Eating Plan provides plenty of food and hunger-curbing satisfaction. It is easy to follow, affordable, and helps reduce health risks.  Menu Workshop  Clinical staff conducted group or individual video education with verbal and written material and guidebook.  Patient learns that restaurant meals can sabotage health goals because they are often packed with calories, fat, sodium, and sugar. Recommendations include strategies to plan ahead and to communicate with the manager, chef, or server to help order a healthier meal.  Planning Your Eating Strategy  Clinical staff conducted group or individual video education with verbal and written material and guidebook.  Patient learns about the Pritikin Eating Plan and its benefit of reducing the risk of disease. The Pritikin Eating Plan does not focus on calories. Instead, it emphasizes high-quality, nutrient-rich foods. By knowing the characteristics of the foods, we choose, we can determine their calorie density and make informed decisions.  Targeting Your Nutrition Priorities  Clinical staff conducted group or individual video education with verbal and written material and guidebook.  Patient learns that lifestyle habits have a tremendous impact on disease risk and progression. This video provides eating and physical activity recommendations based on your personal health goals, such as reducing LDL cholesterol, losing weight, preventing or controlling type 2 diabetes, and reducing high blood pressure.  Vitamins and Minerals  Clinical staff conducted group or individual video  education with verbal and written material and guidebook.  Patient learns different ways to obtain key vitamins and minerals, including through a recommended healthy diet. It is important to discuss all supplements you take with your doctor.   Healthy Mind-Set    Smoking Cessation  Clinical staff conducted group or individual video education with verbal and written material and guidebook.  Patient learns that cigarette smoking and tobacco addiction pose a serious health risk which affects millions of people. Stopping smoking will significantly reduce the risk of heart disease, lung disease, and many forms of cancer. Recommended strategies for quitting are covered, including working with your doctor to develop a successful plan.  Culinary   Becoming a Set designer conducted group or individual video education with verbal and written material and guidebook.  Patient learns that cooking at home can be healthy, cost-effective, quick, and puts them in control. Keys  to cooking healthy recipes will include looking at your recipe, assessing your equipment needs, planning ahead, making it simple, choosing cost-effective seasonal ingredients, and limiting the use of added fats, salts, and sugars.  Cooking - Breakfast and Snacks  Clinical staff conducted group or individual video education with verbal and written material and guidebook.  Patient learns how important breakfast is to satiety and nutrition through the entire day. Recommendations include key foods to eat during breakfast to help stabilize blood sugar levels and to prevent overeating at meals later in the day. Planning ahead is also a key component.  Cooking - Educational psychologist conducted group or individual video education with verbal and written material and guidebook.  Patient learns eating strategies to improve overall health, including an approach to cook more at home. Recommendations include thinking of  animal protein as a side on your plate rather than center stage and focusing instead on lower calorie dense options like vegetables, fruits, whole grains, and plant-based proteins, such as beans. Making sauces in large quantities to freeze for later and leaving the skin on your vegetables are also recommended to maximize your experience.  Cooking - Healthy Salads and Dressing Clinical staff conducted group or individual video education with verbal and written material and guidebook.  Patient learns that vegetables, fruits, whole grains, and legumes are the foundations of the Pritikin Eating Plan. Recommendations include how to incorporate each of these in flavorful and healthy salads, and how to create homemade salad dressings. Proper handling of ingredients is also covered. Cooking - Soups and [employer] - Soups and Desserts Clinical staff conducted group or individual video education with verbal and written material and guidebook.  Patient learns that Pritikin soups and desserts make for easy, nutritious, and delicious snacks and meal components that are low in sodium, fat, sugar, and calorie density, while high in vitamins, minerals, and filling fiber. Recommendations include simple and healthy ideas for soups and desserts.   Overview     The Pritikin Solution Program Overview Clinical staff conducted group or individual video education with verbal and written material and guidebook.  Patient learns that the results of the Pritikin Program have been documented in more than 100 articles published in peer-reviewed journals, and the benefits include reducing risk factors for (and, in some cases, even reversing) high cholesterol, high blood pressure, type 2 diabetes, obesity, and more! An overview of the three key pillars of the Pritikin Program will be covered: eating well, doing regular exercise, and having a healthy mind-set.  WORKSHOPS  Exercise: Exercise Basics: Building Your Action  Plan Clinical staff led group instruction and group discussion with PowerPoint presentation and patient guidebook. To enhance the learning environment the use of posters, models and videos may be added. At the conclusion of this workshop, patients will comprehend the difference between physical activity and exercise, as well as the benefits of incorporating both, into their routine. Patients will understand the FITT (Frequency, Intensity, Time, and Type) principle and how to use it to build an exercise action plan. In addition, safety concerns and other considerations for exercise and cardiac rehab will be addressed by the presenter. The purpose of this lesson is to promote a comprehensive and effective weekly exercise routine in order to improve patients' overall level of fitness.   Managing Heart Disease: Your Path to a Healthier Heart Clinical staff led group instruction and group discussion with PowerPoint presentation and patient guidebook. To enhance the learning environment the use of  posters, models and videos may be added.At the conclusion of this workshop, patients will understand the anatomy and physiology of the heart. Additionally, they will understand how Pritikin's three pillars impact the risk factors, the progression, and the management of heart disease.  The purpose of this lesson is to provide a high-level overview of the heart, heart disease, and how the Pritikin lifestyle positively impacts risk factors.  Exercise Biomechanics Clinical staff led group instruction and group discussion with PowerPoint presentation and patient guidebook. To enhance the learning environment the use of posters, models and videos may be added. Patients will learn how the structural parts of their bodies function and how these functions impact their daily activities, movement, and exercise. Patients will learn how to promote a neutral spine, learn how to manage pain, and identify ways to improve  their physical movement in order to promote healthy living. The purpose of this lesson is to expose patients to common physical limitations that impact physical activity. Participants will learn practical ways to adapt and manage aches and pains, and to minimize their effect on regular exercise. Patients will learn how to maintain good posture while sitting, walking, and lifting.  Balance Training and Fall Prevention  Clinical staff led group instruction and group discussion with PowerPoint presentation and patient guidebook. To enhance the learning environment the use of posters, models and videos may be added. At the conclusion of this workshop, patients will understand the importance of their sensorimotor skills (vision, proprioception, and the vestibular system) in maintaining their ability to balance as they age. Patients will apply a variety of balancing exercises that are appropriate for their current level of function. Patients will understand the common causes for poor balance, possible solutions to these problems, and ways to modify their physical environment in order to minimize their fall risk. The purpose of this lesson is to teach patients about the importance of maintaining balance as they age and ways to minimize their risk of falling.  WORKSHOPS   Nutrition:  Fueling a Ship broker led group instruction and group discussion with PowerPoint presentation and patient guidebook. To enhance the learning environment the use of posters, models and videos may be added. Patients will review the foundational principles of the Pritikin Eating Plan and understand what constitutes a serving size in each of the food groups. Patients will also learn Pritikin-friendly foods that are better choices when away from home and review make-ahead meal and snack options. Calorie density will be reviewed and applied to three nutrition priorities: weight maintenance, weight loss, and weight  gain. The purpose of this lesson is to reinforce (in a group setting) the key concepts around what patients are recommended to eat and how to apply these guidelines when away from home by planning and selecting Pritikin-friendly options. Patients will understand how calorie density may be adjusted for different weight management goals.  Mindful Eating  Clinical staff led group instruction and group discussion with PowerPoint presentation and patient guidebook. To enhance the learning environment the use of posters, models and videos may be added. Patients will briefly review the concepts of the Pritikin Eating Plan and the importance of low-calorie dense foods. The concept of mindful eating will be introduced as well as the importance of paying attention to internal hunger signals. Triggers for non-hunger eating and techniques for dealing with triggers will be explored. The purpose of this lesson is to provide patients with the opportunity to review the basic principles of the Pritikin Eating Plan, discuss  the value of eating mindfully and how to measure internal cues of hunger and fullness using the Hunger Scale. Patients will also discuss reasons for non-hunger eating and learn strategies to use for controlling emotional eating.  Targeting Your Nutrition Priorities Clinical staff led group instruction and group discussion with PowerPoint presentation and patient guidebook. To enhance the learning environment the use of posters, models and videos may be added. Patients will learn how to determine their genetic susceptibility to disease by reviewing their family history. Patients will gain insight into the importance of diet as part of an overall healthy lifestyle in mitigating the impact of genetics and other environmental insults. The purpose of this lesson is to provide patients with the opportunity to assess their personal nutrition priorities by looking at their family history, their own health history  and current risk factors. Patients will also be able to discuss ways of prioritizing and modifying the Pritikin Eating Plan for their highest risk areas  Menu  Clinical staff led group instruction and group discussion with PowerPoint presentation and patient guidebook. To enhance the learning environment the use of posters, models and videos may be added. Using menus brought in from [employer], or printed from [employer], patients will apply the Pritikin dining out guidelines that were presented in the [employer] video. Patients will also be able to practice these guidelines in a variety of provided scenarios. The purpose of this lesson is to provide patients with the opportunity to practice hands-on learning of the Pritikin Dining Out guidelines with actual menus and practice scenarios.  Label Reading Clinical staff led group instruction and group discussion with PowerPoint presentation and patient guidebook. To enhance the learning environment the use of posters, models and videos may be added. Patients will review and discuss the Pritikin label reading guidelines presented in Pritikin's Label Reading Educational series video. Using fool labels brought in from local grocery stores and markets, patients will apply the label reading guidelines and determine if the packaged food meet the Pritikin guidelines. The purpose of this lesson is to provide patients with the opportunity to review, discuss, and practice hands-on learning of the Pritikin Label Reading guidelines with actual packaged food labels. Cooking School  Pritikin's [employer] are designed to teach patients ways to prepare quick, simple, and affordable recipes at home. The importance of nutrition's role in chronic disease risk reduction is reflected in its emphasis in the overall Pritikin program. By learning how to prepare essential core Pritikin Eating Plan recipes, patients will increase control over  what they eat; be able to customize the flavor of foods without the use of added salt, sugar, or fat; and improve the quality of the food they consume. By learning a set of core recipes which are easily assembled, quickly prepared, and affordable, patients are more likely to prepare more healthy foods at home. These workshops focus on convenient breakfasts, simple entres, side dishes, and desserts which can be prepared with minimal effort and are consistent with nutrition recommendations for cardiovascular risk reduction. Cooking [employer] are taught by a Armed forces logistics/support/administrative officer (RD) who has been trained by the [employer]. The chef or RD has a clear understanding of the importance of minimizing - if not completely eliminating - added fat, sugar, and sodium in recipes. Throughout the series of Cooking School Workshop sessions, patients will learn about healthy ingredients and efficient methods of cooking to build confidence in their capability to prepare  Cooking School weekly topics:  Adding Flavor- Sodium-Free  Fast and Healthy Breakfasts  Powerhouse Plant-Based Proteins  Satisfying Salads and Dressings  Simple Sides and Sauces  International Cuisine-Spotlight on the [employer] Zones  Delicious Desserts  Savory Soups  Efficiency Cooking - Meals in a Snap  Tasty Appetizers and Snacks  Comforting Weekend Breakfasts  One-Pot Wonders   Fast Evening Meals  Landscape architect Your Pritikin Plate  WORKSHOPS   Healthy Mindset (Psychosocial):  Focused Goals, Sustainable Changes Clinical staff led group instruction and group discussion with PowerPoint presentation and patient guidebook. To enhance the learning environment the use of posters, models and videos may be added. Patients will be able to apply effective goal setting strategies to establish at least one personal goal, and then take consistent, meaningful action toward that goal. They will learn to  identify common barriers to achieving personal goals and develop strategies to overcome them. Patients will also gain an understanding of how our mind-set can impact our ability to achieve goals and the importance of cultivating a positive and growth-oriented mind-set. The purpose of this lesson is to provide patients with a deeper understanding of how to set and achieve personal goals, as well as the tools and strategies needed to overcome common obstacles which may arise along the way.  From Head to Heart: The Power of a Healthy Outlook  Clinical staff led group instruction and group discussion with PowerPoint presentation and patient guidebook. To enhance the learning environment the use of posters, models and videos may be added. Patients will be able to recognize and describe the impact of emotions and mood on physical health. They will discover the importance of self-care and explore self-care practices which may work for them. Patients will also learn how to utilize the 4 C's to cultivate a healthier outlook and better manage stress and challenges. The purpose of this lesson is to demonstrate to patients how a healthy outlook is an essential part of maintaining good health, especially as they continue their cardiac rehab journey.  Healthy Sleep for a Healthy Heart Clinical staff led group instruction and group discussion with PowerPoint presentation and patient guidebook. To enhance the learning environment the use of posters, models and videos may be added. At the conclusion of this workshop, patients will be able to demonstrate knowledge of the importance of sleep to overall health, well-being, and quality of life. They will understand the symptoms of, and treatments for, common sleep disorders. Patients will also be able to identify daytime and nighttime behaviors which impact sleep, and they will be able to apply these tools to help manage sleep-related challenges. The purpose of this lesson is to  provide patients with a general overview of sleep and outline the importance of quality sleep. Patients will learn about a few of the most common sleep disorders. Patients will also be introduced to the concept of "sleep hygiene," and discover ways to self-manage certain sleeping problems through simple daily behavior changes. Finally, the workshop will motivate patients by clarifying the links between quality sleep and their goals of heart-healthy living.   Recognizing and Reducing Stress Clinical staff led group instruction and group discussion with PowerPoint presentation and patient guidebook. To enhance the learning environment the use of posters, models and videos may be added. At the conclusion of this workshop, patients will be able to understand the types of stress reactions, differentiate between acute and chronic stress, and recognize the impact that chronic stress has on their health. They will  also be able to apply different coping mechanisms, such as reframing negative self-talk. Patients will have the opportunity to practice a variety of stress management techniques, such as deep abdominal breathing, progressive muscle relaxation, and/or guided imagery.  The purpose of this lesson is to educate patients on the role of stress in their lives and to provide healthy techniques for coping with it.  Learning Barriers/Preferences:  Learning Barriers/Preferences - 11/30/23 1105       Learning Barriers/Preferences   Learning Barriers Sight;Exercise Concerns   Wears reading glasses. Has pain in right shoulder and has a cyst on her mid  lower back   Learning Preferences Skilled Demonstration          Education Topics:  Knowledge Questionnaire Score:  Knowledge Questionnaire Score - 11/30/23 1237       Knowledge Questionnaire Score   Pre Score 22/24          Core Components/Risk Factors/Patient Goals at Admission:  Personal Goals and Risk Factors at Admission - 11/30/23 1036        Core Components/Risk Factors/Patient Goals on Admission    Weight Management Yes;Obesity;Weight Loss    Intervention Weight Management/Obesity: Establish reasonable short term and long term weight goals.;Obesity: Provide education and appropriate resources to help participant work on and attain dietary goals.    Admit Weight 203 lb 7.8 oz (92.3 kg)    Expected Outcomes Short Term: Continue to assess and modify interventions until short term weight is achieved;Long Term: Adherence to nutrition and physical activity/exercise program aimed toward attainment of established weight goal;Weight Loss: Understanding of general recommendations for a balanced deficit meal plan, which promotes 1-2 lb weight loss per week and includes a negative energy balance of [phone] kcal/d    Hypertension Yes    Intervention Provide education on lifestyle modifcations including regular physical activity/exercise, weight management, moderate sodium restriction and increased consumption of fresh fruit, vegetables, and low fat dairy, alcohol moderation, and smoking cessation.;Monitor prescription use compliance.    Expected Outcomes Short Term: Continued assessment and intervention until BP is < 140/43mm HG in hypertensive participants. < 130/34mm HG in hypertensive participants with diabetes, heart failure or chronic kidney disease.;Long Term: Maintenance of blood pressure at goal levels.    Lipids Yes    Intervention Provide education and support for participant on nutrition & aerobic/resistive exercise along with prescribed medications to achieve LDL 70mg , HDL >40mg .    Expected Outcomes Short Term: Participant states understanding of desired cholesterol values and is compliant with medications prescribed. Participant is following exercise prescription and nutrition guidelines.;Long Term: Cholesterol controlled with medications as prescribed, with individualized exercise RX and with personalized nutrition plan. Value goals:  LDL < 70mg , HDL > 40 mg.          Core Components/Risk Factors/Patient Goals Review:    Core Components/Risk Factors/Patient Goals at Discharge (Final Review):    ITP Comments:  ITP Comments     Row Name 11/30/23 1059           ITP Comments Introduction to Pritikin Education Program/Intensive Cardiac Rehab. Initial Orientation Packet reviewed with the patient          Comments: Charlies attended orientation for the cardiac rehabilitation program on  11/30/2023  to perform initial intake and exercise walk test. She was introduced to the [employer] education and orientation packet was reviewed. Completed 6-minute walk test, measurements, initial ITP, and exercise prescription. Vital signs stable. Telemetry-normal sinus rhythm, asymptomatic.   Service time was from 911 to  [id number] Arnoldo CHRISTELLA Valere, MS, ACSM CEP 11/30/2023 [phone]

## 2023-12-06 ENCOUNTER — Encounter (HOSPITAL_COMMUNITY)
Admission: RE | Admit: 2023-12-06 | Discharge: 2023-12-06 | Disposition: A | Discharge status: Home / Self Care | Source: Ambulatory Visit | Attending: Cardiology

## 2023-12-06 DIAGNOSIS — Z955 Presence of coronary angioplasty implant and graft: Secondary | ICD-10-CM

## 2023-12-06 NOTE — Progress Notes (Signed)
 Daily Session Note  Patient Details  Name: CEILIDH TORREGROSSA MRN: [id number] Date of Birth: [birth year] Referring Provider:   Flowsheet Row INTENSIVE CARDIAC REHAB ORIENT from 11/30/2023 in [facility]  Referring Provider Anner Alm ORN, MD    Encounter Date: 12/06/2023  Check In:  Session Check In - 12/06/23 1042       Check-In   Supervising physician immediately available to respond to emergencies [facility] MD immediately available    Physician(s) Rosaline Bane, NP    Location [facility]    Staff Present Hadassah Quan, RN, Avonne Gal, MS, ACSM-CEP, Exercise Physiologist;David Janann, MS, ACSM-CEP, CCRP, Exercise Physiologist;Jetta Walker BS, ACSM-CEP, Exercise Physiologist;Other    Virtual Visit No    Medication changes reported     No    Fall or balance concerns reported    No    Tobacco Cessation No Change    Current number of cigarettes/nicotine per day     0    Warm-up and Cool-down Performed as group-led instruction   Cardiac Rehab Orientation   Resistance Training Performed Yes    VAD Patient? No    PAD/SET Patient? No      Pain Assessment   Currently in Pain? No/denies    Pain Score 0-No pain    Multiple Pain Sites No          Capillary Blood Glucose: No results found for this or any previous visit (from the past 24 hours).   Exercise Prescription Changes - 12/06/23 1035       Response to Exercise   Blood Pressure (Admit) 130/72    Blood Pressure (Exercise) 138/68    Blood Pressure (Exit) 104/68    Heart Rate (Admit) 59 bpm    Heart Rate (Exercise) 100 bpm    Heart Rate (Exit) 68 bpm    Rating of Perceived Exertion (Exercise) 11    Symptoms None    Comments Off to a good start with exercise.    Duration Continue with 30 min of aerobic exercise without signs/symptoms of physical distress.    Intensity THRR unchanged      Progression   Progression Continue to progress  workloads to maintain intensity without signs/symptoms of physical distress.    Average METs 2.1      Resistance Training   Training Prescription Yes    Weight 3 lbs    Reps 10-15    Time 5 Minutes      Interval Training   Interval Training No      Treadmill   MPH 1.7    Grade 0    Minutes 15    METs 2.3      NuStep   Level 2    SPM 78    Minutes 15    METs 2          Social History   Tobacco Use  Smoking Status Never  Smokeless Tobacco Never    Goals Met:  Exercise tolerated well No report of concerns or symptoms today Strength training completed today  Goals Unmet:  Not Applicable  Comments: Pt started cardiac rehab today.  Pt tolerated light exercise without difficulty. VSS, telemetry-Sinus Rhythm, asymptomatic.  Medication list reconciled. Pt denies barriers to medicaiton compliance.  PSYCHOSOCIAL ASSESSMENT:  PHQ-0. Pt exhibits positive coping skills, hopeful outlook with supportive family. No psychosocial needs identified at this time, no psychosocial interventions necessary.    Pt enjoys cooking, baking and doing  activities at the senior center..   Pt oriented to exercise equipment and routine.    Understanding verbalized.Hadassah Elpidio Quan RN BSN    Dr. Wilbert Bihari is Medical Director for Cardiac Rehab at [facility].

## 2023-12-08 ENCOUNTER — Encounter (HOSPITAL_COMMUNITY)
Admission: RE | Admit: 2023-12-08 | Discharge: 2023-12-08 | Disposition: A | Discharge status: Home / Self Care | Source: Ambulatory Visit | Attending: Cardiology

## 2023-12-08 DIAGNOSIS — Z955 Presence of coronary angioplasty implant and graft: Secondary | ICD-10-CM | POA: Diagnosis not present

## 2023-12-10 ENCOUNTER — Encounter (HOSPITAL_COMMUNITY)

## 2023-12-13 ENCOUNTER — Encounter (HOSPITAL_COMMUNITY)
Admission: RE | Admit: 2023-12-13 | Discharge: 2023-12-13 | Disposition: A | Discharge status: Home / Self Care | Source: Ambulatory Visit | Attending: Cardiology | Admitting: Cardiology

## 2023-12-13 DIAGNOSIS — Z955 Presence of coronary angioplasty implant and graft: Secondary | ICD-10-CM | POA: Diagnosis not present

## 2023-12-15 ENCOUNTER — Encounter (HOSPITAL_COMMUNITY)
Admission: RE | Admit: 2023-12-15 | Discharge: 2023-12-15 | Disposition: A | Discharge status: Home / Self Care | Source: Ambulatory Visit | Attending: Cardiology | Admitting: Cardiology

## 2023-12-15 DIAGNOSIS — Z955 Presence of coronary angioplasty implant and graft: Secondary | ICD-10-CM | POA: Diagnosis not present

## 2023-12-17 ENCOUNTER — Other Ambulatory Visit: Payer: Self-pay | Admitting: Family Medicine

## 2023-12-17 ENCOUNTER — Encounter (HOSPITAL_COMMUNITY)

## 2023-12-17 DIAGNOSIS — M25511 Pain in right shoulder: Secondary | ICD-10-CM

## 2023-12-20 ENCOUNTER — Encounter (HOSPITAL_COMMUNITY)
Admission: RE | Admit: 2023-12-20 | Discharge: 2023-12-20 | Disposition: A | Discharge status: Home / Self Care | Source: Ambulatory Visit | Attending: Cardiology | Admitting: Cardiology

## 2023-12-20 DIAGNOSIS — Z955 Presence of coronary angioplasty implant and graft: Secondary | ICD-10-CM | POA: Insufficient documentation

## 2023-12-21 ENCOUNTER — Other Ambulatory Visit (HOSPITAL_BASED_OUTPATIENT_CLINIC_OR_DEPARTMENT_OTHER)

## 2023-12-22 ENCOUNTER — Encounter (HOSPITAL_COMMUNITY)

## 2023-12-23 ENCOUNTER — Ambulatory Visit
Admission: RE | Admit: 2023-12-23 | Discharge: 2023-12-23 | Disposition: A | Discharge status: Home / Self Care | Source: Ambulatory Visit | Attending: Family Medicine | Admitting: Family Medicine

## 2023-12-23 DIAGNOSIS — M25511 Pain in right shoulder: Secondary | ICD-10-CM | POA: Diagnosis present

## 2023-12-24 ENCOUNTER — Encounter (HOSPITAL_COMMUNITY)

## 2023-12-24 ENCOUNTER — Encounter (HOSPITAL_COMMUNITY)
Admission: RE | Admit: 2023-12-24 | Discharge: 2023-12-24 | Disposition: A | Discharge status: Home / Self Care | Source: Ambulatory Visit | Attending: Cardiology | Admitting: Cardiology

## 2023-12-24 DIAGNOSIS — Z955 Presence of coronary angioplasty implant and graft: Secondary | ICD-10-CM | POA: Diagnosis not present

## 2023-12-27 ENCOUNTER — Encounter (HOSPITAL_COMMUNITY)
Admission: RE | Admit: 2023-12-27 | Discharge: 2023-12-27 | Disposition: A | Discharge status: Home / Self Care | Source: Ambulatory Visit | Attending: Cardiology | Admitting: Cardiology

## 2023-12-27 DIAGNOSIS — Z955 Presence of coronary angioplasty implant and graft: Secondary | ICD-10-CM | POA: Diagnosis not present

## 2023-12-28 NOTE — Progress Notes (Signed)
 Cardiac Individual Treatment Plan  Patient Details  Name: Morgan Wolfe MRN: [id number] Date of Birth: [birth year] Referring Provider:   Flowsheet Row INTENSIVE CARDIAC REHAB ORIENT from 11/30/2023 in [facility]  Referring Provider Anner Alm ORN, MD    Initial Encounter Date:  Flowsheet Row INTENSIVE CARDIAC REHAB ORIENT from 11/30/2023 in [facility], [facility]  Date 11/30/23    Visit Diagnosis: 10/20/23 S/P DES mRCA, S/P DES CTO mLAD  Patient's Home Medications on Admission:  Current Outpatient Medications:    allopurinol  (ZYLOPRIM ) 100 MG tablet, Take 1 tablet (100 mg total) by mouth daily. (Patient taking differently: Take 100 mg by mouth daily as needed.), Disp: 30 tablet, Rfl: 1   amLODipine  (NORVASC ) 5 MG tablet, Take 1 tablet (5 mg total) by mouth daily., Disp: 90 tablet, Rfl: 3   aspirin  81 MG chewable tablet, Chew 1 tablet (81 mg total) by mouth daily., Disp: 90 tablet, Rfl: 1   cholecalciferol (VITAMIN D3) 25 MCG (1000 UNIT) tablet, Take 1,000 Units by mouth daily., Disp: , Rfl:    clopidogrel  (PLAVIX ) 75 MG tablet, Take 1 tablet (75 mg total) by mouth daily with breakfast., Disp: 90 tablet, Rfl: 3   fluticasone  (FLONASE ) 50 MCG/ACT nasal spray, SHAKE LIQUID AND USE 2 SPRAYS IN EACH NOSTRIL DAILY, Disp: 16 g, Rfl: 5   loratadine  (CLARITIN ) 10 MG tablet, Take 1 tablet (10 mg total) by mouth daily as needed for allergies., Disp: 30 tablet, Rfl: 1   losartan  (COZAAR ) 100 MG tablet, Take 1 tablet (100 mg total) by mouth every morning., Disp: 90 tablet, Rfl: 3   metoprolol  tartrate (LOPRESSOR ) 50 MG tablet, Take 1 tablet (50 mg total) by mouth 2 (two) times daily., Disp: 180 tablet, Rfl: 3   rosuvastatin  (CRESTOR ) 40 MG tablet, Take 1 tablet (40 mg total) by mouth daily., Disp: 90 tablet, Rfl: 3  Past Medical History: Past Medical History:  Diagnosis Date   ALLERGIC  RHINITIS    Coronary artery disease    Hypertension    PONV (postoperative nausea and vomiting)    Right shoulder pain     Tobacco Use: Social History   Tobacco Use  Smoking Status Never  Smokeless Tobacco Never    Labs: Review Flowsheet  More data exists      Latest Ref Rng & Units 02/06/2015 01/04/2017 01/10/2018 10/28/2020 11/18/2023  Labs for ITP Cardiac and Pulmonary Rehab  Cholestrol 100 - 199 mg/dL 820  814  818  815  881   LDL (calc) 0 - 99 mg/dL 84  - 85  99  41   Direct LDL mg/dL - 40.9  - - -  HDL-C >60 mg/dL 38.19  38.59  39.09  48.49  48   Trlycerides 0 - 149 mg/dL 837.9  759.9  825.9  833.9  179     Capillary Blood Glucose: No results found for: GLUCAP   Exercise Target Goals: Exercise Program Goal: Individual exercise prescription set using results from initial 6 min walk test and THRR while considering  patient's activity barriers and safety.   Exercise Prescription Goal: Initial exercise prescription builds to 30-45 minutes a day of aerobic activity, 2-3 days per week.  Home exercise guidelines will be given to patient during program as part of exercise prescription that the participant will acknowledge.  Activity Barriers & Risk Stratification:  Activity Barriers & Cardiac Risk Stratification - 11/30/23 1036  Activity Barriers & Cardiac Risk Stratification   Activity Barriers Other (comment)    Comments Right rotator cuff problem. Left ankle reconstruction.    Cardiac Risk Stratification Low          6 Minute Walk:  6 Minute Walk     Row Name 11/30/23 1052         6 Minute Walk   Phase Initial     Distance 1645 feet     Walk Time 6 minutes     # of Rest Breaks 0     MPH 3.11     METS 3.54     RPE 7     Perceived Dyspnea  0     VO2 Peak 12.4     Symptoms No     Resting HR 58 bpm     Resting BP 110/70     Resting Oxygen Saturation  98 %     Exercise Oxygen Saturation  during 6 min walk 96 %     Max Ex. HR 93 bpm     Max Ex.  BP 130/60     2 Minute Post BP 118/60        Oxygen Initial Assessment:   Oxygen Re-Evaluation:   Oxygen Discharge (Final Oxygen Re-Evaluation):   Initial Exercise Prescription:  Initial Exercise Prescription - 11/30/23 1200       Date of Initial Exercise RX and Referring Provider   Date 11/30/23    Referring Provider Anner Alm ORN, MD    Expected Discharge Date 02/25/24      Treadmill   MPH 3    Grade 0    Minutes 15    METs 3.3      NuStep   Level 2    SPM 85    Minutes 15    METs 2.8      Prescription Details   Frequency (times per week) 3    Duration Progress to 30 minutes of continuous aerobic without signs/symptoms of physical distress      Intensity   THRR 40-80% of Max Heartrate 62-124    Ratings of Perceived Exertion 11-13    Perceived Dyspnea 0-4      Progression   Progression Continue to progress workloads to maintain intensity without signs/symptoms of physical distress.      Resistance Training   Training Prescription Yes    Weight 3 lbs    Reps 10-15          Perform Capillary Blood Glucose checks as needed.  Exercise Prescription Changes:   Exercise Prescription Changes     Row Name 12/06/23 1035 12/20/23 1035           Response to Exercise   Blood Pressure (Admit) 130/72 108/66      Blood Pressure (Exercise) 138/68 138/64      Blood Pressure (Exit) 104/68 118/62      Heart Rate (Admit) 59 bpm 58 bpm      Heart Rate (Exercise) 100 bpm 91 bpm      Heart Rate (Exit) 68 bpm 66 bpm      Rating of Perceived Exertion (Exercise) 11 11      Symptoms None None      Comments Off to a good start with exercise. --      Duration Continue with 30 min of aerobic exercise without signs/symptoms of physical distress. Continue with 30 min of aerobic exercise without signs/symptoms of physical distress.      Intensity THRR unchanged  THRR unchanged        Progression   Progression Continue to progress workloads to maintain intensity  without signs/symptoms of physical distress. Continue to progress workloads to maintain intensity without signs/symptoms of physical distress.      Average METs 2.1 2.4        Resistance Training   Training Prescription Yes Yes      Weight 3 lbs 3 lbs      Reps 10-15 10-15      Time 5 Minutes 5 Minutes        Interval Training   Interval Training No No        Treadmill   MPH 1.7 1.7      Grade 0 0      Minutes 15 15      METs 2.3 2.3        NuStep   Level 2 2      SPM 78 91      Minutes 15 15      METs 2 2.3         Exercise Comments:   Exercise Comments     Row Name 12/06/23 1135 12/24/23 1140         Exercise Comments [name] tolerated low intensity exercise well without symptoms. Oriented her to the equipement and the warm-up and cool-down stretches. Reviewed METs and goals with [name].         Exercise Goals and Review:   Exercise Goals     Row Name 11/30/23 1036             Exercise Goals   Increase Physical Activity Yes       Intervention Provide advice, education, support and counseling about physical activity/exercise needs.;Develop an individualized exercise prescription for aerobic and resistive training based on initial evaluation findings, risk stratification, comorbidities and participant's personal goals.       Expected Outcomes Long Term: Exercising regularly at least 3-5 days a week.;Long Term: Add in home exercise to make exercise part of routine and to increase amount of physical activity.;Short Term: Attend rehab on a regular basis to increase amount of physical activity.       Increase Strength and Stamina Yes       Intervention Provide advice, education, support and counseling about physical activity/exercise needs.;Develop an individualized exercise prescription for aerobic and resistive training based on initial evaluation findings, risk stratification, comorbidities and participant's personal goals.       Expected Outcomes Short Term:  Increase workloads from initial exercise prescription for resistance, speed, and METs.;Short Term: Perform resistance training exercises routinely during rehab and add in resistance training at home;Long Term: Improve cardiorespiratory fitness, muscular endurance and strength as measured by increased METs and functional capacity ( )       Able to understand and use rate of perceived exertion (RPE) scale Yes       Intervention Provide education and explanation on how to use RPE scale       Expected Outcomes Short Term: Able to use RPE daily in rehab to express subjective intensity level;Long Term:  Able to use RPE to guide intensity level when exercising independently       Knowledge and understanding of Target Heart Rate Range (THRR) Yes       Intervention Provide education and explanation of THRR including how the numbers were predicted and where they are located for reference       Expected Outcomes Short Term: Able to state/look up THRR;Long Term:  Able to use THRR to govern intensity when exercising independently;Short Term: Able to use daily as guideline for intensity in rehab       Able to check pulse independently Yes       Intervention Provide education and demonstration on how to check pulse in carotid and radial arteries.;Review the importance of being able to check your own pulse for safety during independent exercise       Expected Outcomes Short Term: Able to explain why pulse checking is important during independent exercise;Long Term: Able to check pulse independently and accurately       Understanding of Exercise Prescription Yes       Intervention Provide education, explanation, and written materials on patient's individual exercise prescription       Expected Outcomes Short Term: Able to explain program exercise prescription;Long Term: Able to explain home exercise prescription to exercise independently          Exercise Goals Re-Evaluation :  Exercise Goals Re-Evaluation      Row Name 12/06/23 1135 12/24/23 1140           Exercise Goal Re-Evaluation   Exercise Goals Review Increase Physical Activity;Increase Strength and Stamina;Able to understand and use rate of perceived exertion (RPE) scale Increase Physical Activity;Increase Strength and Stamina;Able to understand and use rate of perceived exertion (RPE) scale      Comments [name] was able to understand and use RPE scale appropriately. Charlies is making good progess with exercise thus far. She is doing chair yoga on Tuesdays in addition to exercise at cardiac rehab.      Expected Outcomes Progress workloads as tolerated to help increase strength and stamina. Continue yoga, progess workloads as tolerated.         Discharge Exercise Prescription (Final Exercise Prescription Changes):  Exercise Prescription Changes - 12/20/23 1035       Response to Exercise   Blood Pressure (Admit) 108/66    Blood Pressure (Exercise) 138/64    Blood Pressure (Exit) 118/62    Heart Rate (Admit) 58 bpm    Heart Rate (Exercise) 91 bpm    Heart Rate (Exit) 66 bpm    Rating of Perceived Exertion (Exercise) 11    Symptoms None    Duration Continue with 30 min of aerobic exercise without signs/symptoms of physical distress.    Intensity THRR unchanged      Progression   Progression Continue to progress workloads to maintain intensity without signs/symptoms of physical distress.    Average METs 2.4      Resistance Training   Training Prescription Yes    Weight 3 lbs    Reps 10-15    Time 5 Minutes      Interval Training   Interval Training No      Treadmill   MPH 1.7    Grade 0    Minutes 15    METs 2.3      NuStep   Level 2    SPM 91    Minutes 15    METs 2.3          Nutrition:  Target Goals: Understanding of nutrition guidelines, daily intake of sodium 1500mg , cholesterol 200mg , calories 30% from fat and 7% or less from saturated fats, daily to have 5 or more servings of fruits and  vegetables.  Biometrics:  Pre Biometrics - 11/30/23 1058       Pre Biometrics   Waist Circumference 39.25 inches    Hip Circumference 46 inches  Waist to Hip Ratio 0.85 %    Triceps Skinfold 44 mm    % Body Fat 43.5 %    Grip Strength 20 kg    Flexibility 17.38 in    Single Leg Stand 20 seconds           Nutrition Therapy Plan and Nutrition Goals:  Nutrition Therapy & Goals - 12/06/23 1146       Nutrition Therapy   Diet Heart Healthy Diet    Drug/Food Interactions Statins/Certain Fruits      Personal Nutrition Goals   Nutrition Goal Patient to identify strategies for reducing cardiovascular risk by attending the [employer] education and nutrition series weekly.    Personal Goal #2 Patient to improve diet quality by using the plate method as a guide for meal planning to include lean protein/plant protein, fruits, vegetables, whole grains, nonfat dairy as part of a well-balanced diet.    Comments Charlies has medical history of HTN, hyperlipidemia, CAD s/p coronary artery stent placement.LDL is at goal. Triglycerides remain elevated. She is motivated to lose weight; will continue to discuss strategies for weight loss including calorie density, label reading, the plate method as a guide for meal planning,etc. Patient will benefit from participation in intensive cardiac rehab for nutrition education, exercise, and lifestyle modification.      Intervention Plan   Intervention Prescribe, educate and counsel regarding individualized specific dietary modifications aiming towards targeted core components such as weight, hypertension, lipid management, diabetes, heart failure and other comorbidities.;Nutrition handout(s) given to patient.    Expected Outcomes Short Term Goal: Understand basic principles of dietary content, such as calories, fat, sodium, cholesterol and nutrients.;Long Term Goal: Adherence to prescribed nutrition plan.          Nutrition Assessments:  Nutrition  Assessments - 12/02/23 1103       Rate Your Plate Scores   Pre Score 54         MEDIFICTS Score Key: >=70 Need to make dietary changes  40-70 Heart Healthy Diet <= 40 Therapeutic Level Cholesterol Diet   Flowsheet Row INTENSIVE CARDIAC REHAB ORIENT from 11/30/2023 in [facility]  Picture Your Plate Total Score on Admission 54   Picture Your Plate Scores: <59 Unhealthy dietary pattern with much room for improvement. 41-50 Dietary pattern unlikely to meet recommendations for good health and room for improvement. 51-60 More healthful dietary pattern, with some room for improvement.  >60 Healthy dietary pattern, although there may be some specific behaviors that could be improved.    Nutrition Goals Re-Evaluation:  Nutrition Goals Re-Evaluation     Row Name 12/06/23 1146             Goals   Current Weight 203 lb 7.8 oz (92.3 kg)       Comment LDL 41, triglycerides 179, HDL 48       Expected Outcome [name] has medical history of HTN, hyperlipidemia, CAD s/p coronary artery stent placement.LDL is at goal. Triglycerides remain elevated. She is motivated to lose weight; will continue to discuss strategies for weight loss including calorie density, label reading, the plate method as a guide for meal planning,etc. Patient will benefit from participation in intensive cardiac rehab for nutrition education, exercise, and lifestyle modification.          Nutrition Goals Re-Evaluation:  Nutrition Goals Re-Evaluation     Row Name 12/06/23 1146             Goals  Current Weight 203 lb 7.8 oz (92.3 kg)       Comment LDL 41, triglycerides 179, HDL 48       Expected Outcome [name] has medical history of HTN, hyperlipidemia, CAD s/p coronary artery stent placement.LDL is at goal. Triglycerides remain elevated. She is motivated to lose weight; will continue to discuss strategies for weight loss including calorie density, label  reading, the plate method as a guide for meal planning,etc. Patient will benefit from participation in intensive cardiac rehab for nutrition education, exercise, and lifestyle modification.          Nutrition Goals Discharge (Final Nutrition Goals Re-Evaluation):  Nutrition Goals Re-Evaluation - 12/06/23 1146       Goals   Current Weight 203 lb 7.8 oz (92.3 kg)    Comment LDL 41, triglycerides 179, HDL 48    Expected Outcome [name] has medical history of HTN, hyperlipidemia, CAD s/p coronary artery stent placement.LDL is at goal. Triglycerides remain elevated. She is motivated to lose weight; will continue to discuss strategies for weight loss including calorie density, label reading, the plate method as a guide for meal planning,etc. Patient will benefit from participation in intensive cardiac rehab for nutrition education, exercise, and lifestyle modification.          Psychosocial: Target Goals: Acknowledge presence or absence of significant depression and/or stress, maximize coping skills, provide positive support system. Participant is able to verbalize types and ability to use techniques and skills needed for reducing stress and depression.  Initial Review & Psychosocial Screening:  Initial Psych Review & Screening - 11/30/23 1101       Initial Review   Current issues with Current Stress Concerns    Source of Stress Concerns Chronic Illness    Comments Charlies denies having depression or anxiety. [name] mention having some health concerns regarding her right shoulder and mid lower back      Family Dynamics   Good Support System? Yes   [name] lives alone. Charlies has a nephew she can rely on for support. Charlies has 2 brothers who live in the area   Comments Charlies says she mainly relies on herself      Barriers   Psychosocial barriers to participate in program The patient should benefit from training in stress management and relaxation.      Screening Interventions   Interventions  Encouraged to exercise    Expected Outcomes Long Term Goal: Stressors or current issues are controlled or eliminated.          Quality of Life Scores:  Quality of Life - 11/30/23 1236       Quality of Life   Select Quality of Life      Quality of Life Scores   Health/Function Pre 28.8 %    Socioeconomic Pre 29.64 %    Psych/Spiritual Pre 30 %    Family Pre 20.4 %    GLOBAL Pre 27.99 %         Scores of 19 and below usually indicate a poorer quality of life in these areas.  A difference of  2-3 points is a clinically meaningful difference.  A difference of 2-3 points in the total score of the Quality of Life Index has been associated with significant improvement in overall quality of life, self-image, physical symptoms, and general health in studies assessing change in quality of life.  PHQ-9: Review Flowsheet  More data exists      11/30/2023 07/10/2022 07/15/2021 04/30/2021 11/22/2019  Depression screen PHQ  2/9  Decreased Interest 0 0 3 0 0  Down, Depressed, Hopeless 0 0 0 0 0  PHQ - 2 Score 0 0 3 0 0  Altered sleeping 0 - 3 3 -  Tired, decreased energy 0 - 2 0 -  Change in appetite 0 - 0 0 -  Feeling bad or failure about yourself  0 - 0 0 -  Trouble concentrating 0 - 0 0 -  Moving slowly or fidgety/restless 0 - 3 0 -  Suicidal thoughts 0 - 0 0 -  PHQ-9 Score 0 - 11 3 -  Difficult doing work/chores Not difficult at all - - - -   Interpretation of Total Score  Total Score Depression Severity:  1-4 = Minimal depression, 5-9 = Mild depression, 10-14 = Moderate depression, 15-19 = Moderately severe depression, 20-27 = Severe depression   Psychosocial Evaluation and Intervention:   Psychosocial Re-Evaluation:  Psychosocial Re-Evaluation     Row Name 12/06/23 1716 12/28/23 1408           Psychosocial Re-Evaluation   Current issues with Current Stress Concerns Current Stress Concerns      Comments [name] did not voice any increased concerns or stressors on her first  day of exercise [name] continues not voice any increased concerns or stressors during exercise at cardiac rehab.      Expected Outcomes [name] will have controlled or decreased stressors upon completion of cardiac rehab [name] will have controlled or decreased stressors upon completion of cardiac rehab      Interventions Stress management education;Relaxation education;Encouraged to attend Cardiac Rehabilitation for the exercise Stress management education;Relaxation education;Encouraged to attend Cardiac Rehabilitation for the exercise      Continue Psychosocial Services  No Follow up required No Follow up required        Initial Review   Source of Stress Concerns Chronic Illness Chronic Illness      Comments will continue to monitor and offer support as needed. will continue to monitor and offer support as needed.         Psychosocial Discharge (Final Psychosocial Re-Evaluation):  Psychosocial Re-Evaluation - 12/28/23 1408       Psychosocial Re-Evaluation   Current issues with Current Stress Concerns    Comments [name] continues not voice any increased concerns or stressors during exercise at cardiac rehab.    Expected Outcomes [name] will have controlled or decreased stressors upon completion of cardiac rehab    Interventions Stress management education;Relaxation education;Encouraged to attend Cardiac Rehabilitation for the exercise    Continue Psychosocial Services  No Follow up required      Initial Review   Source of Stress Concerns Chronic Illness    Comments will continue to monitor and offer support as needed.          Vocational Rehabilitation: Provide vocational rehab assistance to qualifying candidates.   Vocational Rehab Evaluation & Intervention:  Vocational Rehab - 11/30/23 1104       Initial Vocational Rehab Evaluation & Intervention   Assessment shows need for Vocational Rehabilitation No   Charlies has been retired since 2020 and does not need vocational rehab at this  time         Education: Education Goals: Education classes will be provided on a weekly basis, covering required topics. Participant will state understanding/return demonstration of topics presented.    Education     Row Name 12/08/23 1100     Education   Cardiac Education Topics [employer]   [employer]  Public house manager   Instruction Review Code 1- Verbalizes Understanding   Class Start Time 1145   Class Stop Time 1225   Class Time Calculation (min) 40 min    Row Name 12/15/23 1600     Education   Cardiac Education Topics [employer]   Customer service manager   Weekly Topic Adding Flavor - Sodium-Free   Instruction Review Code 1- Verbalizes Understanding   Class Start Time 1140   Class Stop Time 1215   Class Time Calculation (min) 35 min      Core Videos: Exercise    Move It!  Clinical staff conducted group or individual video education with verbal and written material and guidebook.  Patient learns the recommended [employer]in exercise program. Exercise with the goal of living a long, healthy life. Some of the health benefits of exercise include controlled diabetes, healthier blood pressure levels, improved cholesterol levels, improved heart and lung capacity, improved sleep, and better body composition. Everyone should speak with their doctor before starting or changing an exercise routine.  Biomechanical Limitations Clinical staff conducted group or individual video education with verbal and written material and guidebook.  Patient learns how biomechanical limitations can impact exercise and how we can mitigate and possibly overcome limitations to have an impactful and balanced exercise routine.  Body Composition Clinical staff conducted group or individual video education with verbal and written material and guidebook.  Patient learns that body composition  (ratio of muscle mass to fat mass) is a key component to assessing overall fitness, rather than body weight alone. Increased fat mass, especially visceral belly fat, can put us  at increased risk for metabolic syndrome, type 2 diabetes, heart disease, and even death. It is recommended to combine diet and exercise (cardiovascular and resistance training) to improve your body composition. Seek guidance from your physician and exercise physiologist before implementing an exercise routine.  Exercise Action Plan Clinical staff conducted group or individual video education with verbal and written material and guidebook.  Patient learns the recommended strategies to achieve and enjoy long-term exercise adherence, including variety, self-motivation, self-efficacy, and positive decision making. Benefits of exercise include fitness, good health, weight management, more energy, better sleep, less stress, and overall well-being.  Medical   Heart Disease Risk Reduction Clinical staff conducted group or individual video education with verbal and written material and guidebook.  Patient learns our heart is our most vital organ as it circulates oxygen, nutrients, white blood cells, and hormones throughout the entire body, and carries waste away. Data supports a plant-based eating plan like the Pritikin Program for its effectiveness in slowing progression of and reversing heart disease. The video provides a number of recommendations to address heart disease.   Metabolic Syndrome and Belly Fat  Clinical staff conducted group or individual video education with verbal and written material and guidebook.  Patient learns what metabolic syndrome is, how it leads to heart disease, and how one can reverse it and keep it from coming back. You have metabolic syndrome if you have 3 of the following 5 criteria: abdominal obesity, high blood pressure, high triglycerides, low HDL cholesterol, and high blood sugar.  Hypertension and  Heart Disease Clinical staff conducted group or individual video education with verbal and written material and guidebook.  Patient learns that high blood pressure, or hypertension, is very common in the [place] . Hypertension is largely due to  excessive salt intake, but other important risk factors include being overweight, physical inactivity, drinking too much alcohol, smoking, and not eating enough potassium from fruits and vegetables. High blood pressure is a leading risk factor for heart attack, stroke, congestive heart failure, dementia, kidney failure, and premature death. Long-term effects of excessive salt intake include stiffening of the arteries and thickening of heart muscle and organ damage. Recommendations include ways to reduce hypertension and the risk of heart disease.  Diseases of Our Time - Focusing on Diabetes Clinical staff conducted group or individual video education with verbal and written material and guidebook.  Patient learns why the best way to stop diseases of our time is prevention, through food and other lifestyle changes. Medicine (such as prescription pills and surgeries) is often only a Band-Aid on the problem, not a long-term solution. Most common diseases of our time include obesity, type 2 diabetes, hypertension, heart disease, and cancer. The Pritikin Program is recommended and has been proven to help reduce, reverse, and/or prevent the damaging effects of metabolic syndrome.  Nutrition   Overview of the Pritikin Eating Plan  Clinical staff conducted group or individual video education with verbal and written material and guidebook.  Patient learns about the Pritikin Eating Plan for disease risk reduction. The Pritikin Eating Plan emphasizes a wide variety of unrefined, minimally-processed carbohydrates, like fruits, vegetables, whole grains, and legumes. Go, Caution, and Stop food choices are explained. Plant-based and lean animal proteins are emphasized.  Rationale provided for low sodium intake for blood pressure control, low added sugars for blood sugar stabilization, and low added fats and oils for coronary artery disease risk reduction and weight management.  Calorie Density  Clinical staff conducted group or individual video education with verbal and written material and guidebook.  Patient learns about calorie density and how it impacts the Pritikin Eating Plan. Knowing the characteristics of the food you choose will help you decide whether those foods will lead to weight gain or weight loss, and whether you want to consume more or less of them. Weight loss is usually a side effect of the Pritikin Eating Plan because of its focus on low calorie-dense foods.  Label Reading  Clinical staff conducted group or individual video education with verbal and written material and guidebook.  Patient learns about the Pritikin recommended label reading guidelines and corresponding recommendations regarding calorie density, added sugars, sodium content, and whole grains.  Dining Out - Part 1  Clinical staff conducted group or individual video education with verbal and written material and guidebook.  Patient learns that restaurant meals can be sabotaging because they can be so high in calories, fat, sodium, and/or sugar. Patient learns recommended strategies on how to positively address this and avoid unhealthy pitfalls.  Facts on Fats  Clinical staff conducted group or individual video education with verbal and written material and guidebook.  Patient learns that lifestyle modifications can be just as effective, if not more so, as many medications for lowering your risk of heart disease. A Pritikin lifestyle can help to reduce your risk of inflammation and atherosclerosis (cholesterol build-up, or plaque, in the artery walls). Lifestyle interventions such as dietary choices and physical activity address the cause of atherosclerosis. A review of the types of  fats and their impact on blood cholesterol levels, along with dietary recommendations to reduce fat intake is also included.  Nutrition Action Plan  Clinical staff conducted group or individual video education with verbal and written material and guidebook.  Patient learns  how to incorporate Pritikin recommendations into their lifestyle. Recommendations include planning and keeping personal health goals in mind as an important part of their success.  Healthy Mind-Set    Healthy Minds, Bodies, Hearts  Clinical staff conducted group or individual video education with verbal and written material and guidebook.  Patient learns how to identify when they are stressed. Video will discuss the impact of that stress, as well as the many benefits of stress management. Patient will also be introduced to stress management techniques. The way we think, act, and feel has an impact on our hearts.  How Our Thoughts Can Heal Our Hearts  Clinical staff conducted group or individual video education with verbal and written material and guidebook.  Patient learns that negative thoughts can cause depression and anxiety. This can result in negative lifestyle behavior and serious health problems. Cognitive behavioral therapy is an effective method to help control our thoughts in order to change and improve our emotional outlook.  Additional Videos:  Exercise    Improving Performance  Clinical staff conducted group or individual video education with verbal and written material and guidebook.  Patient learns to use a non-linear approach by alternating intensity levels and lengths of time spent exercising to help burn more calories and lose more body fat. Cardiovascular exercise helps improve heart health, metabolism, hormonal balance, blood sugar control, and recovery from fatigue. Resistance training improves strength, endurance, balance, coordination, reaction time, metabolism, and muscle mass. Flexibility exercise  improves circulation, posture, and balance. Seek guidance from your physician and exercise physiologist before implementing an exercise routine and learn your capabilities and proper form for all exercise.  Introduction to Yoga  Clinical staff conducted group or individual video education with verbal and written material and guidebook.  Patient learns about yoga, a discipline of the coming together of mind, breath, and body. The benefits of yoga include improved flexibility, improved range of motion, better posture and core strength, increased lung function, weight loss, and positive self-image. Yoga's heart health benefits include lowered blood pressure, healthier heart rate, decreased cholesterol and triglyceride levels, improved immune function, and reduced stress. Seek guidance from your physician and exercise physiologist before implementing an exercise routine and learn your capabilities and proper form for all exercise.  Medical   Aging: Enhancing Your Quality of Life  Clinical staff conducted group or individual video education with verbal and written material and guidebook.  Patient learns key strategies and recommendations to stay in good physical health and enhance quality of life, such as prevention strategies, having an advocate, securing a Health Care Proxy and Power of Attorney, and keeping a list of medications and system for tracking them. It also discusses how to avoid risk for bone loss.  Biology of Weight Control  Clinical staff conducted group or individual video education with verbal and written material and guidebook.  Patient learns that weight gain occurs because we consume more calories than we burn (eating more, moving less). Even if your body weight is normal, you may have higher ratios of fat compared to muscle mass. Too much body fat puts you at increased risk for cardiovascular disease, heart attack, stroke, type 2 diabetes, and obesity-related cancers. In addition to  exercise, following the Pritikin Eating Plan can help reduce your risk.  Decoding Lab Results  Clinical staff conducted group or individual video education with verbal and written material and guidebook.  Patient learns that lab test reflects one measurement whose values change over time and are influenced by many  factors, including medication, stress, sleep, exercise, food, hydration, pre-existing medical conditions, and more. It is recommended to use the knowledge from this video to become more involved with your lab results and evaluate your numbers to speak with your doctor.   Diseases of Our Time - Overview  Clinical staff conducted group or individual video education with verbal and written material and guidebook.  Patient learns that according to the CDC, 50% to 70% of chronic diseases (such as obesity, type 2 diabetes, elevated lipids, hypertension, and heart disease) are avoidable through lifestyle improvements including healthier food choices, listening to satiety cues, and increased physical activity.  Sleep Disorders Clinical staff conducted group or individual video education with verbal and written material and guidebook.  Patient learns how good quality and duration of sleep are important to overall health and well-being. Patient also learns about sleep disorders and how they impact health along with recommendations to address them, including discussing with a physician.  Nutrition  Dining Out - Part 2 Clinical staff conducted group or individual video education with verbal and written material and guidebook.  Patient learns how to plan ahead and communicate in order to maximize their dining experience in a healthy and nutritious manner. Included are recommended food choices based on the type of restaurant the patient is visiting.   Fueling a Banker conducted group or individual video education with verbal and written material and guidebook.  There is a  strong connection between our food choices and our health. Diseases like obesity and type 2 diabetes are very prevalent and are in large-part due to lifestyle choices. The Pritikin Eating Plan provides plenty of food and hunger-curbing satisfaction. It is easy to follow, affordable, and helps reduce health risks.  Menu Workshop  Clinical staff conducted group or individual video education with verbal and written material and guidebook.  Patient learns that restaurant meals can sabotage health goals because they are often packed with calories, fat, sodium, and sugar. Recommendations include strategies to plan ahead and to communicate with the manager, chef, or server to help order a healthier meal.  Planning Your Eating Strategy  Clinical staff conducted group or individual video education with verbal and written material and guidebook.  Patient learns about the Pritikin Eating Plan and its benefit of reducing the risk of disease. The Pritikin Eating Plan does not focus on calories. Instead, it emphasizes high-quality, nutrient-rich foods. By knowing the characteristics of the foods, we choose, we can determine their calorie density and make informed decisions.  Targeting Your Nutrition Priorities  Clinical staff conducted group or individual video education with verbal and written material and guidebook.  Patient learns that lifestyle habits have a tremendous impact on disease risk and progression. This video provides eating and physical activity recommendations based on your personal health goals, such as reducing LDL cholesterol, losing weight, preventing or controlling type 2 diabetes, and reducing high blood pressure.  Vitamins and Minerals  Clinical staff conducted group or individual video education with verbal and written material and guidebook.  Patient learns different ways to obtain key vitamins and minerals, including through a recommended healthy diet. It is important to discuss all  supplements you take with your doctor.   Healthy Mind-Set    Smoking Cessation  Clinical staff conducted group or individual video education with verbal and written material and guidebook.  Patient learns that cigarette smoking and tobacco addiction pose a serious health risk which affects millions of people. Stopping smoking will significantly reduce  the risk of heart disease, lung disease, and many forms of cancer. Recommended strategies for quitting are covered, including working with your doctor to develop a successful plan.  Culinary   Becoming a Set designer conducted group or individual video education with verbal and written material and guidebook.  Patient learns that cooking at home can be healthy, cost-effective, quick, and puts them in control. Keys to cooking healthy recipes will include looking at your recipe, assessing your equipment needs, planning ahead, making it simple, choosing cost-effective seasonal ingredients, and limiting the use of added fats, salts, and sugars.  Cooking - Breakfast and Snacks  Clinical staff conducted group or individual video education with verbal and written material and guidebook.  Patient learns how important breakfast is to satiety and nutrition through the entire day. Recommendations include key foods to eat during breakfast to help stabilize blood sugar levels and to prevent overeating at meals later in the day. Planning ahead is also a key component.  Cooking - Educational psychologist conducted group or individual video education with verbal and written material and guidebook.  Patient learns eating strategies to improve overall health, including an approach to cook more at home. Recommendations include thinking of animal protein as a side on your plate rather than center stage and focusing instead on lower calorie dense options like vegetables, fruits, whole grains, and plant-based proteins, such as beans. Making sauces  in large quantities to freeze for later and leaving the skin on your vegetables are also recommended to maximize your experience.  Cooking - Healthy Salads and Dressing Clinical staff conducted group or individual video education with verbal and written material and guidebook.  Patient learns that vegetables, fruits, whole grains, and legumes are the foundations of the Pritikin Eating Plan. Recommendations include how to incorporate each of these in flavorful and healthy salads, and how to create homemade salad dressings. Proper handling of ingredients is also covered. Cooking - Soups and [employer] - Soups and Desserts Clinical staff conducted group or individual video education with verbal and written material and guidebook.  Patient learns that Pritikin soups and desserts make for easy, nutritious, and delicious snacks and meal components that are low in sodium, fat, sugar, and calorie density, while high in vitamins, minerals, and filling fiber. Recommendations include simple and healthy ideas for soups and desserts.   Overview     The Pritikin Solution Program Overview Clinical staff conducted group or individual video education with verbal and written material and guidebook.  Patient learns that the results of the Pritikin Program have been documented in more than 100 articles published in peer-reviewed journals, and the benefits include reducing risk factors for (and, in some cases, even reversing) high cholesterol, high blood pressure, type 2 diabetes, obesity, and more! An overview of the three key pillars of the Pritikin Program will be covered: eating well, doing regular exercise, and having a healthy mind-set.  WORKSHOPS  Exercise: Exercise Basics: Building Your Action Plan Clinical staff led group instruction and group discussion with PowerPoint presentation and patient guidebook. To enhance the learning environment the use of posters, models and videos may be added. At the  conclusion of this workshop, patients will comprehend the difference between physical activity and exercise, as well as the benefits of incorporating both, into their routine. Patients will understand the FITT (Frequency, Intensity, Time, and Type) principle and how to use it to build an exercise action plan. In addition, safety concerns and  other considerations for exercise and cardiac rehab will be addressed by the presenter. The purpose of this lesson is to promote a comprehensive and effective weekly exercise routine in order to improve patients' overall level of fitness.   Managing Heart Disease: Your Path to a Healthier Heart Clinical staff led group instruction and group discussion with PowerPoint presentation and patient guidebook. To enhance the learning environment the use of posters, models and videos may be added.At the conclusion of this workshop, patients will understand the anatomy and physiology of the heart. Additionally, they will understand how Pritikin's three pillars impact the risk factors, the progression, and the management of heart disease.  The purpose of this lesson is to provide a high-level overview of the heart, heart disease, and how the Pritikin lifestyle positively impacts risk factors.  Exercise Biomechanics Clinical staff led group instruction and group discussion with PowerPoint presentation and patient guidebook. To enhance the learning environment the use of posters, models and videos may be added. Patients will learn how the structural parts of their bodies function and how these functions impact their daily activities, movement, and exercise. Patients will learn how to promote a neutral spine, learn how to manage pain, and identify ways to improve their physical movement in order to promote healthy living. The purpose of this lesson is to expose patients to common physical limitations that impact physical activity. Participants will learn practical ways  to adapt and manage aches and pains, and to minimize their effect on regular exercise. Patients will learn how to maintain good posture while sitting, walking, and lifting.  Balance Training and Fall Prevention  Clinical staff led group instruction and group discussion with PowerPoint presentation and patient guidebook. To enhance the learning environment the use of posters, models and videos may be added. At the conclusion of this workshop, patients will understand the importance of their sensorimotor skills (vision, proprioception, and the vestibular system) in maintaining their ability to balance as they age. Patients will apply a variety of balancing exercises that are appropriate for their current level of function. Patients will understand the common causes for poor balance, possible solutions to these problems, and ways to modify their physical environment in order to minimize their fall risk. The purpose of this lesson is to teach patients about the importance of maintaining balance as they age and ways to minimize their risk of falling.  WORKSHOPS   Nutrition:  Fueling a Ship broker led group instruction and group discussion with PowerPoint presentation and patient guidebook. To enhance the learning environment the use of posters, models and videos may be added. Patients will review the foundational principles of the Pritikin Eating Plan and understand what constitutes a serving size in each of the food groups. Patients will also learn Pritikin-friendly foods that are better choices when away from home and review make-ahead meal and snack options. Calorie density will be reviewed and applied to three nutrition priorities: weight maintenance, weight loss, and weight gain. The purpose of this lesson is to reinforce (in a group setting) the key concepts around what patients are recommended to eat and how to apply these guidelines when away from home by planning and selecting  Pritikin-friendly options. Patients will understand how calorie density may be adjusted for different weight management goals.  Mindful Eating  Clinical staff led group instruction and group discussion with PowerPoint presentation and patient guidebook. To enhance the learning environment the use of posters, models and videos may be added. Patients will briefly review  the concepts of the Pritikin Eating Plan and the importance of low-calorie dense foods. The concept of mindful eating will be introduced as well as the importance of paying attention to internal hunger signals. Triggers for non-hunger eating and techniques for dealing with triggers will be explored. The purpose of this lesson is to provide patients with the opportunity to review the basic principles of the Pritikin Eating Plan, discuss the value of eating mindfully and how to measure internal cues of hunger and fullness using the Hunger Scale. Patients will also discuss reasons for non-hunger eating and learn strategies to use for controlling emotional eating.  Targeting Your Nutrition Priorities Clinical staff led group instruction and group discussion with PowerPoint presentation and patient guidebook. To enhance the learning environment the use of posters, models and videos may be added. Patients will learn how to determine their genetic susceptibility to disease by reviewing their family history. Patients will gain insight into the importance of diet as part of an overall healthy lifestyle in mitigating the impact of genetics and other environmental insults. The purpose of this lesson is to provide patients with the opportunity to assess their personal nutrition priorities by looking at their family history, their own health history and current risk factors. Patients will also be able to discuss ways of prioritizing and modifying the Pritikin Eating Plan for their highest risk areas  Menu  Clinical staff led group instruction and group  discussion with PowerPoint presentation and patient guidebook. To enhance the learning environment the use of posters, models and videos may be added. Using menus brought in from [employer], or printed from [employer], patients will apply the Pritikin dining out guidelines that were presented in the [employer] video. Patients will also be able to practice these guidelines in a variety of provided scenarios. The purpose of this lesson is to provide patients with the opportunity to practice hands-on learning of the Pritikin Dining Out guidelines with actual menus and practice scenarios.  Label Reading Clinical staff led group instruction and group discussion with PowerPoint presentation and patient guidebook. To enhance the learning environment the use of posters, models and videos may be added. Patients will review and discuss the Pritikin label reading guidelines presented in Pritikin's Label Reading Educational series video. Using fool labels brought in from local grocery stores and markets, patients will apply the label reading guidelines and determine if the packaged food meet the Pritikin guidelines. The purpose of this lesson is to provide patients with the opportunity to review, discuss, and practice hands-on learning of the Pritikin Label Reading guidelines with actual packaged food labels. Cooking School  Pritikin's [employer] are designed to teach patients ways to prepare quick, simple, and affordable recipes at home. The importance of nutrition's role in chronic disease risk reduction is reflected in its emphasis in the overall Pritikin program. By learning how to prepare essential core Pritikin Eating Plan recipes, patients will increase control over what they eat; be able to customize the flavor of foods without the use of added salt, sugar, or fat; and improve the quality of the food they consume. By learning a set of core recipes which are easily  assembled, quickly prepared, and affordable, patients are more likely to prepare more healthy foods at home. These workshops focus on convenient breakfasts, simple entres, side dishes, and desserts which can be prepared with minimal effort and are consistent with nutrition recommendations for cardiovascular risk reduction. Cooking [employer] are taught by a Investment banker, operational  or registered dietitian (RD) who has been trained by the [employer] team. The chef or RD has a clear understanding of the importance of minimizing - if not completely eliminating - added fat, sugar, and sodium in recipes. Throughout the series of Cooking School Workshop sessions, patients will learn about healthy ingredients and efficient methods of cooking to build confidence in their capability to prepare    Cooking School weekly topics:  Adding Flavor- Sodium-Free  Fast and Healthy Breakfasts  Powerhouse Plant-Based Proteins  Satisfying Salads and Dressings  Simple Sides and Sauces  International Cuisine-Spotlight on the [employer] Zones  Delicious Desserts  Savory Soups  [employer] - Meals in a Astronomer Appetizers and Snacks  Comforting Weekend Breakfasts  One-Pot Wonders   Fast Evening Meals  Landscape architect Your [facility] Plate  WORKSHOPS   Healthy Mindset (Psychosocial):  Focused Goals, Sustainable Changes Clinical staff led group instruction and group discussion with PowerPoint presentation and patient guidebook. To enhance the learning environment the use of posters, models and videos may be added. Patients will be able to apply effective goal setting strategies to establish at least one personal goal, and then take consistent, meaningful action toward that goal. They will learn to identify common barriers to achieving personal goals and develop strategies to overcome them. Patients will also gain an understanding of how our mind-set can impact our ability to achieve goals and the  importance of cultivating a positive and growth-oriented mind-set. The purpose of this lesson is to provide patients with a deeper understanding of how to set and achieve personal goals, as well as the tools and strategies needed to overcome common obstacles which may arise along the way.  From Head to Heart: The Power of a Healthy Outlook  Clinical staff led group instruction and group discussion with PowerPoint presentation and patient guidebook. To enhance the learning environment the use of posters, models and videos may be added. Patients will be able to recognize and describe the impact of emotions and mood on physical health. They will discover the importance of self-care and explore self-care practices which may work for them. Patients will also learn how to utilize the 4 C's to cultivate a healthier outlook and better manage stress and challenges. The purpose of this lesson is to demonstrate to patients how a healthy outlook is an essential part of maintaining good health, especially as they continue their cardiac rehab journey.  Healthy Sleep for a Healthy Heart Clinical staff led group instruction and group discussion with PowerPoint presentation and patient guidebook. To enhance the learning environment the use of posters, models and videos may be added. At the conclusion of this workshop, patients will be able to demonstrate knowledge of the importance of sleep to overall health, well-being, and quality of life. They will understand the symptoms of, and treatments for, common sleep disorders. Patients will also be able to identify daytime and nighttime behaviors which impact sleep, and they will be able to apply these tools to help manage sleep-related challenges. The purpose of this lesson is to provide patients with a general overview of sleep and outline the importance of quality sleep. Patients will learn about a few of the most common sleep disorders. Patients will also be introduced to the  concept of "sleep hygiene," and discover ways to self-manage certain sleeping problems through simple daily behavior changes. Finally, the workshop will motivate patients by clarifying the links between quality sleep and their goals of heart-healthy living.  Recognizing and Reducing Stress Clinical staff led group instruction and group discussion with PowerPoint presentation and patient guidebook. To enhance the learning environment the use of posters, models and videos may be added. At the conclusion of this workshop, patients will be able to understand the types of stress reactions, differentiate between acute and chronic stress, and recognize the impact that chronic stress has on their health. They will also be able to apply different coping mechanisms, such as reframing negative self-talk. Patients will have the opportunity to practice a variety of stress management techniques, such as deep abdominal breathing, progressive muscle relaxation, and/or guided imagery.  The purpose of this lesson is to educate patients on the role of stress in their lives and to provide healthy techniques for coping with it.  Learning Barriers/Preferences:  Learning Barriers/Preferences - 11/30/23 1105       Learning Barriers/Preferences   Learning Barriers Sight;Exercise Concerns   Wears reading glasses. Has pain in right shoulder and has a cyst on her mid  lower back   Learning Preferences Skilled Demonstration          Education Topics:  Knowledge Questionnaire Score:  Knowledge Questionnaire Score - 11/30/23 1237       Knowledge Questionnaire Score   Pre Score 22/24          Core Components/Risk Factors/Patient Goals at Admission:  Personal Goals and Risk Factors at Admission - 11/30/23 1036       Core Components/Risk Factors/Patient Goals on Admission    Weight Management Yes;Obesity;Weight Loss    Intervention Weight Management/Obesity: Establish reasonable short term and long term weight  goals.;Obesity: Provide education and appropriate resources to help participant work on and attain dietary goals.    Admit Weight 203 lb 7.8 oz (92.3 kg)    Expected Outcomes Short Term: Continue to assess and modify interventions until short term weight is achieved;Long Term: Adherence to nutrition and physical activity/exercise program aimed toward attainment of established weight goal;Weight Loss: Understanding of general recommendations for a balanced deficit meal plan, which promotes 1-2 lb weight loss per week and includes a negative energy balance of [phone] kcal/d    Hypertension Yes    Intervention Provide education on lifestyle modifcations including regular physical activity/exercise, weight management, moderate sodium restriction and increased consumption of fresh fruit, vegetables, and low fat dairy, alcohol moderation, and smoking cessation.;Monitor prescription use compliance.    Expected Outcomes Short Term: Continued assessment and intervention until BP is < 140/69mm HG in hypertensive participants. < 130/19mm HG in hypertensive participants with diabetes, heart failure or chronic kidney disease.;Long Term: Maintenance of blood pressure at goal levels.    Lipids Yes    Intervention Provide education and support for participant on nutrition & aerobic/resistive exercise along with prescribed medications to achieve LDL 70mg , HDL >40mg .    Expected Outcomes Short Term: Participant states understanding of desired cholesterol values and is compliant with medications prescribed. Participant is following exercise prescription and nutrition guidelines.;Long Term: Cholesterol controlled with medications as prescribed, with individualized exercise RX and with personalized nutrition plan. Value goals: LDL < 70mg , HDL > 40 mg.          Core Components/Risk Factors/Patient Goals Review:   Goals and Risk Factor Review     Row Name 12/06/23 1718 12/28/23 1410           Core Components/Risk  Factors/Patient Goals Review   Personal Goals Review Weight Management/Obesity;Hypertension;Lipids Weight Management/Obesity;Hypertension;Lipids      Review [name] started cardiac rehab on  12/06/23. [name] did well with exercise. vital signs were stable. Charlies is doing well with exercise at cardiac rehab.  vital signs have been stable. Charlies has increased her work loads      Expected Outcomes Reneee will continue to participate in cardiac rehab for exercise, nutrition and lifestyle modifications. Charlies will continue to participate in cardiac rehab for exercise, nutrition and lifestyle modifications.         Core Components/Risk Factors/Patient Goals at Discharge (Final Review):   Goals and Risk Factor Review - 12/28/23 1410       Core Components/Risk Factors/Patient Goals Review   Personal Goals Review Weight Management/Obesity;Hypertension;Lipids    Review Charlies is doing well with exercise at cardiac rehab.  vital signs have been stable. Charlies has increased her work loads    Expected Outcomes [name] will continue to participate in cardiac rehab for exercise, nutrition and lifestyle modifications.          ITP Comments:  ITP Comments     Row Name 11/30/23 1059 12/06/23 1715 12/28/23 1406       ITP Comments Introduction to [facility] Education Program/Intensive Cardiac Rehab. Initial Orientation Packet reviewed with the patient 30 Day ITP Review. [name] started cardiac rehab on 12/06/23. [name] did well with exercise. 30 Day ITP Review. Charlies has good attendance and participation with exercise at cardiac rehab.        Comments: See ITP Comments

## 2023-12-29 ENCOUNTER — Encounter (HOSPITAL_COMMUNITY): Admission: RE | Admit: 2023-12-29 | Source: Ambulatory Visit

## 2023-12-29 ENCOUNTER — Telehealth (HOSPITAL_COMMUNITY): Payer: Self-pay

## 2023-12-29 NOTE — Telephone Encounter (Signed)
 Called patient regarding no call, no show for 10:15 CR class. Patient states she called and left a message this morning, unsure of what number she called as the front desk did not receive the message. She states she is a little sore from 'something other than cardiac rehab class' but okay.

## 2023-12-30 ENCOUNTER — Telehealth (HOSPITAL_COMMUNITY): Payer: Self-pay

## 2023-12-30 NOTE — Telephone Encounter (Signed)
 Patient called requesting to come tomorrow for 10:15 CR since she had to miss her class on Wednesday (she is normally [facility] only). Added her to the schedule for 8/15.

## 2023-12-31 ENCOUNTER — Encounter (HOSPITAL_COMMUNITY)

## 2023-12-31 ENCOUNTER — Encounter (HOSPITAL_COMMUNITY)
Admission: RE | Admit: 2023-12-31 | Discharge: 2023-12-31 | Disposition: A | Discharge status: Home / Self Care | Source: Ambulatory Visit | Attending: Cardiology | Admitting: Cardiology

## 2023-12-31 DIAGNOSIS — Z955 Presence of coronary angioplasty implant and graft: Secondary | ICD-10-CM | POA: Diagnosis not present

## 2024-01-03 ENCOUNTER — Encounter (HOSPITAL_COMMUNITY)
Admission: RE | Admit: 2024-01-03 | Discharge: 2024-01-03 | Disposition: A | Discharge status: Home / Self Care | Source: Ambulatory Visit | Attending: Cardiology

## 2024-01-03 DIAGNOSIS — Z955 Presence of coronary angioplasty implant and graft: Secondary | ICD-10-CM

## 2024-01-05 ENCOUNTER — Encounter (HOSPITAL_COMMUNITY)

## 2024-01-07 ENCOUNTER — Encounter (HOSPITAL_COMMUNITY)

## 2024-01-10 ENCOUNTER — Encounter (HOSPITAL_COMMUNITY)
Admission: RE | Admit: 2024-01-10 | Discharge: 2024-01-10 | Disposition: A | Discharge status: Home / Self Care | Source: Ambulatory Visit | Attending: Cardiology | Admitting: Cardiology

## 2024-01-10 DIAGNOSIS — Z955 Presence of coronary angioplasty implant and graft: Secondary | ICD-10-CM | POA: Diagnosis not present

## 2024-01-10 NOTE — Progress Notes (Signed)
 Reviewed home exercise guidelines with [name] including endpoints, temperature precautions, target heart rate and rate of perceived exertion. Morgan Wolfe is currently walking 10 minutes daily and doing chair yoga 60 minutes on Tuesdays as her mode of home exercise. Discussed gradually increasing walking duration as tolerated. [name] voices understanding of instructions given.  Arnoldo CHRISTELLA Gal, MS, ACSM CEP

## 2024-01-11 ENCOUNTER — Ambulatory Visit: Admitting: Podiatry

## 2024-01-11 ENCOUNTER — Ambulatory Visit (INDEPENDENT_AMBULATORY_CARE_PROVIDER_SITE_OTHER)

## 2024-01-11 DIAGNOSIS — G8929 Other chronic pain: Secondary | ICD-10-CM

## 2024-01-11 DIAGNOSIS — Q666 Other congenital valgus deformities of feet: Secondary | ICD-10-CM

## 2024-01-11 DIAGNOSIS — M7752 Other enthesopathy of left foot: Secondary | ICD-10-CM

## 2024-01-11 DIAGNOSIS — M899 Disorder of bone, unspecified: Secondary | ICD-10-CM

## 2024-01-11 DIAGNOSIS — M949 Disorder of cartilage, unspecified: Secondary | ICD-10-CM

## 2024-01-11 DIAGNOSIS — M25572 Pain in left ankle and joints of left foot: Secondary | ICD-10-CM

## 2024-01-11 NOTE — Progress Notes (Unsigned)
 Patient has [employer] and [employer] we will see if Donzella will pick up L3020 if so patient will proceed if not will look at other options  Impressions taken today  Orthotics   Patient was present and evaluated for Custom molded foot orthotics. Patient will benefit from CFO's to provide total contact to BIL MLA's helping to balance and distribute body weight more evenly across BIL feet helping to reduce plantar pressure and pain. Orthotic will also encourage FF / RF alignment  Patient was scanned today and will return for fitting upon receipt  L1902 not dispensed patient states she has same style figure 8 ankle brace at home

## 2024-01-11 NOTE — Progress Notes (Signed)
  Subjective:  Patient ID: Morgan Wolfe, female    DOB: 28-Oct-1955,  MRN: [id number]  Chief Complaint  Patient presents with   Foot Pain    Left foot lateral/dorsal foot pain x 2 months.. 9 pain walking. Ace wrap for support. Non diabetic.    Discussed the use of AI scribe software for clinical note transcription with the patient, who gave verbal consent to proceed.  History of Present Illness Morgan Wolfe is a 68 year old female who presents with persistent pain and swelling in the right foot and ankle following previous reconstructive surgeries.  She experiences persistent pain and swelling on the lateral aspect of her right foot and ankle since reconstructive surgery a few years ago. The pain has not resolved despite two additional surgeries, the last in 2020. The pain is intermittent, worsens with prolonged standing or walking, and is not time-specific. She has not sought recent treatment, managing the condition on her own, and believes it might be a long-term issue.  There is no relief following the last surgical intervention. No recent trauma or specific incidents exacerbate the condition. She has a flat foot, which she believes might exacerbate her symptoms, although she does not think it is the primary cause of her pain. She uses supportive shoes to help manage her condition.      Objective:    Physical Exam General: AAO x3, NAD  Dermatological: Skin is warm, dry and supple bilateral. There are no open sores, no preulcerative lesions, no rash or signs of infection present.  Vascular: Dorsalis Pedis artery and Posterior Tibial artery pedal pulses are 2/4 bilateral with immedate capillary fill time.  There is no pain with calf compression, swelling, warmth, erythema.   Neruologic: Grossly intact via light touch bilateral.   Musculoskeletal: There is a decrease in medial arch on weightbearing.  She has tenderness on the anterolateral ankle joint as well as the sinus  tarsi.  There are some localized edema but there is no erythema.  Flexor, extensor tendons intact.  MMT 5/5.  No area of pinpoint tenderness.  Gait: Unassisted, Nonantalgic.     No images are attached to the encounter.    Results RADIOLOGY Ankle X-ray: Narrowing of the subtalar joint space, possible arthritis. Ankle joint appears normal.  DIAGNOSTIC Ankle Arthroscopy: Osteochondral lesion in the ankle joint cartilage. (2020)   Assessment:   1. Osteochondral lesion   2. Pes planovalgus   3. Capsulitis of left ankle      Plan:  Patient was evaluated and treated and all questions answered.  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 X-rays confirmed subtalar joint narrowing. She preferred non-invasive management over surgery. Declined steroid injections. - Consider MRI for detailed joint assessment. - Recommend icing to reduce inflammation. - Advise Voltaren  gel for topical anti-inflammatory use due to cardiac history. - Recommend custom orthotic inserts to alleviate joint pressure. - Consult pedorthist for orthotic fitting.  Seen today by Lolita. - Chronic ankle brace was dispensed helps stabilize the ankle to help facilitate healing and gait.  Right flat foot (pes planus) Flat foot exacerbates symptoms by increasing joint pressure and inflammation, worsening osteoarthritis. - Recommend custom orthotic inserts to support arch and reduce joint pressure. - Consult pedorthist for orthotic fitting.  Donnice JONELLE Fees DPM

## 2024-01-12 ENCOUNTER — Encounter (HOSPITAL_COMMUNITY)
Admission: RE | Admit: 2024-01-12 | Discharge: 2024-01-12 | Disposition: A | Discharge status: Home / Self Care | Source: Ambulatory Visit | Attending: Cardiology | Admitting: Cardiology

## 2024-01-12 DIAGNOSIS — Z955 Presence of coronary angioplasty implant and graft: Secondary | ICD-10-CM | POA: Diagnosis not present

## 2024-01-14 ENCOUNTER — Encounter (HOSPITAL_COMMUNITY)

## 2024-01-19 ENCOUNTER — Encounter (HOSPITAL_COMMUNITY)

## 2024-01-21 ENCOUNTER — Encounter (HOSPITAL_COMMUNITY)

## 2024-01-24 ENCOUNTER — Encounter (HOSPITAL_COMMUNITY)
Admission: RE | Admit: 2024-01-24 | Discharge: 2024-01-24 | Disposition: A | Discharge status: Home / Self Care | Source: Ambulatory Visit | Attending: Cardiology | Admitting: Cardiology

## 2024-01-24 ENCOUNTER — Other Ambulatory Visit (HOSPITAL_BASED_OUTPATIENT_CLINIC_OR_DEPARTMENT_OTHER): Payer: Self-pay | Admitting: Family Medicine

## 2024-01-24 DIAGNOSIS — Z955 Presence of coronary angioplasty implant and graft: Secondary | ICD-10-CM | POA: Insufficient documentation

## 2024-01-24 DIAGNOSIS — G8929 Other chronic pain: Secondary | ICD-10-CM

## 2024-01-24 NOTE — Progress Notes (Signed)
 Cardiac Individual Treatment Plan  Patient Details  Name: Morgan Wolfe MRN: [id number] Date of Birth: 06-17-1955 Referring Provider:   Flowsheet Row INTENSIVE CARDIAC REHAB ORIENT from 11/30/2023 in [facility]  Referring Provider Anner Alm ORN, MD    Initial Encounter Date:  Flowsheet Row INTENSIVE CARDIAC REHAB ORIENT from 11/30/2023 in [facility] Heart, Vascular, & Lung Health  Date 11/30/23    Visit Diagnosis: 10/20/23 S/P DES mRCA, S/P DES CTO mLAD  Patient's Home Medications on Admission:  Current Outpatient Medications:    allopurinol  (ZYLOPRIM ) 100 MG tablet, Take 1 tablet (100 mg total) by mouth daily. (Patient taking differently: Take 100 mg by mouth daily as needed.), Disp: 30 tablet, Rfl: 1   amLODipine  (NORVASC ) 5 MG tablet, Take 1 tablet (5 mg total) by mouth daily., Disp: 90 tablet, Rfl: 3   aspirin  81 MG chewable tablet, Chew 1 tablet (81 mg total) by mouth daily., Disp: 90 tablet, Rfl: 1   cholecalciferol (VITAMIN D3) 25 MCG (1000 UNIT) tablet, Take 1,000 Units by mouth daily., Disp: , Rfl:    clopidogrel  (PLAVIX ) 75 MG tablet, Take 1 tablet (75 mg total) by mouth daily with breakfast., Disp: 90 tablet, Rfl: 3   fluticasone  (FLONASE ) 50 MCG/ACT nasal spray, SHAKE LIQUID AND USE 2 SPRAYS IN EACH NOSTRIL DAILY, Disp: 16 g, Rfl: 5   loratadine  (CLARITIN ) 10 MG tablet, Take 1 tablet (10 mg total) by mouth daily as needed for allergies., Disp: 30 tablet, Rfl: 1   losartan  (COZAAR ) 100 MG tablet, Take 1 tablet (100 mg total) by mouth every morning., Disp: 90 tablet, Rfl: 3   metoprolol  tartrate (LOPRESSOR ) 50 MG tablet, Take 1 tablet (50 mg total) by mouth 2 (two) times daily., Disp: 180 tablet, Rfl: 3   rosuvastatin  (CRESTOR ) 40 MG tablet, Take 1 tablet (40 mg total) by mouth daily., Disp: 90 tablet, Rfl: 3  Past Medical History: Past Medical History:  Diagnosis Date   ALLERGIC  RHINITIS    Coronary artery disease    Hypertension    PONV (postoperative nausea and vomiting)    Right shoulder pain     Tobacco Use: Social History   Tobacco Use  Smoking Status Never  Smokeless Tobacco Never    Labs: Review Flowsheet  More data exists      Latest Ref Rng & Units 02/06/2015 01/04/2017 01/10/2018 10/28/2020 11/18/2023  Labs for ITP Cardiac and Pulmonary Rehab  Cholestrol 100 - 199 mg/dL 820  814  818  815  881   LDL (calc) 0 - 99 mg/dL 84  - 85  99  41   Direct LDL mg/dL - 40.9  - - -  HDL-C >60 mg/dL 38.19  38.59  39.09  48.49  48   Trlycerides 0 - 149 mg/dL 837.9  759.9  825.9  833.9  179     Capillary Blood Glucose: No results found for: GLUCAP   Exercise Target Goals: Exercise Program Goal: Individual exercise prescription set using results from initial 6 min walk test and THRR while considering  patient's activity barriers and safety.   Exercise Prescription Goal: Initial exercise prescription builds to 30-45 minutes a day of aerobic activity, 2-3 days per week.  Home exercise guidelines will be given to patient during program as part of exercise prescription that the participant will acknowledge.  Activity Barriers & Risk Stratification:  Activity Barriers & Cardiac Risk Stratification - 11/30/23 1036  Activity Barriers & Cardiac Risk Stratification   Activity Barriers Other (comment)    Comments Right rotator cuff problem. Left ankle reconstruction.    Cardiac Risk Stratification Low          6 Minute Walk:  6 Minute Walk     Row Name 11/30/23 1052         6 Minute Walk   Phase Initial     Distance 1645 feet     Walk Time 6 minutes     # of Rest Breaks 0     MPH 3.11     METS 3.54     RPE 7     Perceived Dyspnea  0     VO2 Peak 12.4     Symptoms No     Resting HR 58 bpm     Resting BP 110/70     Resting Oxygen Saturation  98 %     Exercise Oxygen Saturation  during 6 min walk 96 %     Max Ex. HR 93 bpm     Max Ex.  BP 130/60     2 Minute Post BP 118/60        Oxygen Initial Assessment:   Oxygen Re-Evaluation:   Oxygen Discharge (Final Oxygen Re-Evaluation):   Initial Exercise Prescription:  Initial Exercise Prescription - 11/30/23 1200       Date of Initial Exercise RX and Referring Provider   Date 11/30/23    Referring Provider Anner Alm ORN, MD    Expected Discharge Date 02/25/24      Treadmill   MPH 3    Grade 0    Minutes 15    METs 3.3      NuStep   Level 2    SPM 85    Minutes 15    METs 2.8      Prescription Details   Frequency (times per week) 3    Duration Progress to 30 minutes of continuous aerobic without signs/symptoms of physical distress      Intensity   THRR 40-80% of Max Heartrate 62-124    Ratings of Perceived Exertion 11-13    Perceived Dyspnea 0-4      Progression   Progression Continue to progress workloads to maintain intensity without signs/symptoms of physical distress.      Resistance Training   Training Prescription Yes    Weight 3 lbs    Reps 10-15          Perform Capillary Blood Glucose checks as needed.  Exercise Prescription Changes:   Exercise Prescription Changes     Row Name 12/06/23 1035 12/20/23 1035 01/03/24 1034 01/12/24 1016 01/24/24 1029     Response to Exercise   Blood Pressure (Admit) 130/72 108/66 108/64 112/62 118/62   Blood Pressure (Exercise) 138/68 138/64 132/70 -- --   Blood Pressure (Exit) 104/68 118/62 112/72 100/60 98/70   Heart Rate (Admit) 59 bpm 58 bpm 58 bpm 62 bpm 58 bpm   Heart Rate (Exercise) 100 bpm 91 bpm 88 bpm 101 bpm 103 bpm   Heart Rate (Exit) 68 bpm 66 bpm 63 bpm 69 bpm 67 bpm   Rating of Perceived Exertion (Exercise) 11 11 12 12 12    Symptoms None None None None None   Comments Off to a good start with exercise. -- Increased workload on TM to 2.2/0. Increased hand weights today from 3 to 4 lbs. Increased workload on the treadmill. --   Duration Continue with 30 min of  aerobic exercise  without signs/symptoms of physical distress. Continue with 30 min of aerobic exercise without signs/symptoms of physical distress. Continue with 30 min of aerobic exercise without signs/symptoms of physical distress. Continue with 30 min of aerobic exercise without signs/symptoms of physical distress. Continue with 30 min of aerobic exercise without signs/symptoms of physical distress.   Intensity THRR unchanged THRR unchanged THRR unchanged THRR unchanged THRR unchanged     Progression   Progression Continue to progress workloads to maintain intensity without signs/symptoms of physical distress. Continue to progress workloads to maintain intensity without signs/symptoms of physical distress. Continue to progress workloads to maintain intensity without signs/symptoms of physical distress. Continue to progress workloads to maintain intensity without signs/symptoms of physical distress. Continue to progress workloads to maintain intensity without signs/symptoms of physical distress.   Average METs 2.1 2.4 2.6 3 3.1     Resistance Training   Training Prescription Yes Yes Yes No Yes   Weight 3 lbs 3 lbs 4 lbs Relaxation day, no weights. 4 lbs   Reps 10-15 10-15 10-15 -- 10-15   Time 5 Minutes 5 Minutes 5 Minutes -- 5 Minutes     Interval Training   Interval Training No No No No No     Treadmill   MPH 1.7 1.7 2.2 2.8 2.8   Grade 0 0 0 0 0   Minutes 15 15 15 15 15    METs 2.3 2.3 2.69 3.14 3.14     NuStep   Level 2 2 2 2 4    SPM 78 91 92 88 97   Minutes 15 15 15 15 15    METs 2 2.3 2.6 2.8 3     Home Exercise Plan   Plans to continue exercise at -- -- -- Home (comment)  Walking, yoga Home (comment)  Walking, yoga   Frequency -- -- -- Add 4 additional days to program exercise sessions. Add 4 additional days to program exercise sessions.   Initial Home Exercises Provided -- -- -- 01/10/24 01/10/24      Exercise Comments:   Exercise Comments     Row Name 12/06/23 1135 12/24/23 1140  01/10/24 1116       Exercise Comments [name] tolerated low intensity exercise well without symptoms. Oriented her to the equipement and the warm-up and cool-down stretches. Reviewed METs and goals with [name]. Reviewed home exercise guidelines and goals with [name].        Exercise Goals and Review:   Exercise Goals     Row Name 11/30/23 1036             Exercise Goals   Increase Physical Activity Yes       Intervention Provide advice, education, support and counseling about physical activity/exercise needs.;Develop an individualized exercise prescription for aerobic and resistive training based on initial evaluation findings, risk stratification, comorbidities and participant's personal goals.       Expected Outcomes Long Term: Exercising regularly at least 3-5 days a week.;Long Term: Add in home exercise to make exercise part of routine and to increase amount of physical activity.;Short Term: Attend rehab on a regular basis to increase amount of physical activity.       Increase Strength and Stamina Yes       Intervention Provide advice, education, support and counseling about physical activity/exercise needs.;Develop an individualized exercise prescription for aerobic and resistive training based on initial evaluation findings, risk stratification, comorbidities and participant's personal goals.       Expected Outcomes Short Term: Increase  workloads from initial exercise prescription for resistance, speed, and METs.;Short Term: Perform resistance training exercises routinely during rehab and add in resistance training at home;Long Term: Improve cardiorespiratory fitness, muscular endurance and strength as measured by increased METs and functional capacity ( )       Able to understand and use rate of perceived exertion (RPE) scale Yes       Intervention Provide education and explanation on how to use RPE scale       Expected Outcomes Short Term: Able to use RPE daily in rehab to express  subjective intensity level;Long Term:  Able to use RPE to guide intensity level when exercising independently       Knowledge and understanding of Target Heart Rate Range (THRR) Yes       Intervention Provide education and explanation of THRR including how the numbers were predicted and where they are located for reference       Expected Outcomes Short Term: Able to state/look up THRR;Long Term: Able to use THRR to govern intensity when exercising independently;Short Term: Able to use daily as guideline for intensity in rehab       Able to check pulse independently Yes       Intervention Provide education and demonstration on how to check pulse in carotid and radial arteries.;Review the importance of being able to check your own pulse for safety during independent exercise       Expected Outcomes Short Term: Able to explain why pulse checking is important during independent exercise;Long Term: Able to check pulse independently and accurately       Understanding of Exercise Prescription Yes       Intervention Provide education, explanation, and written materials on patient's individual exercise prescription       Expected Outcomes Short Term: Able to explain program exercise prescription;Long Term: Able to explain home exercise prescription to exercise independently          Exercise Goals Re-Evaluation :  Exercise Goals Re-Evaluation     Row Name 12/06/23 1135 12/24/23 1140 01/10/24 1116         Exercise Goal Re-Evaluation   Exercise Goals Review Increase Physical Activity;Increase Strength and Stamina;Able to understand and use rate of perceived exertion (RPE) scale Increase Physical Activity;Increase Strength and Stamina;Able to understand and use rate of perceived exertion (RPE) scale Increase Physical Activity;Increase Strength and Stamina;Able to understand and use rate of perceived exertion (RPE) scale;Able to check pulse independently;Understanding of Exercise Prescription;Knowledge and  understanding of Target Heart Rate Range (THRR)     Comments [name] was able to understand and use RPE scale appropriately. Charlies is making good progess with exercise thus far. She is doing chair yoga on Tuesdays in addition to exercise at cardiac rehab. Reviewed exercise prescription with [name]. She is walking and doing chair yoga. She has a smart watch she can use to monitor her pulse.     Expected Outcomes Progress workloads as tolerated to help increase strength and stamina. Continue yoga, progess workloads as tolerated. Continue daily exercise to improve cardiorespiratory fitness.        Discharge Exercise Prescription (Final Exercise Prescription Changes):  Exercise Prescription Changes - 01/24/24 1029       Response to Exercise   Blood Pressure (Admit) 118/62    Blood Pressure (Exit) 98/70    Heart Rate (Admit) 58 bpm    Heart Rate (Exercise) 103 bpm    Heart Rate (Exit) 67 bpm    Rating of Perceived Exertion (Exercise) 12  Symptoms None    Duration Continue with 30 min of aerobic exercise without signs/symptoms of physical distress.    Intensity THRR unchanged      Progression   Progression Continue to progress workloads to maintain intensity without signs/symptoms of physical distress.    Average METs 3.1      Resistance Training   Training Prescription Yes    Weight 4 lbs    Reps 10-15    Time 5 Minutes      Interval Training   Interval Training No      Treadmill   MPH 2.8    Grade 0    Minutes 15    METs 3.14      NuStep   Level 4    SPM 97    Minutes 15    METs 3      Home Exercise Plan   Plans to continue exercise at Home (comment)   Walking, yoga   Frequency Add 4 additional days to program exercise sessions.    Initial Home Exercises Provided 01/10/24          Nutrition:  Target Goals: Understanding of nutrition guidelines, daily intake of sodium 1500mg , cholesterol 200mg , calories 30% from fat and 7% or less from saturated fats, daily to have  5 or more servings of fruits and vegetables.  Biometrics:  Pre Biometrics - 11/30/23 1058       Pre Biometrics   Waist Circumference 39.25 inches    Hip Circumference 46 inches    Waist to Hip Ratio 0.85 %    Triceps Skinfold 44 mm    % Body Fat 43.5 %    Grip Strength 20 kg    Flexibility 17.38 in    Single Leg Stand 20 seconds           Nutrition Therapy Plan and Nutrition Goals:  Nutrition Therapy & Goals - 01/10/24 1119       Nutrition Therapy   Diet Heart Healthy Diet    Drug/Food Interactions Statins/Certain Fruits      Personal Nutrition Goals   Nutrition Goal Patient to identify strategies for reducing cardiovascular risk by attending the [facility] education and nutrition series weekly.   goal not met.   Personal Goal #2 Patient to improve diet quality by using the plate method as a guide for meal planning to include lean protein/plant protein, fruits, vegetables, whole grains, nonfat dairy as part of a well-balanced diet.    Comments Goals in progress. [name] has medical history of HTN, hyperlipidemia, CAD s/p coronary artery stent placement. LDL is at goal. Triglycerides remain elevated. She is motivated to lose weight; will continue to discuss strategies for weight loss including calorie density, label reading, the plate method as a guide for meal planning,etc. She does not attend the [facility] education/nutrition series regularly. She has maintained her weight since starting with our program. Patient will benefit from participation in intensive cardiac rehab for nutrition education, exercise, and lifestyle modification.      Intervention Plan   Intervention Prescribe, educate and counsel regarding individualized specific dietary modifications aiming towards targeted core components such as weight, hypertension, lipid management, diabetes, heart failure and other comorbidities.;Nutrition handout(s) given to patient.    Expected Outcomes Short Term Goal: Understand basic  principles of dietary content, such as calories, fat, sodium, cholesterol and nutrients.;Long Term Goal: Adherence to prescribed nutrition plan.          Nutrition Assessments:  Nutrition Assessments - 12/02/23 1103  Rate Your Plate Scores   Pre Score 54         MEDIFICTS Score Key: >=70 Need to make dietary changes  40-70 Heart Healthy Diet <= 40 Therapeutic Level Cholesterol Diet   Flowsheet Row INTENSIVE CARDIAC REHAB ORIENT from 11/30/2023 in [facility] Heart, Vascular, & Lung [facility]  Picture Your Plate Total Score on Admission 54   Picture Your Plate Scores: <59 Unhealthy dietary pattern with much room for improvement. 41-50 Dietary pattern unlikely to meet recommendations for good health and room for improvement. 51-60 More healthful dietary pattern, with some room for improvement.  >60 Healthy dietary pattern, although there may be some specific behaviors that could be improved.    Nutrition Goals Re-Evaluation:  Nutrition Goals Re-Evaluation     Row Name 12/06/23 1146 01/10/24 1119           Goals   Current Weight 203 lb 7.8 oz (92.3 kg) 203 lb 7.8 oz (92.3 kg)      Comment LDL 41, triglycerides 179, HDL 48 no new labs; most recent labs  LDL 41, triglycerides 179, HDL 48      Expected Outcome [name] has medical history of HTN, hyperlipidemia, CAD s/p coronary artery stent placement.LDL is at goal. Triglycerides remain elevated. She is motivated to lose weight; will continue to discuss strategies for weight loss including calorie density, label reading, the plate method as a guide for meal planning,etc. Patient will benefit from participation in intensive cardiac rehab for nutrition education, exercise, and lifestyle modification. Goals in progress. [name] has medical history of HTN, hyperlipidemia, CAD s/p coronary artery stent placement. LDL is at goal. Triglycerides remain elevated. She is motivated to lose weight; will continue to  discuss strategies for weight loss including calorie density, label reading, the plate method as a guide for meal planning,etc. She does not attend the [facility] education/nutrition series regularly. She has maintained her weight since starting with our program. Patient will benefit from participation in intensive cardiac rehab for nutrition education, exercise, and lifestyle modification.         Nutrition Goals Re-Evaluation:  Nutrition Goals Re-Evaluation     Row Name 12/06/23 1146 01/10/24 1119           Goals   Current Weight 203 lb 7.8 oz (92.3 kg) 203 lb 7.8 oz (92.3 kg)      Comment LDL 41, triglycerides 179, HDL 48 no new labs; most recent labs  LDL 41, triglycerides 179, HDL 48      Expected Outcome [name] has medical history of HTN, hyperlipidemia, CAD s/p coronary artery stent placement.LDL is at goal. Triglycerides remain elevated. She is motivated to lose weight; will continue to discuss strategies for weight loss including calorie density, label reading, the plate method as a guide for meal planning,etc. Patient will benefit from participation in intensive cardiac rehab for nutrition education, exercise, and lifestyle modification. Goals in progress. [name] has medical history of HTN, hyperlipidemia, CAD s/p coronary artery stent placement. LDL is at goal. Triglycerides remain elevated. She is motivated to lose weight; will continue to discuss strategies for weight loss including calorie density, label reading, the plate method as a guide for meal planning,etc. She does not attend the [facility] education/nutrition series regularly. She has maintained her weight since starting with our program. Patient will benefit from participation in intensive cardiac rehab for nutrition education, exercise, and lifestyle modification.         Nutrition Goals Discharge (Final Nutrition Goals Re-Evaluation):  Nutrition Goals Re-Evaluation - 01/10/24 1119       Goals   Current Weight 203 lb 7.8 oz  (92.3 kg)    Comment no new labs; most recent labs  LDL 41, triglycerides 179, HDL 48    Expected Outcome Goals in progress. [name] has medical history of HTN, hyperlipidemia, CAD s/p coronary artery stent placement. LDL is at goal. Triglycerides remain elevated. She is motivated to lose weight; will continue to discuss strategies for weight loss including calorie density, label reading, the plate method as a guide for meal planning,etc. She does not attend the [facility] education/nutrition series regularly. She has maintained her weight since starting with our program. Patient will benefit from participation in intensive cardiac rehab for nutrition education, exercise, and lifestyle modification.          Psychosocial: Target Goals: Acknowledge presence or absence of significant depression and/or stress, maximize coping skills, provide positive support system. Participant is able to verbalize types and ability to use techniques and skills needed for reducing stress and depression.  Initial Review & Psychosocial Screening:  Initial Psych Review & Screening - 11/30/23 1101       Initial Review   Current issues with Current Stress Concerns    Source of Stress Concerns Chronic Illness    Comments Charlies denies having depression or anxiety. [name] mention having some health concerns regarding her right shoulder and mid lower back      Family Dynamics   Good Support System? Yes   [name] lives alone. Charlies has a nephew she can rely on for support. Charlies has 2 brothers who live in the area   Comments Charlies says she mainly relies on herself      Barriers   Psychosocial barriers to participate in program The patient should benefit from training in stress management and relaxation.      Screening Interventions   Interventions Encouraged to exercise    Expected Outcomes Long Term Goal: Stressors or current issues are controlled or eliminated.          Quality of Life Scores:  Quality of Life -  11/30/23 1236       Quality of Life   Select Quality of Life      Quality of Life Scores   Health/Function Pre 28.8 %    Socioeconomic Pre 29.64 %    Psych/Spiritual Pre 30 %    Family Pre 20.4 %    GLOBAL Pre 27.99 %         Scores of 19 and below usually indicate a poorer quality of life in these areas.  A difference of  2-3 points is a clinically meaningful difference.  A difference of 2-3 points in the total score of the Quality of Life Index has been associated with significant improvement in overall quality of life, self-image, physical symptoms, and general health in studies assessing change in quality of life.  PHQ-9: Review Flowsheet  More data exists      11/30/2023 07/10/2022 07/15/2021 04/30/2021 11/22/2019  Depression screen PHQ 2/9  Decreased Interest 0 0 3 0 0  Down, Depressed, Hopeless 0 0 0 0 0  PHQ - 2 Score 0 0 3 0 0  Altered sleeping 0 - 3 3 -  Tired, decreased energy 0 - 2 0 -  Change in appetite 0 - 0 0 -  Feeling bad or failure about yourself  0 - 0 0 -  Trouble concentrating 0 - 0 0 -  Moving slowly or  fidgety/restless 0 - 3 0 -  Suicidal thoughts 0 - 0 0 -  PHQ-9 Score 0 - 11 3 -  Difficult doing work/chores Not difficult at all - - - -   Interpretation of Total Score  Total Score Depression Severity:  1-4 = Minimal depression, 5-9 = Mild depression, 10-14 = Moderate depression, 15-19 = Moderately severe depression, 20-27 = Severe depression   Psychosocial Evaluation and Intervention:   Psychosocial Re-Evaluation:  Psychosocial Re-Evaluation     Row Name 12/06/23 1716 12/28/23 1408 01/24/24 1709         Psychosocial Re-Evaluation   Current issues with Current Stress Concerns Current Stress Concerns Current Stress Concerns     Comments [name] did not voice any increased concerns or stressors on her first day of exercise [name] continues not voice any increased concerns or stressors during exercise at cardiac rehab. Charlies continues not voice any  increased concerns or stressors during exercise at cardiac rehab.     Expected Outcomes [name] will have controlled or decreased stressors upon completion of cardiac rehab [name] will have controlled or decreased stressors upon completion of cardiac rehab [name] will have controlled or decreased stressors upon completion of cardiac rehab     Interventions Stress management education;Relaxation education;Encouraged to attend Cardiac Rehabilitation for the exercise Stress management education;Relaxation education;Encouraged to attend Cardiac Rehabilitation for the exercise Stress management education;Relaxation education;Encouraged to attend Cardiac Rehabilitation for the exercise     Continue Psychosocial Services  No Follow up required No Follow up required No Follow up required       Initial Review   Source of Stress Concerns Chronic Illness Chronic Illness Chronic Illness     Comments will continue to monitor and offer support as needed. will continue to monitor and offer support as needed. will continue to monitor and offer support as needed.        Psychosocial Discharge (Final Psychosocial Re-Evaluation):  Psychosocial Re-Evaluation - 01/24/24 1709       Psychosocial Re-Evaluation   Current issues with Current Stress Concerns    Comments Charlies continues not voice any increased concerns or stressors during exercise at cardiac rehab.    Expected Outcomes [name] will have controlled or decreased stressors upon completion of cardiac rehab    Interventions Stress management education;Relaxation education;Encouraged to attend Cardiac Rehabilitation for the exercise    Continue Psychosocial Services  No Follow up required      Initial Review   Source of Stress Concerns Chronic Illness    Comments will continue to monitor and offer support as needed.          Vocational Rehabilitation: Provide vocational rehab assistance to qualifying candidates.   Vocational Rehab Evaluation &  Intervention:  Vocational Rehab - 11/30/23 1104       Initial Vocational Rehab Evaluation & Intervention   Assessment shows need for Vocational Rehabilitation No   Charlies has been retired since 2020 and does not need vocational rehab at this time         Education: Education Goals: Education classes will be provided on a weekly basis, covering required topics. Participant will state understanding/return demonstration of topics presented.    Education     Row Name 12/08/23 1100     Education   Cardiac Education Topics Pritikin   Customer service manager   Weekly Topic Tasty Appetizers and Snacks   Instruction Review Code 1- Verbalizes Understanding   Class Start Time  1145   Class Stop Time 1225   Class Time Calculation (min) 40 min    Row Name 12/15/23 1600     Education   Cardiac Education Topics Pritikin   Customer service manager   Weekly Topic Adding Flavor - Sodium-Free   Instruction Review Code 1- Verbalizes Understanding   Class Start Time 1140   Class Stop Time 1215   Class Time Calculation (min) 35 min      Core Videos: Exercise    Move It!  Clinical staff conducted group or individual video education with verbal and written material and guidebook.  Patient learns the recommended Pritikin exercise program. Exercise with the goal of living a long, healthy life. Some of the health benefits of exercise include controlled diabetes, healthier blood pressure levels, improved cholesterol levels, improved heart and lung capacity, improved sleep, and better body composition. Everyone should speak with their doctor before starting or changing an exercise routine.  Biomechanical Limitations Clinical staff conducted group or individual video education with verbal and written material and guidebook.  Patient learns how biomechanical limitations can impact exercise and how we can mitigate and  possibly overcome limitations to have an impactful and balanced exercise routine.  Body Composition Clinical staff conducted group or individual video education with verbal and written material and guidebook.  Patient learns that body composition (ratio of muscle mass to fat mass) is a key component to assessing overall fitness, rather than body weight alone. Increased fat mass, especially visceral belly fat, can put us  at increased risk for metabolic syndrome, type 2 diabetes, heart disease, and even death. It is recommended to combine diet and exercise (cardiovascular and resistance training) to improve your body composition. Seek guidance from your physician and exercise physiologist before implementing an exercise routine.  Exercise Action Plan Clinical staff conducted group or individual video education with verbal and written material and guidebook.  Patient learns the recommended strategies to achieve and enjoy long-term exercise adherence, including variety, self-motivation, self-efficacy, and positive decision making. Benefits of exercise include fitness, good health, weight management, more energy, better sleep, less stress, and overall well-being.  Medical   Heart Disease Risk Reduction Clinical staff conducted group or individual video education with verbal and written material and guidebook.  Patient learns our heart is our most vital organ as it circulates oxygen, nutrients, white blood cells, and hormones throughout the entire body, and carries waste away. Data supports a plant-based eating plan like the Pritikin Program for its effectiveness in slowing progression of and reversing heart disease. The video provides a number of recommendations to address heart disease.   Metabolic Syndrome and Belly Fat  Clinical staff conducted group or individual video education with verbal and written material and guidebook.  Patient learns what metabolic syndrome is, how it leads to heart disease,  and how one can reverse it and keep it from coming back. You have metabolic syndrome if you have 3 of the following 5 criteria: abdominal obesity, high blood pressure, high triglycerides, low HDL cholesterol, and high blood sugar.  Hypertension and Heart Disease Clinical staff conducted group or individual video education with verbal and written material and guidebook.  Patient learns that high blood pressure, or hypertension, is very common in the [place] . Hypertension is largely due to excessive salt intake, but other important risk factors include being overweight, physical inactivity, drinking too much alcohol, smoking, and not eating enough potassium from fruits and vegetables.  High blood pressure is a leading risk factor for heart attack, stroke, congestive heart failure, dementia, kidney failure, and premature death. Long-term effects of excessive salt intake include stiffening of the arteries and thickening of heart muscle and organ damage. Recommendations include ways to reduce hypertension and the risk of heart disease.  Diseases of Our Time - Focusing on Diabetes Clinical staff conducted group or individual video education with verbal and written material and guidebook.  Patient learns why the best way to stop diseases of our time is prevention, through food and other lifestyle changes. Medicine (such as prescription pills and surgeries) is often only a Band-Aid on the problem, not a long-term solution. Most common diseases of our time include obesity, type 2 diabetes, hypertension, heart disease, and cancer. The Pritikin Program is recommended and has been proven to help reduce, reverse, and/or prevent the damaging effects of metabolic syndrome.  Nutrition   Overview of the Pritikin Eating Plan  Clinical staff conducted group or individual video education with verbal and written material and guidebook.  Patient learns about the Pritikin Eating Plan for disease risk reduction. The  Pritikin Eating Plan emphasizes a wide variety of unrefined, minimally-processed carbohydrates, like fruits, vegetables, whole grains, and legumes. Go, Caution, and Stop food choices are explained. Plant-based and lean animal proteins are emphasized. Rationale provided for low sodium intake for blood pressure control, low added sugars for blood sugar stabilization, and low added fats and oils for coronary artery disease risk reduction and weight management.  Calorie Density  Clinical staff conducted group or individual video education with verbal and written material and guidebook.  Patient learns about calorie density and how it impacts the Pritikin Eating Plan. Knowing the characteristics of the food you choose will help you decide whether those foods will lead to weight gain or weight loss, and whether you want to consume more or less of them. Weight loss is usually a side effect of the Pritikin Eating Plan because of its focus on low calorie-dense foods.  Label Reading  Clinical staff conducted group or individual video education with verbal and written material and guidebook.  Patient learns about the Pritikin recommended label reading guidelines and corresponding recommendations regarding calorie density, added sugars, sodium content, and whole grains.  Dining Out - Part 1  Clinical staff conducted group or individual video education with verbal and written material and guidebook.  Patient learns that restaurant meals can be sabotaging because they can be so high in calories, fat, sodium, and/or sugar. Patient learns recommended strategies on how to positively address this and avoid unhealthy pitfalls.  Facts on Fats  Clinical staff conducted group or individual video education with verbal and written material and guidebook.  Patient learns that lifestyle modifications can be just as effective, if not more so, as many medications for lowering your risk of heart disease. A Pritikin lifestyle can  help to reduce your risk of inflammation and atherosclerosis (cholesterol build-up, or plaque, in the artery walls). Lifestyle interventions such as dietary choices and physical activity address the cause of atherosclerosis. A review of the types of fats and their impact on blood cholesterol levels, along with dietary recommendations to reduce fat intake is also included.  Nutrition Action Plan  Clinical staff conducted group or individual video education with verbal and written material and guidebook.  Patient learns how to incorporate Pritikin recommendations into their lifestyle. Recommendations include planning and keeping personal health goals in mind as an important part of their success.  Healthy  Mind-Set    Healthy Minds, Bodies, Hearts  Clinical staff conducted group or individual video education with verbal and written material and guidebook.  Patient learns how to identify when they are stressed. Video will discuss the impact of that stress, as well as the many benefits of stress management. Patient will also be introduced to stress management techniques. The way we think, act, and feel has an impact on our hearts.  How Our Thoughts Can Heal Our Hearts  Clinical staff conducted group or individual video education with verbal and written material and guidebook.  Patient learns that negative thoughts can cause depression and anxiety. This can result in negative lifestyle behavior and serious health problems. Cognitive behavioral therapy is an effective method to help control our thoughts in order to change and improve our emotional outlook.  Additional Videos:  Exercise    Improving Performance  Clinical staff conducted group or individual video education with verbal and written material and guidebook.  Patient learns to use a non-linear approach by alternating intensity levels and lengths of time spent exercising to help burn more calories and lose more body fat. Cardiovascular exercise  helps improve heart health, metabolism, hormonal balance, blood sugar control, and recovery from fatigue. Resistance training improves strength, endurance, balance, coordination, reaction time, metabolism, and muscle mass. Flexibility exercise improves circulation, posture, and balance. Seek guidance from your physician and exercise physiologist before implementing an exercise routine and learn your capabilities and proper form for all exercise.  Introduction to Yoga  Clinical staff conducted group or individual video education with verbal and written material and guidebook.  Patient learns about yoga, a discipline of the coming together of mind, breath, and body. The benefits of yoga include improved flexibility, improved range of motion, better posture and core strength, increased lung function, weight loss, and positive self-image. Yoga's heart health benefits include lowered blood pressure, healthier heart rate, decreased cholesterol and triglyceride levels, improved immune function, and reduced stress. Seek guidance from your physician and exercise physiologist before implementing an exercise routine and learn your capabilities and proper form for all exercise.  Medical   Aging: Enhancing Your Quality of Life  Clinical staff conducted group or individual video education with verbal and written material and guidebook.  Patient learns key strategies and recommendations to stay in good physical health and enhance quality of life, such as prevention strategies, having an advocate, securing a Health Care Proxy and Power of Attorney, and keeping a list of medications and system for tracking them. It also discusses how to avoid risk for bone loss.  Biology of Weight Control  Clinical staff conducted group or individual video education with verbal and written material and guidebook.  Patient learns that weight gain occurs because we consume more calories than we burn (eating more, moving less). Even if  your body weight is normal, you may have higher ratios of fat compared to muscle mass. Too much body fat puts you at increased risk for cardiovascular disease, heart attack, stroke, type 2 diabetes, and obesity-related cancers. In addition to exercise, following the Pritikin Eating Plan can help reduce your risk.  Decoding Lab Results  Clinical staff conducted group or individual video education with verbal and written material and guidebook.  Patient learns that lab test reflects one measurement whose values change over time and are influenced by many factors, including medication, stress, sleep, exercise, food, hydration, pre-existing medical conditions, and more. It is recommended to use the knowledge from this video to become more involved  with your lab results and evaluate your numbers to speak with your doctor.   Diseases of Our Time - Overview  Clinical staff conducted group or individual video education with verbal and written material and guidebook.  Patient learns that according to the CDC, 50% to 70% of chronic diseases (such as obesity, type 2 diabetes, elevated lipids, hypertension, and heart disease) are avoidable through lifestyle improvements including healthier food choices, listening to satiety cues, and increased physical activity.  Sleep Disorders Clinical staff conducted group or individual video education with verbal and written material and guidebook.  Patient learns how good quality and duration of sleep are important to overall health and well-being. Patient also learns about sleep disorders and how they impact health along with recommendations to address them, including discussing with a physician.  Nutrition  Dining Out - Part 2 Clinical staff conducted group or individual video education with verbal and written material and guidebook.  Patient learns how to plan ahead and communicate in order to maximize their dining experience in a healthy and nutritious manner.  Included are recommended food choices based on the type of restaurant the patient is visiting.   Fueling a Banker conducted group or individual video education with verbal and written material and guidebook.  There is a strong connection between our food choices and our health. Diseases like obesity and type 2 diabetes are very prevalent and are in large-part due to lifestyle choices. The Pritikin Eating Plan provides plenty of food and hunger-curbing satisfaction. It is easy to follow, affordable, and helps reduce health risks.  Menu Workshop  Clinical staff conducted group or individual video education with verbal and written material and guidebook.  Patient learns that restaurant meals can sabotage health goals because they are often packed with calories, fat, sodium, and sugar. Recommendations include strategies to plan ahead and to communicate with the manager, chef, or server to help order a healthier meal.  Planning Your Eating Strategy  Clinical staff conducted group or individual video education with verbal and written material and guidebook.  Patient learns about the Pritikin Eating Plan and its benefit of reducing the risk of disease. The Pritikin Eating Plan does not focus on calories. Instead, it emphasizes high-quality, nutrient-rich foods. By knowing the characteristics of the foods, we choose, we can determine their calorie density and make informed decisions.  Targeting Your Nutrition Priorities  Clinical staff conducted group or individual video education with verbal and written material and guidebook.  Patient learns that lifestyle habits have a tremendous impact on disease risk and progression. This video provides eating and physical activity recommendations based on your personal health goals, such as reducing LDL cholesterol, losing weight, preventing or controlling type 2 diabetes, and reducing high blood pressure.  Vitamins and Minerals  Clinical staff  conducted group or individual video education with verbal and written material and guidebook.  Patient learns different ways to obtain key vitamins and minerals, including through a recommended healthy diet. It is important to discuss all supplements you take with your doctor.   Healthy Mind-Set    Smoking Cessation  Clinical staff conducted group or individual video education with verbal and written material and guidebook.  Patient learns that cigarette smoking and tobacco addiction pose a serious health risk which affects millions of people. Stopping smoking will significantly reduce the risk of heart disease, lung disease, and many forms of cancer. Recommended strategies for quitting are covered, including working with your doctor to develop a successful  plan.  Culinary   Becoming a Pritikin Chef  Clinical staff conducted group or individual video education with verbal and written material and guidebook.  Patient learns that cooking at home can be healthy, cost-effective, quick, and puts them in control. Keys to cooking healthy recipes will include looking at your recipe, assessing your equipment needs, planning ahead, making it simple, choosing cost-effective seasonal ingredients, and limiting the use of added fats, salts, and sugars.  Cooking - Breakfast and Snacks  Clinical staff conducted group or individual video education with verbal and written material and guidebook.  Patient learns how important breakfast is to satiety and nutrition through the entire day. Recommendations include key foods to eat during breakfast to help stabilize blood sugar levels and to prevent overeating at meals later in the day. Planning ahead is also a key component.  Cooking - Educational psychologist conducted group or individual video education with verbal and written material and guidebook.  Patient learns eating strategies to improve overall health, including an approach to cook more at home.  Recommendations include thinking of animal protein as a side on your plate rather than center stage and focusing instead on lower calorie dense options like vegetables, fruits, whole grains, and plant-based proteins, such as beans. Making sauces in large quantities to freeze for later and leaving the skin on your vegetables are also recommended to maximize your experience.  Cooking - Healthy Salads and Dressing Clinical staff conducted group or individual video education with verbal and written material and guidebook.  Patient learns that vegetables, fruits, whole grains, and legumes are the foundations of the Pritikin Eating Plan. Recommendations include how to incorporate each of these in flavorful and healthy salads, and how to create homemade salad dressings. Proper handling of ingredients is also covered. Cooking - Soups and [employer] - Soups and Desserts Clinical staff conducted group or individual video education with verbal and written material and guidebook.  Patient learns that Pritikin soups and desserts make for easy, nutritious, and delicious snacks and meal components that are low in sodium, fat, sugar, and calorie density, while high in vitamins, minerals, and filling fiber. Recommendations include simple and healthy ideas for soups and desserts.   Overview     The Pritikin Solution Program Overview Clinical staff conducted group or individual video education with verbal and written material and guidebook.  Patient learns that the results of the Pritikin Program have been documented in more than 100 articles published in peer-reviewed journals, and the benefits include reducing risk factors for (and, in some cases, even reversing) high cholesterol, high blood pressure, type 2 diabetes, obesity, and more! An overview of the three key pillars of the Pritikin Program will be covered: eating well, doing regular exercise, and having a healthy mind-set.  WORKSHOPS   Exercise: Exercise Basics: Building Your Action Plan Clinical staff led group instruction and group discussion with PowerPoint presentation and patient guidebook. To enhance the learning environment the use of posters, models and videos may be added. At the conclusion of this workshop, patients will comprehend the difference between physical activity and exercise, as well as the benefits of incorporating both, into their routine. Patients will understand the FITT (Frequency, Intensity, Time, and Type) principle and how to use it to build an exercise action plan. In addition, safety concerns and other considerations for exercise and cardiac rehab will be addressed by the presenter. The purpose of this lesson is to promote a comprehensive and effective weekly exercise  routine in order to improve patients' overall level of fitness.   Managing Heart Disease: Your Path to a Healthier Heart Clinical staff led group instruction and group discussion with PowerPoint presentation and patient guidebook. To enhance the learning environment the use of posters, models and videos may be added.At the conclusion of this workshop, patients will understand the anatomy and physiology of the heart. Additionally, they will understand how Pritikin's three pillars impact the risk factors, the progression, and the management of heart disease.  The purpose of this lesson is to provide a high-level overview of the heart, heart disease, and how the Pritikin lifestyle positively impacts risk factors.  Exercise Biomechanics Clinical staff led group instruction and group discussion with PowerPoint presentation and patient guidebook. To enhance the learning environment the use of posters, models and videos may be added. Patients will learn how the structural parts of their bodies function and how these functions impact their daily activities, movement, and exercise. Patients will learn how to promote a neutral spine, learn how  to manage pain, and identify ways to improve their physical movement in order to promote healthy living. The purpose of this lesson is to expose patients to common physical limitations that impact physical activity. Participants will learn practical ways to adapt and manage aches and pains, and to minimize their effect on regular exercise. Patients will learn how to maintain good posture while sitting, walking, and lifting.  Balance Training and Fall Prevention  Clinical staff led group instruction and group discussion with PowerPoint presentation and patient guidebook. To enhance the learning environment the use of posters, models and videos may be added. At the conclusion of this workshop, patients will understand the importance of their sensorimotor skills (vision, proprioception, and the vestibular system) in maintaining their ability to balance as they age. Patients will apply a variety of balancing exercises that are appropriate for their current level of function. Patients will understand the common causes for poor balance, possible solutions to these problems, and ways to modify their physical environment in order to minimize their fall risk. The purpose of this lesson is to teach patients about the importance of maintaining balance as they age and ways to minimize their risk of falling.  WORKSHOPS   Nutrition:  Fueling a Ship broker led group instruction and group discussion with PowerPoint presentation and patient guidebook. To enhance the learning environment the use of posters, models and videos may be added. Patients will review the foundational principles of the Pritikin Eating Plan and understand what constitutes a serving size in each of the food groups. Patients will also learn Pritikin-friendly foods that are better choices when away from home and review make-ahead meal and snack options. Calorie density will be reviewed and applied to three nutrition priorities:  weight maintenance, weight loss, and weight gain. The purpose of this lesson is to reinforce (in a group setting) the key concepts around what patients are recommended to eat and how to apply these guidelines when away from home by planning and selecting Pritikin-friendly options. Patients will understand how calorie density may be adjusted for different weight management goals.  Mindful Eating  Clinical staff led group instruction and group discussion with PowerPoint presentation and patient guidebook. To enhance the learning environment the use of posters, models and videos may be added. Patients will briefly review the concepts of the [facility]ikin Eating Plan and the importance of low-calorie dense foods. The concept of mindful eating will be introduced as well as the importance  of paying attention to internal hunger signals. Triggers for non-hunger eating and techniques for dealing with triggers will be explored. The purpose of this lesson is to provide patients with the opportunity to review the basic principles of the [facility]ikin Eating Plan, discuss the value of eating mindfully and how to measure internal cues of hunger and fullness using the Hunger Scale. Patients will also discuss reasons for non-hunger eating and learn strategies to use for controlling emotional eating.  Targeting Your Nutrition Priorities Clinical staff led group instruction and group discussion with PowerPoint presentation and patient guidebook. To enhance the learning environment the use of posters, models and videos may be added. Patients will learn how to determine their genetic susceptibility to disease by reviewing their family history. Patients will gain insight into the importance of diet as part of an overall healthy lifestyle in mitigating the impact of genetics and other environmental insults. The purpose of this lesson is to provide patients with the opportunity to assess their personal nutrition priorities by looking at  their family history, their own health history and current risk factors. Patients will also be able to discuss ways of prioritizing and modifying the P[facility]ikin Eating Plan for their highest risk areas  Menu  Clinical staff led group instruction and group discussion with PowerPoint presentation and patient guidebook. To enhance the learning environment the use of posters, models and videos may be added. Using menus brought in from [employer], or printed from [employer], patients will apply the Pritikin dining out guidelines that were presented in the [employer] video. Patients will also be able to practice these guidelines in a variety of provided scenarios. The purpose of this lesson is to provide patients with the opportunity to practice hands-on learning of the Pritikin Dining Out guidelines with actual menus and practice scenarios.  Label Reading Clinical staff led group instruction and group discussion with PowerPoint presentation and patient guidebook. To enhance the learning environment the use of posters, models and videos may be added. Patients will review and discuss the Pritikin label reading guidelines presented in [employer]'s Label Reading Educational series video. Using fool labels brought in from local grocery stores and markets, patients will apply the label reading guidelines and determine if the packaged food meet the Pritikin guidelines. The purpose of this lesson is to provide patients with the opportunity to review, discuss, and practice hands-on learning of the Pritikin Label Reading guidelines with actual packaged food labels. Cooking School  [facility]'s [employer] are designed to teach patients ways to prepare quick, simple, and affordable recipes at home. The importance of nutrition's role in chronic disease risk reduction is reflected in its emphasis in the overall Pritikin program. By learning how to prepare essential core Pritikin Eating  Plan recipes, patients will increase control over what they eat; be able to customize the flavor of foods without the use of added salt, sugar, or fat; and improve the quality of the food they consume. By learning a set of core recipes which are easily assembled, quickly prepared, and affordable, patients are more likely to prepare more healthy foods at home. These workshops focus on convenient breakfasts, simple entres, side dishes, and desserts which can be prepared with minimal effort and are consistent with nutrition recommendations for cardiovascular risk reduction. Cooking [employer] are taught by a Armed forces logistics/support/administrative officer (RD) who has been trained by the [employer]. The chef or RD has a clear understanding of the importance of minimizing -  if not completely eliminating - added fat, sugar, and sodium in recipes. Throughout the series of Cooking School Workshop sessions, patients will learn about healthy ingredients and efficient methods of cooking to build confidence in their capability to prepare    Cooking School weekly topics:  Adding Flavor- Sodium-Free  Fast and Healthy Breakfasts  Powerhouse Plant-Based Proteins  Satisfying Salads and Dressings  Simple Sides and Sauces  International Cuisine-Spotlight on the [employer] Zones  Delicious Desserts  Savory Soups  [employer] - Meals in a Astronomer Appetizers and Snacks  Comforting Weekend Breakfasts  One-Pot Wonders   Fast Evening Meals  Landscape architect Your Pritikin Plate  WORKSHOPS   Healthy Mindset (Psychosocial):  Focused Goals, Sustainable Changes Clinical staff led group instruction and group discussion with PowerPoint presentation and patient guidebook. To enhance the learning environment the use of posters, models and videos may be added. Patients will be able to apply effective goal setting strategies to establish at least one personal goal, and then take consistent, meaningful  action toward that goal. They will learn to identify common barriers to achieving personal goals and develop strategies to overcome them. Patients will also gain an understanding of how our mind-set can impact our ability to achieve goals and the importance of cultivating a positive and growth-oriented mind-set. The purpose of this lesson is to provide patients with a deeper understanding of how to set and achieve personal goals, as well as the tools and strategies needed to overcome common obstacles which may arise along the way.  From Head to Heart: The Power of a Healthy Outlook  Clinical staff led group instruction and group discussion with PowerPoint presentation and patient guidebook. To enhance the learning environment the use of posters, models and videos may be added. Patients will be able to recognize and describe the impact of emotions and mood on physical health. They will discover the importance of self-care and explore self-care practices which may work for them. Patients will also learn how to utilize the 4 C's to cultivate a healthier outlook and better manage stress and challenges. The purpose of this lesson is to demonstrate to patients how a healthy outlook is an essential part of maintaining good health, especially as they continue their cardiac rehab journey.  Healthy Sleep for a Healthy Heart Clinical staff led group instruction and group discussion with PowerPoint presentation and patient guidebook. To enhance the learning environment the use of posters, models and videos may be added. At the conclusion of this workshop, patients will be able to demonstrate knowledge of the importance of sleep to overall health, well-being, and quality of life. They will understand the symptoms of, and treatments for, common sleep disorders. Patients will also be able to identify daytime and nighttime behaviors which impact sleep, and they will be able to apply these tools to help manage sleep-related  challenges. The purpose of this lesson is to provide patients with a general overview of sleep and outline the importance of quality sleep. Patients will learn about a few of the most common sleep disorders. Patients will also be introduced to the concept of "sleep hygiene," and discover ways to self-manage certain sleeping problems through simple daily behavior changes. Finally, the workshop will motivate patients by clarifying the links between quality sleep and their goals of heart-healthy living.   Recognizing and Reducing Stress Clinical staff led group instruction and group discussion with PowerPoint presentation and patient guidebook. To enhance the learning environment the use of posters,  models and videos may be added. At the conclusion of this workshop, patients will be able to understand the types of stress reactions, differentiate between acute and chronic stress, and recognize the impact that chronic stress has on their health. They will also be able to apply different coping mechanisms, such as reframing negative self-talk. Patients will have the opportunity to practice a variety of stress management techniques, such as deep abdominal breathing, progressive muscle relaxation, and/or guided imagery.  The purpose of this lesson is to educate patients on the role of stress in their lives and to provide healthy techniques for coping with it.  Learning Barriers/Preferences:  Learning Barriers/Preferences - 11/30/23 1105       Learning Barriers/Preferences   Learning Barriers Sight;Exercise Concerns   Wears reading glasses. Has pain in right shoulder and has a cyst on her mid  lower back   Learning Preferences Skilled Demonstration          Education Topics:  Knowledge Questionnaire Score:  Knowledge Questionnaire Score - 11/30/23 1237       Knowledge Questionnaire Score   Pre Score 22/24          Core Components/Risk Factors/Patient Goals at Admission:  Personal Goals and Risk  Factors at Admission - 11/30/23 1036       Core Components/Risk Factors/Patient Goals on Admission    Weight Management Yes;Obesity;Weight Loss    Intervention Weight Management/Obesity: Establish reasonable short term and long term weight goals.;Obesity: Provide education and appropriate resources to help participant work on and attain dietary goals.    Admit Weight 203 lb 7.8 oz (92.3 kg)    Expected Outcomes Short Term: Continue to assess and modify interventions until short term weight is achieved;Long Term: Adherence to nutrition and physical activity/exercise program aimed toward attainment of established weight goal;Weight Loss: Understanding of general recommendations for a balanced deficit meal plan, which promotes 1-2 lb weight loss per week and includes a negative energy balance of [phone] kcal/d    Hypertension Yes    Intervention Provide education on lifestyle modifcations including regular physical activity/exercise, weight management, moderate sodium restriction and increased consumption of fresh fruit, vegetables, and low fat dairy, alcohol moderation, and smoking cessation.;Monitor prescription use compliance.    Expected Outcomes Short Term: Continued assessment and intervention until BP is < 140/61mm HG in hypertensive participants. < 130/77mm HG in hypertensive participants with diabetes, heart failure or chronic kidney disease.;Long Term: Maintenance of blood pressure at goal levels.    Lipids Yes    Intervention Provide education and support for participant on nutrition & aerobic/resistive exercise along with prescribed medications to achieve LDL 70mg , HDL >40mg .    Expected Outcomes Short Term: Participant states understanding of desired cholesterol values and is compliant with medications prescribed. Participant is following exercise prescription and nutrition guidelines.;Long Term: Cholesterol controlled with medications as prescribed, with individualized exercise RX and with  personalized nutrition plan. Value goals: LDL < 70mg , HDL > 40 mg.          Core Components/Risk Factors/Patient Goals Review:   Goals and Risk Factor Review     Row Name 12/06/23 1718 12/28/23 1410 01/24/24 1710         Core Components/Risk Factors/Patient Goals Review   Personal Goals Review Weight Management/Obesity;Hypertension;Lipids Weight Management/Obesity;Hypertension;Lipids Weight Management/Obesity;Hypertension;Lipids     Review [name] started cardiac rehab on 12/06/23. [name] did well with exercise. vital signs were stable. Charlies is doing well with exercise at cardiac rehab.  vital signs have been stable. [name]  has increased her work loads Charlies continues to  do well with exercise at cardiac rehab.  vital signs have been stable. Charlies has increased her work loads     Expected Outcomes Reneee will continue to participate in cardiac rehab for exercise, nutrition and lifestyle modifications. Charlies will continue to participate in cardiac rehab for exercise, nutrition and lifestyle modifications. Charlies will continue to participate in cardiac rehab for exercise, nutrition and lifestyle modifications.        Core Components/Risk Factors/Patient Goals at Discharge (Final Review):   Goals and Risk Factor Review - 01/24/24 1710       Core Components/Risk Factors/Patient Goals Review   Personal Goals Review Weight Management/Obesity;Hypertension;Lipids    Review Charlies continues to  do well with exercise at cardiac rehab.  vital signs have been stable. Charlies has increased her work loads    Expected Outcomes [name] will continue to participate in cardiac rehab for exercise, nutrition and lifestyle modifications.          ITP Comments:  ITP Comments     Row Name 11/30/23 1059 12/06/23 1715 12/28/23 1406 01/24/24 1708     ITP Comments Introduction to [facility] Education Program/Intensive Cardiac Rehab. Initial Orientation Packet reviewed with the patient 30 Day ITP Review. [name]  started cardiac rehab on 12/06/23. [name] did well with exercise. 30 Day ITP Review. Charlies has good attendance and participation with exercise at cardiac rehab. 30 Day ITP Review. Charlies continues to have  good  participation with exercise at cardiac rehab when in attendance..       Comments: See ITP Comments

## 2024-01-26 ENCOUNTER — Encounter (HOSPITAL_COMMUNITY)
Admission: RE | Admit: 2024-01-26 | Discharge: 2024-01-26 | Disposition: A | Discharge status: Home / Self Care | Source: Ambulatory Visit | Attending: Cardiology

## 2024-01-26 DIAGNOSIS — Z955 Presence of coronary angioplasty implant and graft: Secondary | ICD-10-CM

## 2024-01-28 ENCOUNTER — Encounter (HOSPITAL_COMMUNITY)

## 2024-01-31 ENCOUNTER — Encounter (HOSPITAL_COMMUNITY)
Admission: RE | Admit: 2024-01-31 | Discharge: 2024-01-31 | Disposition: A | Discharge status: Home / Self Care | Source: Ambulatory Visit | Attending: Cardiology | Admitting: Cardiology

## 2024-01-31 DIAGNOSIS — Z955 Presence of coronary angioplasty implant and graft: Secondary | ICD-10-CM

## 2024-02-02 ENCOUNTER — Encounter (HOSPITAL_COMMUNITY)

## 2024-02-02 ENCOUNTER — Other Ambulatory Visit: Payer: Medicare HMO

## 2024-02-04 ENCOUNTER — Encounter (HOSPITAL_COMMUNITY)

## 2024-02-07 ENCOUNTER — Encounter (HOSPITAL_COMMUNITY)
Admission: RE | Admit: 2024-02-07 | Discharge: 2024-02-07 | Disposition: A | Discharge status: Home / Self Care | Source: Ambulatory Visit | Attending: Cardiology | Admitting: Cardiology

## 2024-02-07 DIAGNOSIS — Z955 Presence of coronary angioplasty implant and graft: Secondary | ICD-10-CM | POA: Diagnosis not present

## 2024-02-09 ENCOUNTER — Encounter (HOSPITAL_COMMUNITY)
Admission: RE | Admit: 2024-02-09 | Discharge: 2024-02-09 | Disposition: A | Discharge status: Home / Self Care | Source: Ambulatory Visit | Attending: Cardiology | Admitting: Cardiology

## 2024-02-09 DIAGNOSIS — Z955 Presence of coronary angioplasty implant and graft: Secondary | ICD-10-CM | POA: Diagnosis not present

## 2024-02-11 ENCOUNTER — Encounter (HOSPITAL_COMMUNITY)

## 2024-02-13 NOTE — Progress Notes (Unsigned)
 Cardiology Office Note:    Date:  02/14/2024   ID:  [name], DOB 01-20-1956, MRN [id number]  PCP:  Haze Kingfisher, MD   [facility] Providers Cardiologist:  Alm Clay, MD     Referring MD: Haze Kingfisher, MD   Chief complaint: 21-month follow-up  History of Present Illness:    Morgan Wolfe is a 68 y.o. female with a hx of CAD s/p DES-p-mRCA, DES-CTO-mLAD in 10/2023, hypertension, and hyperlipidemia who presents to the office for a 59-month follow-up visit.  Previously seen at [facility], echo 10/08/2022: EF 55-60%, mild LVH.  ZIO monitor March - April 2024: SR, 43-138 bpm, average 67 bpm.  Rare PACs and PVCs.  No arrhythmias.  Patient was referred to cardiology in April 2025 for exertional chest pain/shortness of breath.  Coronary CTA was ordered that revealed a CTO of the mid LAD with possible right to left collaterals, coronaries calcium  score 59, 76th percentile for age, sex, race matched controls.  Study sent for CT FFR which showed LAD FFR unable to be modeled beyond mid vessel.  With no significant stenosis in LCx and RCA.  LHC scheduled for June 2025.  LHC 10/20/2023: 100% mid LAD CTO, 80% proximal-mid RCA lesions. Normal LCx. Successful CTO PCI using 2.75 X 38 Synergy XD placed mid LAD, TIMI-3 flow postintervention.  Distal LAD lesion 40% stenosed. Successful 2.5 X16 Synergy XD deployed to proximal RCA-mid RCA lesion, TIMI-3 flow postintervention. Normal LVEDP.  DAPT recommended X 6 months with ASA/Plavix  daily to be followed by long-term Plavix  monotherapy.  Follow-up on 11/09/2023 revealed patient was stable with no anginal symptoms.  Patient was continued on GDMT of aspirin , Plavix , metoprolol , losartan , amlodipine , and Crestor .  Patient presents to clinic today doing well. She denies chest pain, palpitations, dyspnea, pnd, orthopnea, n, v, dizziness, headache, blurred vision, syncope, dark/tarry/bloody stools.  Did report new weight gain of 3  pounds over the course of a week.  Denies smoking.  Has been going to cardiac rehab regularly, will be heading there after leaving here today.   Past Medical History:  Diagnosis Date   ALLERGIC RHINITIS    Coronary artery disease    Hypertension    PONV (postoperative nausea and vomiting)    Right shoulder pain     Past Surgical History:  Procedure Laterality Date   ANKLE ARTHROSCOPY Left 01/03/2019   Procedure: LEFT ANKLE ARTHROSCOPY AND DEBRIDEMENT;  Surgeon: Harden Jerona GAILS, MD;  Location: [facility];  Service: Orthopedics;  Laterality: Left;   ANKLE SURGERY Left 2000   CARDIAC CATHETERIZATION     CORONARY CTO INTERVENTION N/A 10/20/2023   Procedure: CORONARY CTO INTERVENTION;  Surgeon: Clay Alm ORN, MD;  Location: [facility] INVASIVE CV LAB;  Service: Cardiovascular;  Laterality: N/A;   CORONARY STENT INTERVENTION N/A 10/20/2023   Procedure: CORONARY STENT INTERVENTION;  Surgeon: Clay Alm ORN, MD;  Location: [facility] INVASIVE CV LAB;  Service: Cardiovascular;  Laterality: N/A;   ELBOW ARTHROSCOPY Right 2019   LEFT HEART CATH AND CORONARY ANGIOGRAPHY N/A 10/20/2023   Procedure: LEFT HEART CATH AND CORONARY ANGIOGRAPHY;  Surgeon: Clay Alm ORN, MD;  Location: [facility] INVASIVE CV LAB;  Service: Cardiovascular;  Laterality: N/A;   TOE SURGERY Right 1998   bone spur   VAGINAL HYSTERECTOMY  1980   per Dr. Okey for fibroids; no cancer    Current Medications: Current Meds  Medication Sig   allopurinol  (ZYLOPRIM ) 100 MG tablet Take 1 tablet (100 mg total) by  mouth daily. (Patient taking differently: Take 100 mg by mouth daily as needed.)   amLODipine  (NORVASC ) 5 MG tablet Take 1 tablet (5 mg total) by mouth daily.   aspirin  81 MG chewable tablet Chew 1 tablet (81 mg total) by mouth daily.   cholecalciferol (VITAMIN D3) 25 MCG (1000 UNIT) tablet Take 1,000 Units by mouth daily.   clopidogrel  (PLAVIX ) 75 MG tablet Take 1 tablet (75 mg total) by mouth daily with breakfast.    fluticasone  (FLONASE ) 50 MCG/ACT nasal spray SHAKE LIQUID AND USE 2 SPRAYS IN EACH NOSTRIL DAILY   loratadine  (CLARITIN ) 10 MG tablet Take 1 tablet (10 mg total) by mouth daily as needed for allergies.   losartan  (COZAAR ) 100 MG tablet Take 1 tablet (100 mg total) by mouth every morning.   metoprolol  tartrate (LOPRESSOR ) 50 MG tablet Take 1 tablet (50 mg total) by mouth 2 (two) times daily.   rosuvastatin  (CRESTOR ) 40 MG tablet Take 1 tablet (40 mg total) by mouth daily.     Allergies:   Augmentin  [amoxicillin -pot clavulanate] and Sulfonamide derivatives   Social History   Socioeconomic History   Marital status: Single    Spouse name: Not on file   Number of children: 0   Years of education: 12   Highest education level: High school graduate  Occupational History   Occupation: retired  Tobacco Use   Smoking status: Never   Smokeless tobacco: Never  Vaping Use   Vaping status: Never Used  Substance and Sexual Activity   Alcohol use: No   Drug use: No   Sexual activity: Not Currently  Other Topics Concern   Not on file  Social History Narrative   Not on file   Social Drivers of Health   Financial Resource Strain: Low Risk  (07/10/2022)   Overall Financial Resource Strain (CARDIA)    Difficulty of Paying Living Expenses: Not hard at all  Food Insecurity: No Food Insecurity (07/10/2022)   Hunger Vital Sign    Worried About Running Out of Food in the Last Year: Never true    Ran Out of Food in the Last Year: Never true  Transportation Needs: No Transportation Needs (07/10/2022)   PRAPARE - Transportation    Lack of Transportation (Medical): No    Lack of Transportation (Non-Medical): No  Physical Activity: Sufficiently Active (07/10/2022)   Exercise Vital Sign    Days of Exercise per Week: 3 days    Minutes of Exercise per Session: 60 min  Stress: No Stress Concern Present (07/10/2022)   [employer] of Occupational Health - Occupational Stress Questionnaire    Feeling  of Stress : Not at all  Social Connections: Moderately Integrated (07/10/2022)   Social Connection and Isolation Panel    Frequency of Communication with Friends and Family: More than three times a week    Frequency of Social Gatherings with Friends and Family: More than three times a week    Attends Religious Services: More than 4 times per year    Active Member of [employer] or Organizations: Yes    Attends Engineer, structural: More than 4 times per year    Marital Status: Never married     Family History: The patient's family history includes Brain cancer in her sister; Breast cancer in her sister; Coronary artery disease in an other family member; Glaucoma in her mother; Heart attack (age of onset: 54) in her brother; Heart failure in her mother; Hyperlipidemia in an other family member; Hypertension in an  other family member; Kidney failure in her mother; Pancreatic cancer in her father; Stroke (age of onset: 18) in her brother. There is no history of Colon cancer or Stomach cancer.  ROS:   Please see the history of present illness.    All other systems reviewed and are negative.  EKGs/Labs/Other Studies Reviewed:    The following studies were reviewed today:      Recent Labs: 09/29/2023: BUN 12; Creatinine, Ser 0.89; Hemoglobin 12.2; Platelets 311; Potassium 4.0; Sodium 143 11/18/2023: ALT 17  Recent Lipid Panel    Component Value Date/Time   CHOL 118 11/18/2023 0848   TRIG 179 (H) 11/18/2023 0848   TRIG 174 (H) 03/02/2006 1057   HDL 48 11/18/2023 0848   CHOLHDL 2.5 11/18/2023 0848   CHOLHDL 4 10/28/2020 0922   VLDL 33.2 10/28/2020 0922   LDLCALC 41 11/18/2023 0848   LDLDIRECT 59.0 01/04/2017 1353   RADIOLOGY CT Coronary Angiography (09/19/2023): LAD-mid 100%.  RCA-mild to moderate stenosis.  LCx- normal.  1. Left Main:  No significant stenosis. FFR = 1.00  2. LAD: FFR unable to be modeled beyond mid vessel. Proximal FFR =0.99, mid FFR = 0.97 3. LCX: No significant  stenosis. Proximal FFR = 0.98, Distal FFR =0.97 4. RCA: No significant stenosis. Proximal FFR = 0.99, Mid FFR =0.83, Distal FFR = 0.80   IMPRESSION: 1. No flow modeled beyond mid LAD, concerning for chronic total occlusion. Findings communicated to Dr. Anner.   Physical Exam:    VS:  BP 118/68 (BP Location: Left Arm, Patient Position: Sitting, Cuff Size: Large)   Pulse 60   Ht 5' 6 (1.676 m)   Wt 206 lb (93.4 kg)   SpO2 97%   BMI 33.25 kg/m     Wt Readings from Last 3 Encounters:  02/14/24 206 lb (93.4 kg)  11/30/23 203 lb 7.8 oz (92.3 kg)  11/09/23 200 lb 12.8 oz (91.1 kg)     GEN:  Well nourished, well developed in no acute distress HEENT: Normal NECK: Mild JVD; No carotid bruits CARDIAC: RRR, no murmurs, rubs, gallops RESPIRATORY:  Mild crackles throughout all fields, predominantly upper lobes, no rhonchi, rubs ABDOMEN: Soft, non-tender, non-distended MUSCULOSKELETAL:  +1 bilateral LE pitting edema, No deformity  SKIN: Warm and dry NEUROLOGIC:  Alert and oriented x 3 PSYCHIATRIC:  Normal affect   ASSESSMENT:    1. Coronary artery disease involving native coronary artery of native heart without angina pectoris   2. Primary hypertension   3. Hyperlipidemia, unspecified hyperlipidemia type   4. Lower leg edema    PLAN:    In order of problems listed above:  CAD s/p PCI CTO PCI mLAD PCI pRCA-mRCA Denies chest pain, shortness of breath, DOE, orthopnea, palpitations, near-syncope. Denies missing doses of antiplatelet  Continue Plavix  75 mg daily Continue aspirin  81 mg daily  Continue losartan  100 mg daily Continue Lopressor  50 mg daily Continue Crestor  40 mg daily Check BMP and CBC   JVD LE edema Mild crackles Observed mild pitting LE edema, mild JVD, mild crackles on exam Denies SOB, DOE, orthopnea Reported 3 pound weight gain in 1 week No echo on file Will order echo and BNP in the setting of mild JVD, LE edema, mild crackles, weight gain Despite  this, overall appears euvolemic and well compensated on exam If echo normal could consider d/c amlodipine  if LE swelling worsens.   HTN BP reported as well controlled Denies headache, blurred vision, dizziness, light headedness, near syncope Continue losartan  100 mg  daily Continue Lopressor  50 mg daily Continue amlodipine  5 mg daily   HLD LDL goal <55  Lipid panel 06/23/7972: Cholesterol 118, HDL 48, LDL 41, triglycerides 179 Continue Crestor  40 mg daily  Follow-up in 6 months or sooner if new symptoms occur.   Medication Adjustments/Labs and Tests Ordered: Current medicines are reviewed at length with the patient today.  Concerns regarding medicines are outlined above.  Orders Placed This Encounter  Procedures   Basic metabolic panel with GFR   Brain natriuretic peptide   CBC   ECHOCARDIOGRAM COMPLETE   No orders of the defined types were placed in this encounter.   Patient Instructions  Medication Instructions:  Your physician recommends that you continue on your current medications as directed. Please refer to the Current Medication list given to you today.  *If you need a refill on your cardiac medications before your next appointment, please call your pharmacy*  Lab Work: BMP, BNP, CBC today  Testing/Procedures: Your physician has requested that you have an echocardiogram. Echocardiography is a painless test that uses sound waves to create images of your heart. It provides your doctor with information about the size and shape of your heart and how well your heart's chambers and valves are working. This procedure takes approximately one hour. There are no restrictions for this procedure. Please do NOT wear cologne, perfume, aftershave, or lotions (deodorant is allowed). Please arrive 15 minutes prior to your appointment time.  Please note: We ask at that you not bring children with you during ultrasound (echo/ vascular) testing. Due to room size and safety concerns,  children are not allowed in the ultrasound rooms during exams. Our front office staff cannot provide observation of children in our lobby area while testing is being conducted. An adult accompanying a patient to their appointment will only be allowed in the ultrasound room at the discretion of the ultrasound technician under special circumstances. We apologize for any inconvenience.   Follow-Up: At [facility], you and your health needs are our priority.  As part of our continuing mission to provide you with exceptional heart care, our providers are all part of one team.  This team includes your primary Cardiologist (physician) and Advanced Practice Providers or APPs (Physician Assistants and Nurse Practitioners) who all work together to provide you with the care you need, when you need it.  Your next appointment:   6 month(s)  Provider:   Alm Clay, MD or Miriam Shams, NP          We recommend signing up for the patient portal called MyChart.  Sign up information is provided on this After Visit Summary.  MyChart is used to connect with patients for Virtual Visits (Telemedicine).  Patients are able to view lab/test results, encounter notes, upcoming appointments, etc.  Non-urgent messages can be sent to your provider as well.   To learn more about what you can do with MyChart, go to [email or web address].             Signed, Miriam FORBES Shams, NP  02/14/2024 9:44 AM    [facility]

## 2024-02-14 ENCOUNTER — Encounter: Payer: Self-pay | Admitting: Nurse Practitioner

## 2024-02-14 ENCOUNTER — Encounter (HOSPITAL_COMMUNITY)
Admission: RE | Admit: 2024-02-14 | Discharge: 2024-02-14 | Disposition: A | Source: Ambulatory Visit | Attending: Cardiology

## 2024-02-14 ENCOUNTER — Ambulatory Visit: Attending: Nurse Practitioner | Admitting: Emergency Medicine

## 2024-02-14 VITALS — BP 118/68 | HR 60 | Ht 66.0 in | Wt 206.0 lb

## 2024-02-14 DIAGNOSIS — I2510 Atherosclerotic heart disease of native coronary artery without angina pectoris: Secondary | ICD-10-CM

## 2024-02-14 DIAGNOSIS — E785 Hyperlipidemia, unspecified: Secondary | ICD-10-CM | POA: Diagnosis not present

## 2024-02-14 DIAGNOSIS — R600 Localized edema: Secondary | ICD-10-CM | POA: Diagnosis not present

## 2024-02-14 DIAGNOSIS — I10 Essential (primary) hypertension: Secondary | ICD-10-CM | POA: Diagnosis not present

## 2024-02-14 DIAGNOSIS — Z955 Presence of coronary angioplasty implant and graft: Secondary | ICD-10-CM | POA: Diagnosis not present

## 2024-02-14 NOTE — Patient Instructions (Signed)
 Medication Instructions:  Your physician recommends that you continue on your current medications as directed. Please refer to the Current Medication list given to you today.  *If you need a refill on your cardiac medications before your next appointment, please call your pharmacy*  Lab Work: BMP, BNP, CBC today  Testing/Procedures: Your physician has requested that you have an echocardiogram. Echocardiography is a painless test that uses sound waves to create images of your heart. It provides your doctor with information about the size and shape of your heart and how well your heart's chambers and valves are working. This procedure takes approximately one hour. There are no restrictions for this procedure. Please do NOT wear cologne, perfume, aftershave, or lotions (deodorant is allowed). Please arrive 15 minutes prior to your appointment time.  Please note: We ask at that you not bring children with you during ultrasound (echo/ vascular) testing. Due to room size and safety concerns, children are not allowed in the ultrasound rooms during exams. Our front office staff cannot provide observation of children in our lobby area while testing is being conducted. An adult accompanying a patient to their appointment will only be allowed in the ultrasound room at the discretion of the ultrasound technician under special circumstances. We apologize for any inconvenience.   Follow-Up: At [facility], you and your health needs are our priority.  As part of our continuing mission to provide you with exceptional heart care, our providers are all part of one team.  This team includes your primary Cardiologist (physician) and Advanced Practice Providers or APPs (Physician Assistants and Nurse Practitioners) who all work together to provide you with the care you need, when you need it.  Your next appointment:   6 month(s)  Provider:   Alm Clay, MD or Miriam Shams, NP          We  recommend signing up for the patient portal called MyChart.  Sign up information is provided on this After Visit Summary.  MyChart is used to connect with patients for Virtual Visits (Telemedicine).  Patients are able to view lab/test results, encounter notes, upcoming appointments, etc.  Non-urgent messages can be sent to your provider as well.   To learn more about what you can do with MyChart, go to [email or web address].

## 2024-02-16 ENCOUNTER — Encounter (HOSPITAL_COMMUNITY)
Admission: RE | Admit: 2024-02-16 | Discharge: 2024-02-16 | Disposition: A | Discharge status: Home / Self Care | Source: Ambulatory Visit | Attending: Cardiology | Admitting: Cardiology

## 2024-02-16 DIAGNOSIS — Z955 Presence of coronary angioplasty implant and graft: Secondary | ICD-10-CM | POA: Diagnosis present

## 2024-02-17 ENCOUNTER — Ambulatory Visit: Payer: Self-pay | Admitting: Emergency Medicine

## 2024-02-17 LAB — BASIC METABOLIC PANEL WITH GFR
BUN/Creatinine Ratio: 19 (ref 12–28)
BUN: 15 mg/dL (ref 8–27)
CO2: 23 mmol/L (ref 20–29)
Calcium: 9.2 mg/dL (ref 8.7–10.3)
Chloride: 106 mmol/L (ref 96–106)
Creatinine, Ser: 0.81 mg/dL (ref 0.57–1.00)
Glucose: 115 mg/dL — AB (ref 70–99)
Potassium: 3.8 mmol/L (ref 3.5–5.2)
Sodium: 143 mmol/L (ref 134–144)
eGFR: 80 mL/min/1.73 (ref >59)

## 2024-02-17 LAB — CBC
Hematocrit: 35.6 % (ref 34.0–46.6)
Hemoglobin: 11.4 g/dL (ref 11.1–15.9)
MCH: 27.7 pg (ref 26.6–33.0)
MCHC: 32 g/dL (ref 31.5–35.7)
MCV: 86 fL (ref 79–97)
Platelets: 290 x10E3/uL (ref 150–450)
RBC: 4.12 x10E6/uL (ref 3.77–5.28)
RDW: 13.7 % (ref 11.7–15.4)
WBC: 8.3 x10E3/uL (ref 3.4–10.8)

## 2024-02-17 LAB — BRAIN NATRIURETIC PEPTIDE: BNP: 7.7 pg/mL (ref 0.0–100.0)

## 2024-02-18 ENCOUNTER — Encounter (HOSPITAL_COMMUNITY)

## 2024-02-21 ENCOUNTER — Encounter (HOSPITAL_COMMUNITY)
Admission: RE | Admit: 2024-02-21 | Discharge: 2024-02-21 | Disposition: A | Discharge status: Home / Self Care | Source: Ambulatory Visit | Attending: Cardiology | Admitting: Cardiology

## 2024-02-21 DIAGNOSIS — Z955 Presence of coronary angioplasty implant and graft: Secondary | ICD-10-CM | POA: Diagnosis not present

## 2024-02-22 NOTE — Progress Notes (Signed)
 Cardiac Individual Treatment Plan  Patient Details  Name: Morgan Wolfe MRN: [id number] Date of Birth: 08-25-1955 Referring Provider:   Flowsheet Row INTENSIVE CARDIAC REHAB ORIENT from 11/30/2023 in [facility]  Referring Provider Anner Alm ORN, MD    Initial Encounter Date:  Flowsheet Row INTENSIVE CARDIAC REHAB ORIENT from 11/30/2023 in [facility]  Date 11/30/23    Visit Diagnosis: 10/20/23 S/P DES mRCA, S/P DES CTO mLAD  Patient's Home Medications on Admission:  Current Outpatient Medications:    allopurinol  (ZYLOPRIM ) 100 MG tablet, Take 1 tablet (100 mg total) by mouth daily. (Patient taking differently: Take 100 mg by mouth daily as needed.), Disp: 30 tablet, Rfl: 1   amLODipine  (NORVASC ) 5 MG tablet, Take 1 tablet (5 mg total) by mouth daily., Disp: 90 tablet, Rfl: 3   aspirin  81 MG chewable tablet, Chew 1 tablet (81 mg total) by mouth daily., Disp: 90 tablet, Rfl: 1   cholecalciferol (VITAMIN D3) 25 MCG (1000 UNIT) tablet, Take 1,000 Units by mouth daily., Disp: , Rfl:    clopidogrel  (PLAVIX ) 75 MG tablet, Take 1 tablet (75 mg total) by mouth daily with breakfast., Disp: 90 tablet, Rfl: 3   fluticasone  (FLONASE ) 50 MCG/ACT nasal spray, SHAKE LIQUID AND USE 2 SPRAYS IN EACH NOSTRIL DAILY, Disp: 16 g, Rfl: 5   loratadine  (CLARITIN ) 10 MG tablet, Take 1 tablet (10 mg total) by mouth daily as needed for allergies., Disp: 30 tablet, Rfl: 1   losartan  (COZAAR ) 100 MG tablet, Take 1 tablet (100 mg total) by mouth every morning., Disp: 90 tablet, Rfl: 3   metoprolol  tartrate (LOPRESSOR ) 50 MG tablet, Take 1 tablet (50 mg total) by mouth 2 (two) times daily., Disp: 180 tablet, Rfl: 3   rosuvastatin  (CRESTOR ) 40 MG tablet, Take 1 tablet (40 mg total) by mouth daily., Disp: 90 tablet, Rfl: 3  Past Medical History: Past Medical History:  Diagnosis Date   ALLERGIC  RHINITIS    Coronary artery disease    Hypertension    PONV (postoperative nausea and vomiting)    Right shoulder pain     Tobacco Use: Social History   Tobacco Use  Smoking Status Never  Smokeless Tobacco Never    Labs: Review Flowsheet  More data exists      Latest Ref Rng & Units 02/06/2015 01/04/2017 01/10/2018 10/28/2020 11/18/2023  Labs for ITP Cardiac and Pulmonary Rehab  Cholestrol 100 - 199 mg/dL 820  814  818  815  881   LDL (calc) 0 - 99 mg/dL 84  - 85  99  41   Direct LDL mg/dL - 40.9  - - -  HDL-C >60 mg/dL 38.19  38.59  39.09  48.49  48   Trlycerides 0 - 149 mg/dL 837.9  759.9  825.9  833.9  179      Exercise Target Goals: Exercise Program Goal: Individual exercise prescription set using results from initial 6 min walk test and THRR while considering  patient's activity barriers and safety.   Exercise Prescription Goal: Initial exercise prescription builds to 30-45 minutes a day of aerobic activity, 2-3 days per week.  Home exercise guidelines will be given to patient during program as part of exercise prescription that the participant will acknowledge.   Education: Aerobic Exercise: - Group verbal and visual presentation on the components of exercise prescription. Introduces F.I.T.T principle from ACSM for exercise prescriptions.  Reviews F.I.T.T. principles of aerobic exercise including progression. Written material provided at class time.   Education: Resistance Exercise: - Group verbal and visual presentation on the components of exercise prescription. Introduces F.I.T.T principle from ACSM for exercise prescriptions  Reviews F.I.T.T. principles of resistance exercise including progression. Written material provided at class time.    Education: Exercise & Equipment Safety: - Individual verbal instruction and demonstration of equipment use and safety with use of the equipment.   Education: Exercise Physiology & General Exercise Guidelines: - Group verbal  and written instruction with models to review the exercise physiology of the cardiovascular system and associated critical values. Provides general exercise guidelines with specific guidelines to those with heart or lung disease. Written material provided at class time.   Education: Flexibility, Balance, Mind/Body Relaxation: - Group verbal and visual presentation with interactive activity on the components of exercise prescription. Introduces F.I.T.T principle from ACSM for exercise prescriptions. Reviews F.I.T.T. principles of flexibility and balance exercise training including progression. Also discusses the mind body connection.  Reviews various relaxation techniques to help reduce and manage stress (i.e. Deep breathing, progressive muscle relaxation, and visualization). Balance handout provided to take home. Written material provided at class time.   Activity Barriers & Risk Stratification:  Activity Barriers & Cardiac Risk Stratification - 11/30/23 1036       Activity Barriers & Cardiac Risk Stratification   Activity Barriers Other (comment)    Comments Right rotator cuff problem. Left ankle reconstruction.    Cardiac Risk Stratification Low          6 Minute Walk:  6 Minute Walk     Row Name 11/30/23 1052         6 Minute Walk   Phase Initial     Distance 1645 feet     Walk Time 6 minutes     # of Rest Breaks 0     MPH 3.11     METS 3.54     RPE 7     Perceived Dyspnea  0     VO2 Peak 12.4     Symptoms No     Resting HR 58 bpm     Resting BP 110/70     Resting Oxygen Saturation  98 %     Exercise Oxygen Saturation  during 6 min walk 96 %     Max Ex. HR 93 bpm     Max Ex. BP 130/60     2 Minute Post BP 118/60        Oxygen Initial Assessment:   Oxygen Re-Evaluation:   Oxygen Discharge (Final Oxygen Re-Evaluation):   Initial Exercise Prescription:  Initial Exercise Prescription - 11/30/23 1200       Date of Initial Exercise RX and Referring Provider    Date 11/30/23    Referring Provider Anner Alm ORN, MD    Expected Discharge Date 02/25/24      Treadmill   MPH 3    Grade 0    Minutes 15    METs 3.3      NuStep   Level 2    SPM 85    Minutes 15    METs 2.8      Prescription Details   Frequency (times per week) 3    Duration Progress to 30 minutes of continuous aerobic without signs/symptoms of physical distress      Intensity   THRR 40-80% of Max Heartrate 62-124    Ratings of Perceived Exertion 11-13  Perceived Dyspnea 0-4      Progression   Progression Continue to progress workloads to maintain intensity without signs/symptoms of physical distress.      Resistance Training   Training Prescription Yes    Weight 3 lbs    Reps 10-15          Perform Capillary Blood Glucose checks as needed.  Exercise Prescription Changes:   Exercise Prescription Changes     Row Name 12/06/23 1035 12/20/23 1035 01/03/24 1034 01/12/24 1016 01/24/24 1029     Response to Exercise   Blood Pressure (Admit) 130/72 108/66 108/64 112/62 118/62   Blood Pressure (Exercise) 138/68 138/64 132/70 -- --   Blood Pressure (Exit) 104/68 118/62 112/72 100/60 98/70   Heart Rate (Admit) 59 bpm 58 bpm 58 bpm 62 bpm 58 bpm   Heart Rate (Exercise) 100 bpm 91 bpm 88 bpm 101 bpm 103 bpm   Heart Rate (Exit) 68 bpm 66 bpm 63 bpm 69 bpm 67 bpm   Rating of Perceived Exertion (Exercise) 11 11 12 12 12    Symptoms None None None None None   Comments Off to a good start with exercise. -- Increased workload on TM to 2.2/0. Increased hand weights today from 3 to 4 lbs. Increased workload on the treadmill. --   Duration Continue with 30 min of aerobic exercise without signs/symptoms of physical distress. Continue with 30 min of aerobic exercise without signs/symptoms of physical distress. Continue with 30 min of aerobic exercise without signs/symptoms of physical distress. Continue with 30 min of aerobic exercise without signs/symptoms of physical  distress. Continue with 30 min of aerobic exercise without signs/symptoms of physical distress.   Intensity THRR unchanged THRR unchanged THRR unchanged THRR unchanged THRR unchanged     Progression   Progression Continue to progress workloads to maintain intensity without signs/symptoms of physical distress. Continue to progress workloads to maintain intensity without signs/symptoms of physical distress. Continue to progress workloads to maintain intensity without signs/symptoms of physical distress. Continue to progress workloads to maintain intensity without signs/symptoms of physical distress. Continue to progress workloads to maintain intensity without signs/symptoms of physical distress.   Average METs 2.1 2.4 2.6 3 3.1     Resistance Training   Training Prescription Yes Yes Yes No Yes   Weight 3 lbs 3 lbs 4 lbs Relaxation day, no weights. 4 lbs   Reps 10-15 10-15 10-15 -- 10-15   Time 5 Minutes 5 Minutes 5 Minutes -- 5 Minutes     Interval Training   Interval Training No No No No No     Treadmill   MPH 1.7 1.7 2.2 2.8 2.8   Grade 0 0 0 0 0   Minutes 15 15 15 15 15    METs 2.3 2.3 2.69 3.14 3.14     NuStep   Level 2 2 2 2 4    SPM 78 91 92 88 97   Minutes 15 15 15 15 15    METs 2 2.3 2.6 2.8 3     Home Exercise Plan   Plans to continue exercise at -- -- -- Home (comment)  Walking, yoga Home (comment)  Walking, yoga   Frequency -- -- -- Add 4 additional days to program exercise sessions. Add 4 additional days to program exercise sessions.   Initial Home Exercises Provided -- -- -- 01/10/24 01/10/24    Row Name 02/09/24 1031 02/21/24 1027           Response to Exercise   Blood  Pressure (Admit) 122/60 122/64      Blood Pressure (Exercise) 128/64 --      Blood Pressure (Exit) 112/64 94/68      Heart Rate (Admit) 64 bpm 63 bpm      Heart Rate (Exercise) 103 bpm 104 bpm      Heart Rate (Exit) 71 bpm 72 bpm      Rating of Perceived Exertion (Exercise) 12 11      Symptoms  None None      Comments Reviewed goals with [name]. Reviewed METs with [name].      Duration Continue with 30 min of aerobic exercise without signs/symptoms of physical distress. Continue with 30 min of aerobic exercise without signs/symptoms of physical distress.      Intensity THRR unchanged THRR unchanged        Progression   Progression Continue to progress workloads to maintain intensity without signs/symptoms of physical distress. Continue to progress workloads to maintain intensity without signs/symptoms of physical distress.      Average METs 2.9 2.9        Resistance Training   Training Prescription No Yes      Weight Relaxation day, no weights 4lb, 2lbs      Reps -- 10-15      Time -- 5 Minutes        Interval Training   Interval Training No No        Treadmill   MPH 2.8 3      Grade 0 0      Minutes 15 15      METs 3.14 3.3        NuStep   Level 4 4      SPM 93 85      Minutes 15 15      METs 2.7 2.5        Home Exercise Plan   Plans to continue exercise at Home (comment)  Walking, yoga Home (comment)  Walking, yoga      Frequency Add 4 additional days to program exercise sessions. Add 4 additional days to program exercise sessions.      Initial Home Exercises Provided 01/10/24 01/10/24         Exercise Comments:   Exercise Comments     Row Name 12/06/23 1135 12/24/23 1140 01/10/24 1116 02/09/24 1111 02/21/24 1052   Exercise Comments [name] tolerated low intensity exercise well without symptoms. Oriented her to the equipement and the warm-up and cool-down stretches. Reviewed METs and goals with [name]. Reviewed home exercise guidelines and goals with [name]. Reviewed goals with [name]. Reviewed METs with [name].      Exercise Goals and Review:   Exercise Goals     Row Name 11/30/23 1036             Exercise Goals   Increase Physical Activity Yes       Intervention Provide advice, education, support and counseling about physical activity/exercise  needs.;Develop an individualized exercise prescription for aerobic and resistive training based on initial evaluation findings, risk stratification, comorbidities and participant's personal goals.       Expected Outcomes Long Term: Exercising regularly at least 3-5 days a week.;Long Term: Add in home exercise to make exercise part of routine and to increase amount of physical activity.;Short Term: Attend rehab on a regular basis to increase amount of physical activity.       Increase Strength and Stamina Yes       Intervention Provide advice, education, support and counseling  about physical activity/exercise needs.;Develop an individualized exercise prescription for aerobic and resistive training based on initial evaluation findings, risk stratification, comorbidities and participant's personal goals.       Expected Outcomes Short Term: Increase workloads from initial exercise prescription for resistance, speed, and METs.;Short Term: Perform resistance training exercises routinely during rehab and add in resistance training at home;Long Term: Improve cardiorespiratory fitness, muscular endurance and strength as measured by increased METs and functional capacity ( )       Able to understand and use rate of perceived exertion (RPE) scale Yes       Intervention Provide education and explanation on how to use RPE scale       Expected Outcomes Short Term: Able to use RPE daily in rehab to express subjective intensity level;Long Term:  Able to use RPE to guide intensity level when exercising independently       Knowledge and understanding of Target Heart Rate Range (THRR) Yes       Intervention Provide education and explanation of THRR including how the numbers were predicted and where they are located for reference       Expected Outcomes Short Term: Able to state/look up THRR;Long Term: Able to use THRR to govern intensity when exercising independently;Short Term: Able to use daily as guideline for  intensity in rehab       Able to check pulse independently Yes       Intervention Provide education and demonstration on how to check pulse in carotid and radial arteries.;Review the importance of being able to check your own pulse for safety during independent exercise       Expected Outcomes Short Term: Able to explain why pulse checking is important during independent exercise;Long Term: Able to check pulse independently and accurately       Understanding of Exercise Prescription Yes       Intervention Provide education, explanation, and written materials on patient's individual exercise prescription       Expected Outcomes Short Term: Able to explain program exercise prescription;Long Term: Able to explain home exercise prescription to exercise independently          Exercise Goals Re-Evaluation :  Exercise Goals Re-Evaluation     Row Name 12/06/23 1135 12/24/23 1140 01/10/24 1116 02/09/24 1111       Exercise Goal Re-Evaluation   Exercise Goals Review Increase Physical Activity;Increase Strength and Stamina;Able to understand and use rate of perceived exertion (RPE) scale Increase Physical Activity;Increase Strength and Stamina;Able to understand and use rate of perceived exertion (RPE) scale Increase Physical Activity;Increase Strength and Stamina;Able to understand and use rate of perceived exertion (RPE) scale;Able to check pulse independently;Understanding of Exercise Prescription;Knowledge and understanding of Target Heart Rate Range (THRR) Increase Physical Activity;Increase Strength and Stamina;Able to understand and use rate of perceived exertion (RPE) scale;Able to check pulse independently;Understanding of Exercise Prescription;Knowledge and understanding of Target Heart Rate Range (THRR)    Comments [name] was able to understand and use RPE scale appropriately. Charlies is making good progess with exercise thus far. She is doing chair yoga on Tuesdays in addition to exercise at cardiac  rehab. Reviewed exercise prescription with [name]. She is walking and doing chair yoga. She has a smart watch she can use to monitor her pulse. Charlies continues to progress well with exercise. She would like to increase her speed/ incline on the treadmill but will wait for now because of a sore ankle.    Expected Outcomes Progress workloads as tolerated to help  increase strength and stamina. Continue yoga, progess workloads as tolerated. Continue daily exercise to improve cardiorespiratory fitness. Continue to progress workloads as tolerated.       Discharge Exercise Prescription (Final Exercise Prescription Changes):  Exercise Prescription Changes - 02/21/24 1027       Response to Exercise   Blood Pressure (Admit) 122/64    Blood Pressure (Exit) 94/68    Heart Rate (Admit) 63 bpm    Heart Rate (Exercise) 104 bpm    Heart Rate (Exit) 72 bpm    Rating of Perceived Exertion (Exercise) 11    Symptoms None    Comments Reviewed METs with [name].    Duration Continue with 30 min of aerobic exercise without signs/symptoms of physical distress.    Intensity THRR unchanged      Progression   Progression Continue to progress workloads to maintain intensity without signs/symptoms of physical distress.    Average METs 2.9      Resistance Training   Training Prescription Yes    Weight 4lb, 2lbs    Reps 10-15    Time 5 Minutes      Interval Training   Interval Training No      Treadmill   MPH 3    Grade 0    Minutes 15    METs 3.3      NuStep   Level 4    SPM 85    Minutes 15    METs 2.5      Home Exercise Plan   Plans to continue exercise at Home (comment)   Walking, yoga   Frequency Add 4 additional days to program exercise sessions.    Initial Home Exercises Provided 01/10/24          Nutrition:  Target Goals: Understanding of nutrition guidelines, daily intake of sodium 1500mg , cholesterol 200mg , calories 30% from fat and 7% or less from saturated fats, daily to have 5 or  more servings of fruits and vegetables.  Education: Nutrition 1 -Group instruction provided by verbal, written material, interactive activities, discussions, models, and posters to present general guidelines for heart healthy nutrition including macronutrients, label reading, and promoting whole foods over processed counterparts. Education serves as Pensions consultant of discussion of heart healthy eating for all. Written material provided at class time.    Education: Nutrition 2 -Group instruction provided by verbal, written material, interactive activities, discussions, models, and posters to present general guidelines for heart healthy nutrition including sodium, cholesterol, and saturated fat. Providing guidance of habit forming to improve blood pressure, cholesterol, and body weight. Written material provided at class time.     Biometrics:  Pre Biometrics - 11/30/23 1058       Pre Biometrics   Waist Circumference 39.25 inches    Hip Circumference 46 inches    Waist to Hip Ratio 0.85 %    Triceps Skinfold 44 mm    % Body Fat 43.5 %    Grip Strength 20 kg    Flexibility 17.38 in    Single Leg Stand 20 seconds           Nutrition Therapy Plan and Nutrition Goals:  Nutrition Therapy & Goals - 01/10/24 1119       Nutrition Therapy   Diet Heart Healthy Diet    Drug/Food Interactions Statins/Certain Fruits      Personal Nutrition Goals   Nutrition Goal Patient to identify strategies for reducing cardiovascular risk by attending the [employer] education and nutrition series weekly.   goal not met.  Personal Goal #2 Patient to improve diet quality by using the plate method as a guide for meal planning to include lean protein/plant protein, fruits, vegetables, whole grains, nonfat dairy as part of a well-balanced diet.    Comments Goals in progress. [name] has medical history of HTN, hyperlipidemia, CAD s/p coronary artery stent placement. LDL is at goal. Triglycerides remain elevated.  She is motivated to lose weight; will continue to discuss strategies for weight loss including calorie density, label reading, the plate method as a guide for meal planning,etc. She does not attend the [facility] education/nutrition series regularly. She has maintained her weight since starting with our program. Patient will benefit from participation in intensive cardiac rehab for nutrition education, exercise, and lifestyle modification.      Intervention Plan   Intervention Prescribe, educate and counsel regarding individualized specific dietary modifications aiming towards targeted core components such as weight, hypertension, lipid management, diabetes, heart failure and other comorbidities.;Nutrition handout(s) given to patient.    Expected Outcomes Short Term Goal: Understand basic principles of dietary content, such as calories, fat, sodium, cholesterol and nutrients.;Long Term Goal: Adherence to prescribed nutrition plan.          Nutrition Assessments:  Nutrition Assessments - 02/22/24 0855       Rate Your Plate Scores   Pre Score 54         MEDIFICTS Score Key: >=70 Need to make dietary changes  40-70 Heart Healthy Diet <= 40 Therapeutic Level Cholesterol Diet  Flowsheet Row INTENSIVE CARDIAC REHAB from 02/21/2024 in [facility]  Picture Your Plate Total Score on Admission 54  Picture Your Plate Total Score on Discharge 64   Picture Your Plate Scores: <59 Unhealthy dietary pattern with much room for improvement. 41-50 Dietary pattern unlikely to meet recommendations for good health and room for improvement. 51-60 More healthful dietary pattern, with some room for improvement.  >60 Healthy dietary pattern, although there may be some specific behaviors that could be improved.    Nutrition Goals Re-Evaluation:  Nutrition Goals Re-Evaluation     Row Name 12/06/23 1146 01/10/24 1119           Goals   Current  Weight 203 lb 7.8 oz (92.3 kg) 203 lb 7.8 oz (92.3 kg)      Comment LDL 41, triglycerides 179, HDL 48 no new labs; most recent labs  LDL 41, triglycerides 179, HDL 48      Expected Outcome [name] has medical history of HTN, hyperlipidemia, CAD s/p coronary artery stent placement.LDL is at goal. Triglycerides remain elevated. She is motivated to lose weight; will continue to discuss strategies for weight loss including calorie density, label reading, the plate method as a guide for meal planning,etc. Patient will benefit from participation in intensive cardiac rehab for nutrition education, exercise, and lifestyle modification. Goals in progress. [name] has medical history of HTN, hyperlipidemia, CAD s/p coronary artery stent placement. LDL is at goal. Triglycerides remain elevated. She is motivated to lose weight; will continue to discuss strategies for weight loss including calorie density, label reading, the plate method as a guide for meal planning,etc. She does not attend the [facility] education/nutrition series regularly. She has maintained her weight since starting with our program. Patient will benefit from participation in intensive cardiac rehab for nutrition education, exercise, and lifestyle modification.         Nutrition Goals Discharge (Final Nutrition Goals Re-Evaluation):  Nutrition Goals Re-Evaluation - 01/10/24 1119  Goals   Current Weight 203 lb 7.8 oz (92.3 kg)    Comment no new labs; most recent labs  LDL 41, triglycerides 179, HDL 48    Expected Outcome Goals in progress. [name] has medical history of HTN, hyperlipidemia, CAD s/p coronary artery stent placement. LDL is at goal. Triglycerides remain elevated. She is motivated to lose weight; will continue to discuss strategies for weight loss including calorie density, label reading, the plate method as a guide for meal planning,etc. She does not attend the [facility] education/nutrition series regularly. She has maintained her  weight since starting with our program. Patient will benefit from participation in intensive cardiac rehab for nutrition education, exercise, and lifestyle modification.          Psychosocial: Target Goals: Acknowledge presence or absence of significant depression and/or stress, maximize coping skills, provide positive support system. Participant is able to verbalize types and ability to use techniques and skills needed for reducing stress and depression.   Education: Stress, Anxiety, and Depression - Group verbal and visual presentation to define topics covered.  Reviews how body is impacted by stress, anxiety, and depression.  Also discusses healthy ways to reduce stress and to treat/manage anxiety and depression. Written material provided at class time.   Education: Sleep Hygiene -Provides group verbal and written instruction about how sleep can affect your health.  Define sleep hygiene, discuss sleep cycles and impact of sleep habits. Review good sleep hygiene tips.   Initial Review & Psychosocial Screening:  Initial Psych Review & Screening - 11/30/23 1101       Initial Review   Current issues with Current Stress Concerns    Source of Stress Concerns Chronic Illness    Comments Charlies denies having depression or anxiety. [name] mention having some health concerns regarding her right shoulder and mid lower back      Family Dynamics   Good Support System? Yes   [name] lives alone. Charlies has a nephew she can rely on for support. Charlies has 2 brothers who live in the area   Comments Charlies says she mainly relies on herself      Barriers   Psychosocial barriers to participate in program The patient should benefit from training in stress management and relaxation.      Screening Interventions   Interventions Encouraged to exercise    Expected Outcomes Long Term Goal: Stressors or current issues are controlled or eliminated.          Quality of Life Scores:   Quality of Life -  02/22/24 0855       Quality of Life   Select Quality of Life      Quality of Life Scores   Health/Function Pre 28.8 %    Health/Function Post 29.82 %    Health/Function % Change 3.54 %    Socioeconomic Pre 29.64 %    Socioeconomic Post 26.43 %    Socioeconomic % Change  -10.83 %    Psych/Spiritual Pre 30 %    Psych/Spiritual Post 30 %    Psych/Spiritual % Change 0 %    Family Pre 20.4 %    Family Post 30 %    Family % Change 47.06 %    GLOBAL Pre 27.99 %    GLOBAL Post 29.17 %    GLOBAL % Change 4.22 %         Scores of 19 and below usually indicate a poorer quality of life in these areas.  A difference of  2-3 points is a clinically meaningful difference.  A difference of 2-3 points in the total score of the Quality of Life Index has been associated with significant improvement in overall quality of life, self-image, physical symptoms, and general health in studies assessing change in quality of life.  PHQ-9: Review Flowsheet  More data exists      02/22/2024 11/30/2023 07/10/2022 07/15/2021 04/30/2021  Depression screen PHQ 2/9  Decreased Interest 0 0 0 3 0  Down, Depressed, Hopeless 0 0 0 0 0  PHQ - 2 Score 0 0 0 3 0  Altered sleeping 0 0 - 3 3  Tired, decreased energy 0 0 - 2 0  Change in appetite 0 0 - 0 0  Feeling bad or failure about yourself  0 0 - 0 0  Trouble concentrating 0 0 - 0 0  Moving slowly or fidgety/restless 0 0 - 3 0  Suicidal thoughts 0 0 - 0 0  PHQ-9 Score 0 0 - 11 3  Difficult doing work/chores - Not difficult at all - - -   Interpretation of Total Score  Total Score Depression Severity:  1-4 = Minimal depression, 5-9 = Mild depression, 10-14 = Moderate depression, 15-19 = Moderately severe depression, 20-27 = Severe depression   Psychosocial Evaluation and Intervention:   Psychosocial Re-Evaluation:  Psychosocial Re-Evaluation     Row Name 12/06/23 1716 12/28/23 1408 01/24/24 1709 02/22/24 0825       Psychosocial Re-Evaluation   Current  issues with Current Stress Concerns Current Stress Concerns Current Stress Concerns Current Stress Concerns    Comments [name] did not voice any increased concerns or stressors on her first day of exercise [name] continues not voice any increased concerns or stressors during exercise at cardiac rehab. Charlies continues not voice any increased concerns or stressors during exercise at cardiac rehab. Charlies continues not voice any increased concerns or stressors during exercise at cardiac rehab. Charlies will tenatively complete cardiac rehab on 03/08/24    Expected Outcomes [name] will have controlled or decreased stressors upon completion of cardiac rehab Charlies will have controlled or decreased stressors upon completion of cardiac rehab Charlies will have controlled or decreased stressors upon completion of cardiac rehab [name] will have controlled or decreased stressors upon completion of cardiac rehab    Interventions Stress management education;Relaxation education;Encouraged to attend Cardiac Rehabilitation for the exercise Stress management education;Relaxation education;Encouraged to attend Cardiac Rehabilitation for the exercise Stress management education;Relaxation education;Encouraged to attend Cardiac Rehabilitation for the exercise Stress management education;Relaxation education;Encouraged to attend Cardiac Rehabilitation for the exercise    Continue Psychosocial Services  No Follow up required No Follow up required No Follow up required No Follow up required      Initial Review   Source of Stress Concerns Chronic Illness Chronic Illness Chronic Illness Chronic Illness    Comments will continue to monitor and offer support as needed. will continue to monitor and offer support as needed. will continue to monitor and offer support as needed. will continue to monitor and offer support as needed.       Psychosocial Discharge (Final Psychosocial Re-Evaluation):  Psychosocial Re-Evaluation - 02/22/24 0825        Psychosocial Re-Evaluation   Current issues with Current Stress Concerns    Comments Charlies continues not voice any increased concerns or stressors during exercise at cardiac rehab. Charlies will tenatively complete cardiac rehab on 03/08/24    Expected Outcomes [name] will have controlled or decreased stressors upon completion of cardiac rehab  Interventions Stress management education;Relaxation education;Encouraged to attend Cardiac Rehabilitation for the exercise    Continue Psychosocial Services  No Follow up required      Initial Review   Source of Stress Concerns Chronic Illness    Comments will continue to monitor and offer support as needed.          Vocational Rehabilitation: Provide vocational rehab assistance to qualifying candidates.   Vocational Rehab Evaluation & Intervention:  Vocational Rehab - 11/30/23 1104       Initial Vocational Rehab Evaluation & Intervention   Assessment shows need for Vocational Rehabilitation No   Charlies has been retired since 2020 and does not need vocational rehab at this time         Education: Education Goals: Education classes will be provided on a variety of topics geared toward better understanding of heart health and risk factor modification. Participant will state understanding/return demonstration of topics presented as noted by education test scores.  Learning Barriers/Preferences:  Learning Barriers/Preferences - 11/30/23 1105       Learning Barriers/Preferences   Learning Barriers Sight;Exercise Concerns   Wears reading glasses. Has pain in right shoulder and has a cyst on her mid  lower back   Learning Preferences Skilled Demonstration          General Cardiac Education Topics:  AED/CPR: - Group verbal and written instruction with the use of models to demonstrate the basic use of the AED with the basic ABC's of resuscitation.   Test and Procedures: - Group verbal and visual presentation and models provide  information about basic cardiac anatomy and function. Reviews the testing methods done to diagnose heart disease and the outcomes of the test results. Describes the treatment choices: Medical Management, Angioplasty, or Coronary Bypass Surgery for treating various heart conditions including Myocardial Infarction, Angina, Valve Disease, and Cardiac Arrhythmias. Written material provided at class time.   Medication Safety: - Group verbal and visual instruction to review commonly prescribed medications for heart and lung disease. Reviews the medication, class of the drug, and side effects. Includes the steps to properly store meds and maintain the prescription regimen. Written material provided at class time.   Intimacy: - Group verbal instruction through game format to discuss how heart and lung disease can affect sexual intimacy. Written material provided at class time.   Know Your Numbers and Heart Failure: - Group verbal and visual instruction to discuss disease risk factors for cardiac and pulmonary disease and treatment options.  Reviews associated critical values for Overweight/Obesity, Hypertension, Cholesterol, and Diabetes.  Discusses basics of heart failure: signs/symptoms and treatments.  Introduces Heart Failure Zone chart for action plan for heart failure. Written material provided at class time.   Infection Prevention: - Provides verbal and written material to individual with discussion of infection control including proper hand washing and proper equipment cleaning during exercise session.   Falls Prevention: - Provides verbal and written material to individual with discussion of falls prevention and safety.   Other: -Provides group and verbal instruction on various topics (see comments)   Knowledge Questionnaire Score:  Knowledge Questionnaire Score - 02/22/24 0853       Knowledge Questionnaire Score   Pre Score 22/24    Post Score 16/24          Core  Components/Risk Factors/Patient Goals at Admission:  Personal Goals and Risk Factors at Admission - 11/30/23 1036       Core Components/Risk Factors/Patient Goals on Admission    Weight Management  Yes;Obesity;Weight Loss    Intervention Weight Management/Obesity: Establish reasonable short term and long term weight goals.;Obesity: Provide education and appropriate resources to help participant work on and attain dietary goals.    Admit Weight 203 lb 7.8 oz (92.3 kg)    Expected Outcomes Short Term: Continue to assess and modify interventions until short term weight is achieved;Long Term: Adherence to nutrition and physical activity/exercise program aimed toward attainment of established weight goal;Weight Loss: Understanding of general recommendations for a balanced deficit meal plan, which promotes 1-2 lb weight loss per week and includes a negative energy balance of [phone] kcal/d    Hypertension Yes    Intervention Provide education on lifestyle modifcations including regular physical activity/exercise, weight management, moderate sodium restriction and increased consumption of fresh fruit, vegetables, and low fat dairy, alcohol moderation, and smoking cessation.;Monitor prescription use compliance.    Expected Outcomes Short Term: Continued assessment and intervention until BP is < 140/60mm HG in hypertensive participants. < 130/50mm HG in hypertensive participants with diabetes, heart failure or chronic kidney disease.;Long Term: Maintenance of blood pressure at goal levels.    Lipids Yes    Intervention Provide education and support for participant on nutrition & aerobic/resistive exercise along with prescribed medications to achieve LDL 70mg , HDL >40mg .    Expected Outcomes Short Term: Participant states understanding of desired cholesterol values and is compliant with medications prescribed. Participant is following exercise prescription and nutrition guidelines.;Long Term: Cholesterol  controlled with medications as prescribed, with individualized exercise RX and with personalized nutrition plan. Value goals: LDL < 70mg , HDL > 40 mg.          Education:Diabetes - Individual verbal and written instruction to review signs/symptoms of diabetes, desired ranges of glucose level fasting, after meals and with exercise. Acknowledge that pre and post exercise glucose checks will be done for 3 sessions at entry of program.   Core Components/Risk Factors/Patient Goals Review:   Goals and Risk Factor Review     Row Name 12/06/23 1718 12/28/23 1410 01/24/24 1710 02/22/24 0826       Core Components/Risk Factors/Patient Goals Review   Personal Goals Review Weight Management/Obesity;Hypertension;Lipids Weight Management/Obesity;Hypertension;Lipids Weight Management/Obesity;Hypertension;Lipids Weight Management/Obesity;Hypertension;Lipids    Review [name] started cardiac rehab on 12/06/23. [name] did well with exercise. vital signs were stable. Charlies is doing well with exercise at cardiac rehab.  vital signs have been stable. Charlies has increased her work loads Charlies continues to  do well with exercise at cardiac rehab.  vital signs have been stable. Charlies has increased her work loads Charlies continues to  do well with exercise at cardiac rehab.  vital signs remain stable. [name] continues to  increase her work loads. Charlies will tenatively complete cardiac rehab on 03/08/24.    Expected Outcomes Reneee will continue to participate in cardiac rehab for exercise, nutrition and lifestyle modifications. Charlies will continue to participate in cardiac rehab for exercise, nutrition and lifestyle modifications. Charlies will continue to participate in cardiac rehab for exercise, nutrition and lifestyle modifications. Charlies will continue to participate in cardiac rehab for exercise, nutrition and lifestyle modifications.       Core Components/Risk Factors/Patient Goals at Discharge (Final Review):   Goals and  Risk Factor Review - 02/22/24 0826       Core Components/Risk Factors/Patient Goals Review   Personal Goals Review Weight Management/Obesity;Hypertension;Lipids    Review Charlies continues to  do well with exercise at cardiac rehab.  vital signs remain stable. Charlies continues to  increase her work  loads. Charlies will tenatively complete cardiac rehab on 03/08/24.    Expected Outcomes [name] will continue to participate in cardiac rehab for exercise, nutrition and lifestyle modifications.          ITP Comments:  ITP Comments     Row Name 11/30/23 1059 12/06/23 1715 12/28/23 1406 01/24/24 1708 02/22/24 0823   ITP Comments Introduction to [facility] Education Program/Intensive Cardiac Rehab. Initial Orientation Packet reviewed with the patient 30 Day ITP Review. [name] started cardiac rehab on 12/06/23. [name] did well with exercise. 30 Day ITP Review. Charlies has good attendance and participation with exercise at cardiac rehab. 30 Day ITP Review. Charlies continues to have  good  participation with exercise at cardiac rehab when in attendance.. 30 Day ITP Review. Charlies continues to have  good  participation with exercise at cardiac rehab when in attendance. Charlies will tenatively complete cardiac rehab on 03/08/24      Comments: See ITP Comments

## 2024-02-23 ENCOUNTER — Encounter (HOSPITAL_COMMUNITY)
Admission: RE | Admit: 2024-02-23 | Discharge: 2024-02-23 | Disposition: A | Discharge status: Home / Self Care | Source: Ambulatory Visit | Attending: Cardiology | Admitting: Cardiology

## 2024-02-23 DIAGNOSIS — Z955 Presence of coronary angioplasty implant and graft: Secondary | ICD-10-CM | POA: Diagnosis not present

## 2024-02-25 ENCOUNTER — Encounter (HOSPITAL_COMMUNITY)

## 2024-02-28 ENCOUNTER — Encounter (HOSPITAL_COMMUNITY)
Admission: RE | Admit: 2024-02-28 | Discharge: 2024-02-28 | Disposition: A | Source: Ambulatory Visit | Attending: Cardiology

## 2024-02-28 DIAGNOSIS — Z955 Presence of coronary angioplasty implant and graft: Secondary | ICD-10-CM

## 2024-03-01 ENCOUNTER — Encounter (HOSPITAL_COMMUNITY)
Admission: RE | Admit: 2024-03-01 | Discharge: 2024-03-01 | Disposition: A | Discharge status: Home / Self Care | Source: Ambulatory Visit | Attending: Cardiology | Admitting: Cardiology

## 2024-03-01 DIAGNOSIS — Z955 Presence of coronary angioplasty implant and graft: Secondary | ICD-10-CM | POA: Diagnosis not present

## 2024-03-06 ENCOUNTER — Encounter (HOSPITAL_COMMUNITY)
Admission: RE | Admit: 2024-03-06 | Discharge: 2024-03-06 | Disposition: A | Source: Ambulatory Visit | Attending: Cardiology

## 2024-03-06 DIAGNOSIS — Z955 Presence of coronary angioplasty implant and graft: Secondary | ICD-10-CM | POA: Diagnosis not present

## 2024-03-08 ENCOUNTER — Encounter (HOSPITAL_COMMUNITY)
Admission: RE | Admit: 2024-03-08 | Discharge: 2024-03-08 | Disposition: A | Discharge status: Home / Self Care | Source: Ambulatory Visit | Attending: Cardiology | Admitting: Cardiology

## 2024-03-08 VITALS — BP 120/72 | HR 57 | Ht 68.5 in | Wt 207.5 lb

## 2024-03-08 DIAGNOSIS — Z955 Presence of coronary angioplasty implant and graft: Secondary | ICD-10-CM

## 2024-03-08 NOTE — Progress Notes (Signed)
 Discharge Progress Report  Patient Details  Name: Morgan Wolfe MRN: [id number] Date of Birth: [birth year] Referring Provider:   Flowsheet Row INTENSIVE CARDIAC REHAB ORIENT from 11/30/2023 in [facility]  Referring Provider Anner Alm ORN, MD     Number of Visits: 28  Reason for Discharge:  Patient reached a stable level of exercise. Patient independent in their exercise. Patient has met program and personal goals.  Smoking History:  Social History   Tobacco Use  Smoking Status Never  Smokeless Tobacco Never    Diagnosis:  10/20/23 S/P DES mRCA, S/P DES CTO mLAD  ADL UCSD:   Initial Exercise Prescription:  Initial Exercise Prescription - 11/30/23 1200       Date of Initial Exercise RX and Referring Provider   Date 11/30/23    Referring Provider Anner Alm ORN, MD    Expected Discharge Date 02/25/24      Treadmill   MPH 3    Grade 0    Minutes 15    METs 3.3      NuStep   Level 2    SPM 85    Minutes 15    METs 2.8      Prescription Details   Frequency (times per week) 3    Duration Progress to 30 minutes of continuous aerobic without signs/symptoms of physical distress      Intensity   THRR 40-80% of Max Heartrate 62-124    Ratings of Perceived Exertion 11-13    Perceived Dyspnea 0-4      Progression   Progression Continue to progress workloads to maintain intensity without signs/symptoms of physical distress.      Resistance Training   Training Prescription Yes    Weight 3 lbs    Reps 10-15          Discharge Exercise Prescription (Final Exercise Prescription Changes):  Exercise Prescription Changes - 03/08/24 1027       Response to Exercise   Blood Pressure (Admit) 120/72    Blood Pressure (Exit) 120/76    Heart Rate (Admit) 57 bpm    Heart Rate (Exercise) 91 bpm    Heart Rate (Exit) 65 bpm    Rating of Perceived Exertion (Exercise) 9    Symptoms None    Comments Morgan Wolfe  completed the cardiac rehab program today. Decreased speed on TM today due to tiredness.    Duration Continue with 30 min of aerobic exercise without signs/symptoms of physical distress.    Intensity THRR unchanged      Progression   Progression Continue to progress workloads to maintain intensity without signs/symptoms of physical distress.    Average METs 2.9      Resistance Training   Training Prescription No    Weight Relaxation day, no weights.      Interval Training   Interval Training No      Treadmill   MPH 2.6    Grade 0    Minutes 15    METs 2.99      NuStep   Level 4    SPM 102    Minutes 15    METs 2.9      Home Exercise Plan   Plans to continue exercise at Home (comment)   Walking, yoga   Frequency Add 4 additional days to program exercise sessions.    Initial Home Exercises Provided 01/10/24          Functional Capacity:  6 Minute  Walk     Row Name 11/30/23 1052 03/01/24 1041       6 Minute Walk   Phase Initial Discharge    Distance 1645 feet 1991 feet    Distance % Change -- 21.03 %    Distance Feet Change -- 346 ft    Walk Time 6 minutes 6 minutes    # of Rest Breaks 0 0    MPH 3.11 3.77    METS 3.54 4.37    RPE 7 11    Perceived Dyspnea  0 0    VO2 Peak 12.4 15.29    Symptoms No Yes (comment)    Comments -- Dizziness, resolved with rest    Resting HR 58 bpm 62 bpm    Resting BP 110/70 118/62    Resting Oxygen Saturation  98 % --    Exercise Oxygen Saturation  during 6 min walk 96 % 100 %    Max Ex. HR 93 bpm 103 bpm    Max Ex. BP 130/60 152/54    2 Minute Post BP 118/60 128/60       Psychological, QOL, Others - Outcomes: PHQ 2/9:    02/22/2024    8:53 AM 11/30/2023   12:31 PM 07/10/2022   11:27 AM 07/15/2021    3:49 PM 04/30/2021    9:05 AM  Depression screen PHQ 2/9  Decreased Interest 0 0 0 3 0  Down, Depressed, Hopeless 0 0 0 0 0  PHQ - 2 Score 0 0 0 3 0  Altered sleeping 0 0  3 3  Tired, decreased energy 0 0  2 0   Change in appetite 0 0  0 0  Feeling bad or failure about yourself  0 0  0 0  Trouble concentrating 0 0  0 0  Moving slowly or fidgety/restless 0 0  3 0  Suicidal thoughts 0 0  0 0  PHQ-9 Score 0 0  11 3  Difficult doing work/chores  Not difficult at all       Quality of Life:  Quality of Life - 02/22/24 0855       Quality of Life   Select Quality of Life      Quality of Life Scores   Health/Function Pre 28.8 %    Health/Function Post 29.82 %    Health/Function % Change 3.54 %    Socioeconomic Pre 29.64 %    Socioeconomic Post 26.43 %    Socioeconomic % Change  -10.83 %    Psych/Spiritual Pre 30 %    Psych/Spiritual Post 30 %    Psych/Spiritual % Change 0 %    Family Pre 20.4 %    Family Post 30 %    Family % Change 47.06 %    GLOBAL Pre 27.99 %    GLOBAL Post 29.17 %    GLOBAL % Change 4.22 %          Personal Goals: Goals established at orientation with interventions provided to work toward goal.  Personal Goals and Risk Factors at Admission - 11/30/23 1036       Core Components/Risk Factors/Patient Goals on Admission    Weight Management Yes;Obesity;Weight Loss    Intervention Weight Management/Obesity: Establish reasonable short term and long term weight goals.;Obesity: Provide education and appropriate resources to help participant work on and attain dietary goals.    Admit Weight 203 lb 7.8 oz (92.3 kg)    Expected Outcomes Short Term: Continue to assess and modify interventions  until short term weight is achieved;Long Term: Adherence to nutrition and physical activity/exercise program aimed toward attainment of established weight goal;Weight Loss: Understanding of general recommendations for a balanced deficit meal plan, which promotes 1-2 lb weight loss per week and includes a negative energy balance of [phone] kcal/d    Hypertension Yes    Intervention Provide education on lifestyle modifcations including regular physical activity/exercise, weight  management, moderate sodium restriction and increased consumption of fresh fruit, vegetables, and low fat dairy, alcohol moderation, and smoking cessation.;Monitor prescription use compliance.    Expected Outcomes Short Term: Continued assessment and intervention until BP is < 140/46mm HG in hypertensive participants. < 130/63mm HG in hypertensive participants with diabetes, heart failure or chronic kidney disease.;Long Term: Maintenance of blood pressure at goal levels.    Lipids Yes    Intervention Provide education and support for participant on nutrition & aerobic/resistive exercise along with prescribed medications to achieve LDL 70mg , HDL >40mg .    Expected Outcomes Short Term: Participant states understanding of desired cholesterol values and is compliant with medications prescribed. Participant is following exercise prescription and nutrition guidelines.;Long Term: Cholesterol controlled with medications as prescribed, with individualized exercise RX and with personalized nutrition plan. Value goals: LDL < 70mg , HDL > 40 mg.           Personal Goals Discharge:  Goals and Risk Factor Review     Row Name 12/06/23 1718 12/28/23 1410 01/24/24 1710 02/22/24 0826       Core Components/Risk Factors/Patient Goals Review   Personal Goals Review Weight Management/Obesity;Hypertension;Lipids Weight Management/Obesity;Hypertension;Lipids Weight Management/Obesity;Hypertension;Lipids Weight Management/Obesity;Hypertension;Lipids    Review Morgan Wolfe started cardiac rehab on 12/06/23. Morgan Wolfe did well with exercise. vital signs were stable. Morgan Wolfe is doing well with exercise at cardiac rehab.  vital signs have been stable. Morgan Wolfe has increased her work loads Morgan Wolfe continues to  do well with exercise at cardiac rehab.  vital signs have been stable. Morgan Wolfe has increased her work loads Morgan Wolfe continues to  do well with exercise at cardiac rehab.  vital signs remain stable. Morgan Wolfe continues to  increase her work loads.  Morgan Wolfe will tenatively complete cardiac rehab on 03/08/24.    Expected Outcomes Reneee will continue to participate in cardiac rehab for exercise, nutrition and lifestyle modifications. Morgan Wolfe will continue to participate in cardiac rehab for exercise, nutrition and lifestyle modifications. Morgan Wolfe will continue to participate in cardiac rehab for exercise, nutrition and lifestyle modifications. Morgan Wolfe will continue to participate in cardiac rehab for exercise, nutrition and lifestyle modifications.       Exercise Goals and Review:  Exercise Goals     Row Name 11/30/23 1036             Exercise Goals   Increase Physical Activity Yes       Intervention Provide advice, education, support and counseling about physical activity/exercise needs.;Develop an individualized exercise prescription for aerobic and resistive training based on initial evaluation findings, risk stratification, comorbidities and participant's personal goals.       Expected Outcomes Long Term: Exercising regularly at least 3-5 days a week.;Long Term: Add in home exercise to make exercise part of routine and to increase amount of physical activity.;Short Term: Attend rehab on a regular basis to increase amount of physical activity.       Increase Strength and Stamina Yes       Intervention Provide advice, education, support and counseling about physical activity/exercise needs.;Develop an individualized exercise prescription for aerobic and resistive training based on initial evaluation  findings, risk stratification, comorbidities and participant's personal goals.       Expected Outcomes Short Term: Increase workloads from initial exercise prescription for resistance, speed, and METs.;Short Term: Perform resistance training exercises routinely during rehab and add in resistance training at home;Long Term: Improve cardiorespiratory fitness, muscular endurance and strength as measured by increased METs and functional capacity ( )        Able to understand and use rate of perceived exertion (RPE) scale Yes       Intervention Provide education and explanation on how to use RPE scale       Expected Outcomes Short Term: Able to use RPE daily in rehab to express subjective intensity level;Long Term:  Able to use RPE to guide intensity level when exercising independently       Knowledge and understanding of Target Heart Rate Range (THRR) Yes       Intervention Provide education and explanation of THRR including how the numbers were predicted and where they are located for reference       Expected Outcomes Short Term: Able to state/look up THRR;Long Term: Able to use THRR to govern intensity when exercising independently;Short Term: Able to use daily as guideline for intensity in rehab       Able to check pulse independently Yes       Intervention Provide education and demonstration on how to check pulse in carotid and radial arteries.;Review the importance of being able to check your own pulse for safety during independent exercise       Expected Outcomes Short Term: Able to explain why pulse checking is important during independent exercise;Long Term: Able to check pulse independently and accurately       Understanding of Exercise Prescription Yes       Intervention Provide education, explanation, and written materials on patient's individual exercise prescription       Expected Outcomes Short Term: Able to explain program exercise prescription;Long Term: Able to explain home exercise prescription to exercise independently          Exercise Goals Re-Evaluation:  Exercise Goals Re-Evaluation     Row Name 12/06/23 1135 12/24/23 1140 01/10/24 1116 02/09/24 1111 03/01/24 1051     Exercise Goal Re-Evaluation   Exercise Goals Review Increase Physical Activity;Increase Strength and Stamina;Able to understand and use rate of perceived exertion (RPE) scale Increase Physical Activity;Increase Strength and Stamina;Able to understand and use  rate of perceived exertion (RPE) scale Increase Physical Activity;Increase Strength and Stamina;Able to understand and use rate of perceived exertion (RPE) scale;Able to check pulse independently;Understanding of Exercise Prescription;Knowledge and understanding of Target Heart Rate Range (THRR) Increase Physical Activity;Increase Strength and Stamina;Able to understand and use rate of perceived exertion (RPE) scale;Able to check pulse independently;Understanding of Exercise Prescription;Knowledge and understanding of Target Heart Rate Range (THRR) Increase Physical Activity;Increase Strength and Stamina;Able to understand and use rate of perceived exertion (RPE) scale;Able to check pulse independently;Understanding of Exercise Prescription;Knowledge and understanding of Target Heart Rate Range (THRR)   Comments Morgan Wolfe was able to understand and use RPE scale appropriately. Morgan Wolfe is making good progess with exercise thus far. She is doing chair yoga on Tuesdays in addition to exercise at cardiac rehab. Reviewed exercise prescription with Morgan Wolfe. She is walking and doing chair yoga. She has a smart watch she can use to monitor her pulse. Morgan Wolfe continues to progress well with exercise. She would like to increase her speed/ incline on the treadmill but will wait for now because of a sore  ankle. Morgan Wolfe will complete the cardiac rehab program next week. Her functional capacity has improved 21% as measured by . Reviewed dischard exercise action plan. She is limited by chronic back problems, sore left ankle, and right shoulder. She notes that she is doing more now than when she started the program. She is doing chair yoga on line every other Thursday and in-person at [facility] every other Monday. She walks daily with her dog.   Expected Outcomes Progress workloads as tolerated to help increase strength and stamina. Continue yoga, progess workloads as tolerated. Continue daily exercise to improve  cardiorespiratory fitness. Continue to progress workloads as tolerated. Morgan Wolfe will continue walking and chair yoga as her mode of exercise upon completion of the cardiac rehab program.    Row Name 03/08/24 1129             Exercise Goal Re-Evaluation   Exercise Goals Review Increase Physical Activity;Increase Strength and Stamina;Able to understand and use rate of perceived exertion (RPE) scale;Able to check pulse independently;Understanding of Exercise Prescription;Knowledge and understanding of Target Heart Rate Range (THRR)       Comments Morgan Wolfe completed the cardiac rehab program today and progressed well achieving 3.3 METs with exercise. She will continue walking and yoga at home and at fitness center.       Expected Outcomes Morgan Wolfe will continue walking and chair yoga as her mode of exercise to maintain health and fitness gains.          Nutrition & Weight - Outcomes:  Pre Biometrics - 11/30/23 1058       Pre Biometrics   Waist Circumference 39.25 inches    Hip Circumference 46 inches    Waist to Hip Ratio 0.85 %    Triceps Skinfold 44 mm    % Body Fat 43.5 %    Grip Strength 20 kg    Flexibility 17.38 in    Single Leg Stand 20 seconds          Post Biometrics - 03/08/24 1034        Post  Biometrics   Height 5' 8.5 (1.74 m)    Waist Circumference 40.5 inches    Hip Circumference 47 inches    Waist to Hip Ratio 0.86 %    BMI (Calculated) 31.08    Triceps Skinfold 44 mm    % Body Fat 44.3 %    Grip Strength 18 kg    Flexibility 17.75 in    Single Leg Stand 16.68 seconds          Nutrition:  Nutrition Therapy & Goals - 01/10/24 1119       Nutrition Therapy   Diet Heart Healthy Diet    Drug/Food Interactions Statins/Certain Fruits      Personal Nutrition Goals   Nutrition Goal Patient to identify strategies for reducing cardiovascular risk by attending the [employer] education and nutrition series weekly.   goal not met.   Personal Goal #2 Patient to  improve diet quality by using the plate method as a guide for meal planning to include lean protein/plant protein, fruits, vegetables, whole grains, nonfat dairy as part of a well-balanced diet.    Comments Goals in progress. Morgan Wolfe has medical history of HTN, hyperlipidemia, CAD s/p coronary artery stent placement. LDL is at goal. Triglycerides remain elevated. She is motivated to lose weight; will continue to discuss strategies for weight loss including calorie density, label reading, the plate method as a guide for meal planning,etc. She does not  attend the [facility] education/nutrition series regularly. She has maintained her weight since starting with our program. Patient will benefit from participation in intensive cardiac rehab for nutrition education, exercise, and lifestyle modification.      Intervention Plan   Intervention Prescribe, educate and counsel regarding individualized specific dietary modifications aiming towards targeted core components such as weight, hypertension, lipid management, diabetes, heart failure and other comorbidities.;Nutrition handout(s) given to patient.    Expected Outcomes Short Term Goal: Understand basic principles of dietary content, such as calories, fat, sodium, cholesterol and nutrients.;Long Term Goal: Adherence to prescribed nutrition plan.          Nutrition Discharge:  Nutrition Assessments - 02/22/24 0855       Rate Your Plate Scores   Pre Score 54          Education Questionnaire Score:  Knowledge Questionnaire Score - 02/22/24 0853       Knowledge Questionnaire Score   Pre Score 22/24    Post Score 16/24          Goals reviewed with patient; copy given to patient.Pt graduates from  Intensive/Traditional cardiac rehab program 03/08/24  with completion of  25  exercise and  3 education sessions. Pt maintained good attendance and progressed nicely during her participation in rehab as evidenced by increased MET level. Morgan Wolfe increased  her distance on her post exercise walk test by 346 feet.   Medication list reconciled. Repeat  PHQ score- 0 .  Pt has made significant lifestyle changes and should be commended for her success.  Morgan Wolfe achieved her  goals during cardiac rehab.   Pt plans to continue exercise doing chair yoga and walking we are proud of  Morgan Wolfe's progress.Hadassah Elpidio Quan RN BSN

## 2024-03-13 ENCOUNTER — Telehealth: Payer: Self-pay | Admitting: Podiatry

## 2024-03-13 NOTE — Telephone Encounter (Signed)
 Patient called wanting an update on if her insurance covered [id number]. Her balance for the orthotics is $527. Patient is aware. She asked about the power steps. I also made her aware that we offer payment plans if she would like to move forward with the CFOs. She said she will give us  a call  back when she has a plan figured out.

## 2024-03-15 NOTE — Addendum Note (Signed)
 Addended by: GERSHON COUGH R on: 03/15/2024 02:59 PM   Modules accepted: Level of Service

## 2024-03-24 ENCOUNTER — Ambulatory Visit (HOSPITAL_COMMUNITY)
Admission: RE | Admit: 2024-03-24 | Discharge: 2024-03-24 | Disposition: A | Discharge status: Home / Self Care | Source: Ambulatory Visit | Attending: Internal Medicine | Admitting: Internal Medicine

## 2024-03-24 DIAGNOSIS — R600 Localized edema: Secondary | ICD-10-CM | POA: Diagnosis present

## 2024-03-24 DIAGNOSIS — I10 Essential (primary) hypertension: Secondary | ICD-10-CM

## 2024-03-24 LAB — ECHOCARDIOGRAM COMPLETE
Area-P 1/2: 2.95 cm2
S' Lateral: 3.3 cm

## 2024-03-31 ENCOUNTER — Telehealth (HOSPITAL_BASED_OUTPATIENT_CLINIC_OR_DEPARTMENT_OTHER): Payer: Self-pay | Admitting: *Deleted

## 2024-03-31 NOTE — Telephone Encounter (Signed)
   Pre-operative Risk Assessment    Patient Name: Morgan Wolfe  DOB: 1956-05-07 MRN: [id number]   Date of last office visit: 02/14/24 MIRIAM SHAMS, NP Date of next office visit: 09/10/24 MIRIAM SHAMS, NP  Request for Surgical Clearance    Procedure:  DENTAL CLEANING AND FILLING; PER FORM NO EXTRACTIONS TO BE DONE  Date of Surgery:  Clearance TBD                                Surgeon:  DR. JAMA, DDS Surgeon's Group or Practice Name:  [facility]  Phone number:   Fax number:  [phone]   Type of Clearance Requested:   - Medical  - Pharmacy:  Hold Aspirin  and Clopidogrel  (Plavix ) ; ALSO DOES PT NEED SBE   Type of Anesthesia:  Local    Additional requests/questions:    Signed, Reganne Messerschmidt   03/31/2024, 2:45 PM

## 2024-03-31 NOTE — Telephone Encounter (Signed)
   Patient Name: Morgan Wolfe  DOB: [birth year] MRN: [id number]  Primary Cardiologist: Alm Clay, MD  Chart reviewed as part of pre-operative protocol coverage. Simple dental extractions (i.e. 1-2 teeth) and cleanings are considered low risk procedures per guidelines and generally do not require any specific cardiac clearance. It is also generally accepted that for simple extractions and dental cleanings, there is no need to interrupt blood thinner therapy. Patient had 2 stents placed in 10/2023 so Aspirin  and Plavix  should NOT be held.   SBE prophylaxis is not required for the patient from a cardiac standpoint.  I will route this recommendation to the requesting party via Epic fax function and remove from pre-op pool.  Please call with questions.  Keyshia Orwick E Salia Cangemi, PA-C 03/31/2024, 3:08 PM

## 2024-04-27 ENCOUNTER — Ambulatory Visit (HOSPITAL_BASED_OUTPATIENT_CLINIC_OR_DEPARTMENT_OTHER)
Admission: RE | Admit: 2024-04-27 | Discharge: 2024-04-27 | Disposition: A | Discharge status: Home / Self Care | Source: Ambulatory Visit | Attending: Family Medicine | Admitting: Family Medicine

## 2024-04-27 DIAGNOSIS — Z780 Asymptomatic menopausal state: Secondary | ICD-10-CM | POA: Diagnosis present

## 2024-08-17 ENCOUNTER — Ambulatory Visit: Admitting: Emergency Medicine
